# Patient Record
Sex: Female | Born: 1965 | Race: White | Hispanic: No | Marital: Married | Smoking: Never smoker
Health system: Southern US, Community
[De-identification: ages and names within clinical notes are randomized; demographics above are authoritative.]

## PROBLEM LIST (undated history)

## (undated) DIAGNOSIS — E119 Type 2 diabetes mellitus without complications: Principal | ICD-10-CM

## (undated) DIAGNOSIS — Z79890 Hormone replacement therapy (postmenopausal): Secondary | ICD-10-CM

## (undated) DIAGNOSIS — I10 Essential (primary) hypertension: Secondary | ICD-10-CM

## (undated) DIAGNOSIS — M47814 Spondylosis without myelopathy or radiculopathy, thoracic region: Principal | ICD-10-CM

## (undated) DIAGNOSIS — N3281 Overactive bladder: Secondary | ICD-10-CM

## (undated) DIAGNOSIS — E785 Hyperlipidemia, unspecified: Secondary | ICD-10-CM

## (undated) DIAGNOSIS — J45909 Unspecified asthma, uncomplicated: Secondary | ICD-10-CM

## (undated) DIAGNOSIS — T7840XA Allergy, unspecified, initial encounter: Secondary | ICD-10-CM

## (undated) DIAGNOSIS — M797 Fibromyalgia: Secondary | ICD-10-CM

## (undated) DIAGNOSIS — Z1231 Encounter for screening mammogram for malignant neoplasm of breast: Secondary | ICD-10-CM

## (undated) DIAGNOSIS — M546 Pain in thoracic spine: Principal | ICD-10-CM

## (undated) DIAGNOSIS — Z899 Acquired absence of limb, unspecified: Secondary | ICD-10-CM

## (undated) DIAGNOSIS — R32 Unspecified urinary incontinence: Secondary | ICD-10-CM

## (undated) DIAGNOSIS — G709 Myoneural disorder, unspecified: Secondary | ICD-10-CM

## (undated) DIAGNOSIS — R112 Nausea with vomiting, unspecified: Secondary | ICD-10-CM

## (undated) DIAGNOSIS — E7800 Pure hypercholesterolemia, unspecified: Secondary | ICD-10-CM

## (undated) DIAGNOSIS — Z89511 Acquired absence of right leg below knee: Secondary | ICD-10-CM

## (undated) DIAGNOSIS — Z9289 Personal history of other medical treatment: Secondary | ICD-10-CM

## (undated) DIAGNOSIS — Z9889 Other specified postprocedural states: Secondary | ICD-10-CM

## (undated) DIAGNOSIS — F419 Anxiety disorder, unspecified: Secondary | ICD-10-CM

## (undated) DIAGNOSIS — J9819 Other pulmonary collapse: Secondary | ICD-10-CM

## (undated) DIAGNOSIS — T884XXA Failed or difficult intubation, initial encounter: Secondary | ICD-10-CM

## (undated) DIAGNOSIS — M1990 Unspecified osteoarthritis, unspecified site: Secondary | ICD-10-CM

## (undated) DIAGNOSIS — J189 Pneumonia, unspecified organism: Secondary | ICD-10-CM

## (undated) DIAGNOSIS — C801 Malignant (primary) neoplasm, unspecified: Secondary | ICD-10-CM

## (undated) DIAGNOSIS — F32A Depression, unspecified: Secondary | ICD-10-CM

## (undated) DIAGNOSIS — K219 Gastro-esophageal reflux disease without esophagitis: Secondary | ICD-10-CM

## (undated) DIAGNOSIS — K449 Diaphragmatic hernia without obstruction or gangrene: Secondary | ICD-10-CM

## (undated) DIAGNOSIS — Z860100 Personal history of colon polyps, unspecified: Secondary | ICD-10-CM

## (undated) DIAGNOSIS — K760 Fatty (change of) liver, not elsewhere classified: Secondary | ICD-10-CM

## (undated) DIAGNOSIS — D649 Anemia, unspecified: Secondary | ICD-10-CM

## (undated) HISTORY — PX: IVC FILTER INSERTION: CATH118245

## (undated) HISTORY — DX: Personal history of colon polyps, unspecified: Z86.0100

## (undated) HISTORY — DX: Unspecified osteoarthritis, unspecified site: M19.90

## (undated) HISTORY — DX: Essential (primary) hypertension: I10

## (undated) HISTORY — DX: Gastro-esophageal reflux disease without esophagitis: K21.9

## (undated) HISTORY — PX: TOTAL KNEE ARTHROPLASTY: SHX125

## (undated) HISTORY — DX: Type 2 diabetes mellitus without complications: E11.9

## (undated) HISTORY — DX: Unspecified asthma, uncomplicated: J45.909

## (undated) HISTORY — DX: Anemia, unspecified: D64.9

## (undated) HISTORY — DX: Diaphragmatic hernia without obstruction or gangrene: K44.9

## (undated) HISTORY — DX: Pure hypercholesterolemia, unspecified: E78.00

## (undated) HISTORY — DX: Personal history of colonic polyps: Z86.010

## (undated) HISTORY — DX: Depression, unspecified: F32.A

---

## 2001-10-26 DIAGNOSIS — I2699 Other pulmonary embolism without acute cor pulmonale: Secondary | ICD-10-CM

## 2001-10-26 HISTORY — DX: Other pulmonary embolism without acute cor pulmonale: I26.99

## 2002-08-27 HISTORY — PX: LEG AMPUTATION BELOW KNEE: SHX694

## 2002-08-27 HISTORY — PX: ORIF ANKLE FRACTURE: SUR919

## 2010-07-22 NOTE — Telephone Encounter (Signed)
Pt needs all med rfs. [facility] [place] except-Neurontin, K+, and nexium.

## 2010-07-23 MED ORDER — SIMVASTATIN 20 MG PO TABS
20 MG | ORAL_TABLET | Freq: Every evening | ORAL | Status: DC
Start: 2010-07-23 — End: 2010-11-03

## 2010-07-23 MED ORDER — OXYBUTYNIN CHLORIDE 5 MG PO TABS
5 MG | ORAL_TABLET | Freq: Two times a day (BID) | ORAL | Status: DC
Start: 2010-07-23 — End: 2011-01-25

## 2010-07-23 MED ORDER — METFORMIN HCL 1000 MG PO TABS
1000 MG | ORAL_TABLET | Freq: Two times a day (BID) | ORAL | Status: DC
Start: 2010-07-23 — End: 2011-02-04

## 2010-07-23 MED ORDER — GABAPENTIN 600 MG PO TABS
600 MG | ORAL_TABLET | ORAL | Status: DC
Start: 2010-07-23 — End: 2011-06-01

## 2010-07-23 MED ORDER — LISINOPRIL-HYDROCHLOROTHIAZIDE 20-25 MG PO TABS
20-25 MG | ORAL_TABLET | Freq: Every day | ORAL | Status: DC
Start: 2010-07-23 — End: 2010-11-03

## 2010-07-23 MED ORDER — FUROSEMIDE 20 MG PO TABS
20 MG | ORAL_TABLET | Freq: Two times a day (BID) | ORAL | Status: DC
Start: 2010-07-23 — End: 2010-12-29

## 2010-07-23 MED ORDER — POTASSIUM CHLORIDE 10 MEQ PO TBCR
10 MEQ | ORAL_TABLET | Freq: Every day | ORAL | Status: DC
Start: 2010-07-23 — End: 2011-02-09

## 2010-07-23 MED ORDER — MELOXICAM 15 MG PO TABS
15 MG | ORAL_TABLET | Freq: Every day | ORAL | Status: DC
Start: 2010-07-23 — End: 2010-12-29

## 2010-07-23 MED ORDER — ESOMEPRAZOLE MAGNESIUM 40 MG PO CPDR
40 MG | ORAL_CAPSULE | Freq: Every day | ORAL | Status: DC
Start: 2010-07-23 — End: 2011-02-04

## 2010-07-23 MED ORDER — PROGESTERONE MICRONIZED 100 MG PO CAPS
100 MG | ORAL_CAPSULE | ORAL | Status: DC
Start: 2010-07-23 — End: 2010-09-24

## 2010-08-11 ENCOUNTER — Encounter

## 2010-08-11 MED ORDER — LORATADINE 10 MG PO TABS
10 MG | ORAL_TABLET | Freq: Every day | ORAL | Status: DC
Start: 2010-08-11 — End: 2011-02-04

## 2010-09-24 ENCOUNTER — Encounter

## 2010-09-24 NOTE — Telephone Encounter (Signed)
Pt needs trazodone and prom[facility]rium [facility]  The rx for only #12 of prom[facility]rium [facility] [place].

## 2010-09-25 MED ORDER — PROGESTERONE MICRONIZED 100 MG PO CAPS
100 MG | ORAL_CAPSULE | ORAL | Status: DC
Start: 2010-09-25 — End: 2010-12-29

## 2010-09-25 MED ORDER — TRAZODONE HCL 150 MG PO TABS
150 MG | ORAL_TABLET | Freq: Every evening | ORAL | Status: DC
Start: 2010-09-25 — End: 2014-01-04

## 2010-09-25 NOTE — Telephone Encounter (Signed)
Unable to contact pt. Her answering machine is off.  [facility]

## 2010-09-30 ENCOUNTER — Encounter

## 2010-09-30 NOTE — Progress Notes (Signed)
Pt called for mam order to be mailed to her home.  Order mailed and phone # to schedule enclosed.

## 2010-10-26 HISTORY — PX: ROTATOR CUFF REPAIR: SHX139

## 2010-11-03 ENCOUNTER — Encounter

## 2010-11-03 MED ORDER — SIMVASTATIN 20 MG PO TABS
20 MG | ORAL_TABLET | Freq: Every evening | ORAL | Status: DC
Start: 2010-11-03 — End: 2011-02-10

## 2010-11-03 MED ORDER — LISINOPRIL-HYDROCHLOROTHIAZIDE 20-25 MG PO TABS
20-25 MG | ORAL_TABLET | Freq: Every day | ORAL | Status: DC
Start: 2010-11-03 — End: 2011-06-01

## 2010-11-03 NOTE — Telephone Encounter (Signed)
Please fax to [facility],call patient when done

## 2010-11-03 NOTE — Telephone Encounter (Signed)
Marchelle Folks [facility]ed to [facility]

## 2010-11-10 NOTE — Telephone Encounter (Signed)
Tried to call pt and advised that we received a fax from [facility] stating that her Nexium that is currently rx has other alternatives that may be cheaper for her.  Scanned a copy of letter in system.

## 2010-11-26 NOTE — Telephone Encounter (Signed)
Message copied by Ursula AlertGREGORY, MICHELLE on Wed Nov 26, 2010  4:50 PM  ------       Message from: Barrett HenleGOLDSMITH, SHANNON       Created: Tue Nov 25, 2010 11:41 AM         Notify normal.  Follow up in 1 year or sooner if issue

## 2010-11-26 NOTE — Telephone Encounter (Signed)
Pt aware of xray results

## 2010-12-29 ENCOUNTER — Encounter

## 2010-12-29 MED ORDER — FUROSEMIDE 20 MG PO TABS
20 MG | ORAL_TABLET | Freq: Two times a day (BID) | ORAL | Status: DC
Start: 2010-12-29 — End: 2011-02-04

## 2010-12-29 MED ORDER — PROGESTERONE MICRONIZED 100 MG PO CAPS
100 MG | ORAL_CAPSULE | ORAL | Status: DC
Start: 2010-12-29 — End: 2011-02-04

## 2010-12-29 MED ORDER — MELOXICAM 15 MG PO TABS
15 MG | ORAL_TABLET | Freq: Every day | ORAL | Status: DC
Start: 2010-12-29 — End: 2011-02-04

## 2011-01-14 MED ORDER — HYDROCORTISONE 2.5 % RE CREA
2.5 % | RECTAL | Status: DC
Start: 2011-01-14 — End: 2011-02-04

## 2011-01-14 NOTE — Telephone Encounter (Signed)
Pt called and stated that she has a hemorrhoid that is bleeding and she wants to know if there is something that you can call in to help heal it.  ([facility])

## 2011-01-14 NOTE — Telephone Encounter (Signed)
Sent over cream that can be applied to outer rectum.  Patient needs to keep stools lose with extra fiber and water or colace (OTC stool softner) to avoid straining which will increase bleeding.  Needs to go to ER if losing a lot of blood.

## 2011-01-14 NOTE — Telephone Encounter (Signed)
Pt aware of note below, will do all of those things.

## 2011-01-25 ENCOUNTER — Encounter

## 2011-01-26 MED ORDER — OXYBUTYNIN CHLORIDE 5 MG PO TABS
5 MG | ORAL_TABLET | ORAL | Status: DC
Start: 2011-01-26 — End: 2011-02-04

## 2011-02-04 LAB — COMPREHENSIVE METABOLIC PANEL
ALT: 51 U/L (ref 0–63)
AST: 46 U/L — ABNORMAL HIGH (ref 0–35)
Albumin: 4.5 g/dL (ref 3.5–5.2)
Alk Phosphatase: 60 U/L (ref 40–135)
Anion Gap: 8 mEq/L (ref 7–13)
BUN: 17 mg/dL (ref 10–26)
CO2: 33 mEq/L — ABNORMAL HIGH (ref 22–32)
Calcium: 9.7 mg/dL (ref 8.5–10.2)
Chloride: 102 mEq/L (ref 99–111)
Creatinine: 1.1 mg/dL (ref 0.50–1.10)
GFR African American: 64.4
GFR Non-African American: 53.2
Globulin: 2.5 g/dL (ref 2.3–3.5)
Glucose: 123 mg/dL — ABNORMAL HIGH (ref 60–115)
Potassium: 4.4 mEq/L (ref 3.5–5.5)
Sodium: 143 mEq/L (ref 135–146)
Total Bilirubin: 0.3 mg/dL (ref 0.2–1.1)
Total Protein: 7 g/dL (ref 6.4–8.1)

## 2011-02-04 LAB — CBC WITH DIFFERENTIAL
Basophils %: 0.1 % (ref 0.0–2.0)
Basophils Absolute: 0 10*3/uL (ref 0.0–0.2)
Eosinophils %: 2.5 % (ref 0.0–4.0)
Eosinophils Absolute: 0.2 10*3/uL (ref 0.0–0.7)
Granulocyte Absolute Count: 4.3 10*3/uL (ref 1.4–6.5)
Granulocytes, Relative Percent: 60.2 % (ref 42.2–75.2)
HCT: 40.8 % (ref 37.0–47.0)
Hemoglobin: 13.8 g/dL (ref 12.0–16.0)
Lymphocytes %: 30.2 % (ref 20.5–51.1)
Lymphocytes Absolute: 2.1 10*3/uL (ref 1.0–4.8)
MCH: 32.3 pg — ABNORMAL HIGH (ref 27.0–31.0)
MCHC: 33.7 % (ref 33.0–37.0)
MCV: 95.7 fL (ref 82.0–100.0)
MPV: 8.2 fL (ref 7.4–10.4)
Monocytes %: 7 % (ref 2.0–11.0)
Monocytes Absolute: 0.5 10*3/uL (ref 0.2–0.8)
Platelets: 302 10*3/uL (ref 130–400)
RBC: 4.26 10*6/uL (ref 4.20–5.40)
RDW: 12.8 % (ref 11.5–14.5)
WBC: 7.1 10*3/uL (ref 4.8–10.8)

## 2011-02-04 LAB — LIPID PANEL
Chol/HDL Ratio: 4.3
Cholesterol: 166 mg/dL (ref 0–199)
HDL: 39 mg/dL — ABNORMAL LOW (ref 40–59)
LDL Calculated: 95 mg/dL (ref 0–129)
LDL/HDL Ratio: 2.4 (ref 0.0–2.9)
Triglycerides: 202 mg/dL — ABNORMAL HIGH (ref 00–149)

## 2011-02-04 LAB — TSH, HIGH SENSITIVE: TSH: 1.464 u[IU]/mL (ref 0.550–4.780)

## 2011-02-04 MED ORDER — MELOXICAM 15 MG PO TABS
15 MG | ORAL_TABLET | Freq: Every day | ORAL | Status: DC
Start: 2011-02-04 — End: 2011-06-01

## 2011-02-04 MED ORDER — TRAZODONE HCL 150 MG PO TABS
150 MG | ORAL_TABLET | Freq: Every evening | ORAL | Status: DC
Start: 2011-02-04 — End: 2012-02-01

## 2011-02-04 MED ORDER — PROGESTERONE MICRONIZED 100 MG PO CAPS
100 MG | ORAL_CAPSULE | ORAL | Status: DC
Start: 2011-02-04 — End: 2011-08-31

## 2011-02-04 MED ORDER — METFORMIN HCL 1000 MG PO TABS
1000 MG | ORAL_TABLET | Freq: Two times a day (BID) | ORAL | Status: DC
Start: 2011-02-04 — End: 2011-06-01

## 2011-02-04 MED ORDER — ESOMEPRAZOLE MAGNESIUM 40 MG PO CPDR
40 MG | ORAL_CAPSULE | Freq: Every day | ORAL | Status: DC
Start: 2011-02-04 — End: 2011-06-01

## 2011-02-04 MED ORDER — FUROSEMIDE 20 MG PO TABS
20 MG | ORAL_TABLET | Freq: Two times a day (BID) | ORAL | Status: DC
Start: 2011-02-04 — End: 2011-06-01

## 2011-02-04 MED ORDER — LORATADINE 10 MG PO TABS
10 MG | ORAL_TABLET | Freq: Every day | ORAL | Status: DC
Start: 2011-02-04 — End: 2011-09-21

## 2011-02-04 MED ORDER — OXYBUTYNIN CHLORIDE 5 MG PO TABS
5 MG | ORAL_TABLET | Freq: Two times a day (BID) | ORAL | Status: DC
Start: 2011-02-04 — End: 2011-04-26

## 2011-02-04 NOTE — Progress Notes (Signed)
Subjective:      Patient ID: Jenny Stephens is a 45 y.o. female.    Gynecologic Exam  The patient's pertinent negatives include no genital itching, genital lesions, genital odor, genital rash, pelvic pain, vaginal bleeding or vaginal discharge. Primary symptoms comment: here for routine pap She is not pregnant. Pertinent negatives include no chills, diarrhea, fever, rash or vomiting. She is sexually active. No, her partner does not have an STD. She uses vasectomy for contraception. Her menstrual history has been irregular. Her past medical history is significant for ovarian cysts. There is no history of a gynecological surgery, miscarriage or an STD.       Review of Systems   Constitutional: Negative for fever and chills.   Cardiovascular: Negative for chest pain.   Gastrointestinal: Negative for vomiting and diarrhea.   Genitourinary: Positive for enuresis. Negative for vaginal discharge, genital sores, menstrual problem and pelvic pain.   Skin: Negative for rash and wound.       Objective:   Physical Exam   [vitalsreviewed.  Constitutional: She is oriented to person, place, and time. She appears well-developed and well-nourished. No distress.   Pulmonary/Chest: Right breast exhibits no inverted nipple, no mass and no tenderness. Left breast exhibits no inverted nipple, no mass and no tenderness.   Genitourinary: Vagina normal. No breast swelling, tenderness, discharge or bleeding. Pelvic exam was performed with patient supine. There is no rash or lesion on the right labia. There is no rash or lesion on the left labia. Cervix exhibits no motion tenderness and no discharge.        Unable to palpate ovaries d/t body habitus   Neurological: She is alert and oriented to person, place, and time.       Assessment:      1. Pap smear for cervical cancer screening    2. Urinary incontinence    3. Osteoarthritis    4. Postmenopausal HRT (hormone replacement therapy)    5. HTN (hypertension)    6. Environmental allergies     7. DM type 2 (diabetes mellitus, type 2)            Plan:      Pap obtained and sent to lab  Mammogram done within the past year and normal  Patient declines cultures for STI's  Refills on chronic medications given  Routine labs and lab to check glucose done today

## 2011-02-05 LAB — HEMOGLOBIN A1C: Hemoglobin A1C: 6.4 %HbA1c — ABNORMAL HIGH (ref 4.2–5.9)

## 2011-02-09 ENCOUNTER — Encounter

## 2011-02-09 MED ORDER — POTASSIUM CHLORIDE 10 MEQ PO TBCR
10 MEQ | ORAL_TABLET | Freq: Every day | ORAL | Status: DC
Start: 2011-02-09 — End: 2011-08-31

## 2011-02-09 MED ORDER — POTASSIUM CHLORIDE 10 MEQ PO TBCR
10 MEQ | ORAL_TABLET | Freq: Every day | ORAL | Status: DC
Start: 2011-02-09 — End: 2011-02-09

## 2011-02-09 NOTE — Telephone Encounter (Signed)
Pt rx sent electronically and can not for mail order.

## 2011-02-09 NOTE — Telephone Encounter (Signed)
Pt request Klor-Con RX to be called in to [facility]: [phone]

## 2011-02-09 NOTE — Telephone Encounter (Signed)
Addended by: Ursula AlertGREGORY, MICHELLE on: 02/09/2011 11:14 AM     Modules accepted: Orders

## 2011-02-10 ENCOUNTER — Telehealth

## 2011-02-10 MED ORDER — SIMVASTATIN 40 MG PO TABS
40 MG | ORAL_TABLET | Freq: Every evening | ORAL | Status: DC
Start: 2011-02-10 — End: 2011-06-30

## 2011-02-10 NOTE — Telephone Encounter (Signed)
Pt aware of blood work results.  She wants to increase Zocor.  Will need to [facility]

## 2011-02-10 NOTE — Telephone Encounter (Signed)
Pt aware that pap is normal, repeat in 1 yr

## 2011-02-10 NOTE — Telephone Encounter (Signed)
Message copied by Ursula AlertGREGORY, MICHELLE on Tue Feb 10, 2011  1:50 PM  ------       Message from: Barrett HenleGOLDSMITH, SHANNON       Created: Mon Feb 09, 2011  7:59 AM         Notify patient that A1c is stable at 6.4.  Cholesterol is somewhat improved, however triglycerides are elevated.  Needs to improve diet if can otherwise can consider increasing Zocor.  Remaining labs ok

## 2011-02-10 NOTE — Telephone Encounter (Signed)
Message copied by Ursula AlertGREGORY, MICHELLE on Tue Feb 10, 2011  1:55 PM  ------       Message from: Barrett HenleGOLDSMITH, SHANNON       Created: Mon Feb 09, 2011  4:20 PM         Notify patient that pap is ok

## 2011-03-09 ENCOUNTER — Encounter

## 2011-03-09 MED ORDER — ALBUTEROL SULFATE HFA 108 (90 BASE) MCG/ACT IN AERS
108 (90 Base) MCG/ACT | Freq: Four times a day (QID) | RESPIRATORY_TRACT | Status: DC | PRN
Start: 2011-03-09 — End: 2012-02-10

## 2011-03-09 MED ORDER — EPINEPHRINE 0.3 MG/0.3ML IJ DEVI
0.3 MG/ML | Freq: Once | INTRAMUSCULAR | Status: DC | PRN
Start: 2011-03-09 — End: 2012-02-10

## 2011-04-26 ENCOUNTER — Encounter

## 2011-04-30 MED ORDER — OXYBUTYNIN CHLORIDE 5 MG PO TABS
5 MG | ORAL_TABLET | ORAL | Status: DC
Start: 2011-04-30 — End: 2011-08-31

## 2011-06-01 ENCOUNTER — Encounter

## 2011-06-01 MED ORDER — MELOXICAM 15 MG PO TABS
15 MG | ORAL_TABLET | Freq: Every day | ORAL | Status: DC
Start: 2011-06-01 — End: 2011-08-09

## 2011-06-01 MED ORDER — LISINOPRIL-HYDROCHLOROTHIAZIDE 20-25 MG PO TABS
20-25 MG | ORAL_TABLET | Freq: Every day | ORAL | Status: DC
Start: 2011-06-01 — End: 2012-02-10

## 2011-06-01 MED ORDER — GABAPENTIN 600 MG PO TABS
600 MG | ORAL_TABLET | ORAL | Status: DC
Start: 2011-06-01 — End: 2012-01-25

## 2011-06-01 MED ORDER — METFORMIN HCL 1000 MG PO TABS
1000 MG | ORAL_TABLET | Freq: Two times a day (BID) | ORAL | Status: DC
Start: 2011-06-01 — End: 2012-02-10

## 2011-06-01 MED ORDER — ESOMEPRAZOLE MAGNESIUM 40 MG PO CPDR
40 MG | ORAL_CAPSULE | Freq: Every day | ORAL | Status: DC
Start: 2011-06-01 — End: 2012-02-10

## 2011-06-01 MED ORDER — FUROSEMIDE 20 MG PO TABS
20 MG | ORAL_TABLET | Freq: Two times a day (BID) | ORAL | Status: DC
Start: 2011-06-01 — End: 2012-02-10

## 2011-06-01 NOTE — Telephone Encounter (Signed)
Pt aware that [facility]

## 2011-06-30 ENCOUNTER — Encounter

## 2011-06-30 MED ORDER — SIMVASTATIN 40 MG PO TABS
40 MG | ORAL_TABLET | Freq: Every evening | ORAL | Status: DC
Start: 2011-06-30 — End: 2012-02-10

## 2011-08-09 ENCOUNTER — Encounter

## 2011-08-10 MED ORDER — MELOXICAM 15 MG PO TABS
15 MG | ORAL_TABLET | ORAL | Status: DC
Start: 2011-08-10 — End: 2012-01-11

## 2011-08-10 NOTE — Telephone Encounter (Signed)
Printed out

## 2011-08-31 ENCOUNTER — Encounter

## 2011-08-31 MED ORDER — POTASSIUM CHLORIDE 10 MEQ PO TBCR
10 MEQ | ORAL_TABLET | Freq: Every day | ORAL | Status: DC
Start: 2011-08-31 — End: 2012-02-12

## 2011-08-31 MED ORDER — PROGESTERONE MICRONIZED 100 MG PO CAPS
100 MG | ORAL_CAPSULE | ORAL | Status: DC
Start: 2011-08-31 — End: 2012-02-10

## 2011-08-31 MED ORDER — OXYBUTYNIN CHLORIDE 5 MG PO TABS
5 MG | ORAL_TABLET | Freq: Two times a day (BID) | ORAL | Status: DC
Start: 2011-08-31 — End: 2012-02-01

## 2011-08-31 NOTE — Telephone Encounter (Signed)
Needs faxed

## 2011-08-31 NOTE — Telephone Encounter (Signed)
Potassium and Progesterone [facility]  Pt needs Oxybutynin sent to [facility].

## 2011-09-21 ENCOUNTER — Encounter

## 2011-09-21 MED ORDER — LORATADINE 10 MG PO TABS
10 MG | ORAL_TABLET | Freq: Every day | ORAL | Status: DC
Start: 2011-09-21 — End: 2012-04-10

## 2011-10-13 NOTE — Telephone Encounter (Signed)
Patient already has refills on requested Rx.

## 2011-11-17 ENCOUNTER — Encounter

## 2011-11-17 NOTE — Progress Notes (Signed)
Pt requesting an order for mammogram. She received a letter from [facility] that stated she was due.

## 2011-12-24 NOTE — Telephone Encounter (Signed)
Message copied by Hermenia Fiscal on Thu Dec 24, 2011  4:58 PM  ------       Message from: Barrett Henle       Created: Thu Dec 24, 2011  4:41 PM         Notify patient that mammogram is normal.  ------

## 2011-12-24 NOTE — Telephone Encounter (Signed)
Pt aware of results.

## 2012-01-06 MED ORDER — NABUMETONE 500 MG PO TABS
500 MG | ORAL_TABLET | Freq: Two times a day (BID) | ORAL | Status: DC
Start: 2012-01-06 — End: 2012-02-17

## 2012-01-06 NOTE — Telephone Encounter (Signed)
Is she still taking the Mobic?  Is she only looking for something stronger for prn use or is she looking for something different for everyday?

## 2012-01-06 NOTE — Telephone Encounter (Signed)
Stop mobic and new rx sent.

## 2012-01-06 NOTE — Telephone Encounter (Addendum)
Pt says she is looking for something different that she can use every day. She still is taking Mobic. [facility] [place] She would like to have 2 refills.

## 2012-01-06 NOTE — Telephone Encounter (Signed)
Pt aware,

## 2012-01-06 NOTE — Telephone Encounter (Signed)
Patient is having a lot of pain due to her arthritis, believes it may be due to the weather.  She was on Celebrex in the past but that stopped working after a while.

## 2012-01-11 ENCOUNTER — Encounter

## 2012-01-11 MED ORDER — MELOXICAM 15 MG PO TABS
15 MG | ORAL_TABLET | Freq: Every day | ORAL | Status: DC
Start: 2012-01-11 — End: 2012-02-10

## 2012-01-11 NOTE — Telephone Encounter (Signed)
Pt aware.

## 2012-01-11 NOTE — Telephone Encounter (Signed)
Ok stop relafen, new rx sent

## 2012-01-11 NOTE — Telephone Encounter (Signed)
Patient did not have relief with Relafen, so she is back on Cymbalta 30mg  per Dr. Stacie Acres & would like to resume the Mobic 15mg  a day.  She states she is having good pain relief with this combination.

## 2012-01-25 MED ORDER — GABAPENTIN 600 MG PO TABS
600 MG | ORAL_TABLET | ORAL | Status: DC
Start: 2012-01-25 — End: 2012-02-10

## 2012-02-01 ENCOUNTER — Encounter

## 2012-02-01 MED ORDER — OXYBUTYNIN CHLORIDE 5 MG PO TABS
5 MG | ORAL_TABLET | Freq: Two times a day (BID) | ORAL | Status: DC
Start: 2012-02-01 — End: 2012-02-10

## 2012-02-01 MED ORDER — TRAZODONE HCL 150 MG PO TABS
150 MG | ORAL_TABLET | Freq: Every evening | ORAL | Status: DC
Start: 2012-02-01 — End: 2012-09-07

## 2012-02-01 NOTE — Telephone Encounter (Signed)
Pt had to cancel her pap appt.  She states that [facility] will only pay for pap every 3 years.  She was going to get these refills at her pap appt.

## 2012-02-10 MED ORDER — AZITHROMYCIN 500 MG PO TABS
500 MG | ORAL_TABLET | Freq: Every day | ORAL | Status: AC
Start: 2012-02-10 — End: 2012-02-15

## 2012-02-10 NOTE — Telephone Encounter (Signed)
Advised pt of below.

## 2012-02-10 NOTE — Telephone Encounter (Signed)
Antibiotic sent, please apologize.  Her chart got locked by computer problems so wasn't sent earlier

## 2012-02-10 NOTE — Telephone Encounter (Signed)
Jenny Stephens called and wanted to know if you were going to send over any medication for her ears.

## 2012-02-10 NOTE — Progress Notes (Signed)
Subjective:      Patient ID: Jenny Stephens is a 46 y.o. female.    HPI Comments: Jenny Stephens is here today for medication refills; some go to [facility] and others go to [facility]. She'd also like to have her left ear looked at. She states it has felt clogged for the last 3 weeks. She hasn't tried anything other than the antihistamine she takes. She's also going to need blood work done. Her periods have "been all out of whack" and would like to have a pre-menopausal test done.     CC: diabetes, ear pain, period issues    Ear Fullness   There is pain in the left ear. This is a new problem. The current episode started 1 to 4 weeks ago. The problem occurs constantly. The problem has been unchanged. There has been no fever. Associated symptoms include drainage, hearing loss and rhinorrhea. Pertinent negatives include no coughing, ear discharge, headaches, neck pain, rash or vomiting. Treatments tried: antihistamine. The treatment provided no relief. There is no history of a chronic ear infection.     Treatment Adherence:   Medication compliance:  compliant all of the time  Diet compliance:  compliant most of the time  Weight trend: decreasing  Current exercise: limited but improving as prosthesis is getting better  What might prevent you from meeting your goal?: impairment:  physical: amputee, financial, stress and time constraints  Patient plan for overcoming barriers: N/A     Patient Confidence: 9/10      Diabetes Mellitus Type 2: Current symptoms/problems include neuropathy.    Home blood sugar records:  patient does not test  Any episodes of hypoglycemia? no  Eye exam current (within one year): yes  Tobacco history: She  reports that she has never smoked. She has never used smokeless tobacco.   Daily Aspirin? No: not indicated  Known diabetic complications: peripheral neuropathy    Hypertension:  Home blood pressure monitoring: No.  She is adherent to a low sodium diet. Patient denies chest pain, shortness of breath and  headache.  Antihypertensive medication side effects: no medication side effects noted.  Use of agents associated with hypertension: NSAIDS.   Does have peripheral edema.    Hyperlipidemia:  No new myalgias or GI upset on simvastatin (Zocor).       Lab Results   Component Value Date    LABA1C 6.4* 02/10/2012    LABA1C 6.4* 02/04/2011     Lab Results   Component Value Date    LABMICR 1.0 02/10/2012    CREATININE 0.88 02/10/2012     Lab Results   Component Value Date    ALT 41 02/10/2012    AST 40* 02/10/2012     Lab Results   Component Value Date    CHOL 140 02/10/2012    TRIG 126 02/10/2012    HDL 47 02/10/2012    LDLCALC 68 02/10/2012        Feels that hormones are of out order.  Has missed several menses.  Takes progesterone b/c of endometriosis.    Past Medical History   Diagnosis Date   . Diabetes mellitus    . Depression    . Osteoarthritis    . RAD (reactive airway disease)    . Hyperlipidemia    . GERD (gastroesophageal reflux disease)    . Allergic rhinitis    . Hypertension    . Rosacea    . Embolism - blood clot 2004     pe   .  Fibromyalgia    . Muscle pain      axial skeletal   . Overactive bladder    . PCOS (polycystic ovarian syndrome)        Review of Systems   Constitutional: Negative for fever and chills.   HENT: Positive for hearing loss, ear pain, congestion and rhinorrhea. Negative for neck pain and ear discharge.    Respiratory: Negative for cough and shortness of breath.    Cardiovascular: Positive for leg swelling. Negative for chest pain and palpitations.   Gastrointestinal: Negative for nausea and vomiting.   Genitourinary: Positive for menstrual problem. Negative for dysuria.   Musculoskeletal: Positive for myalgias, arthralgias and gait problem.   Skin: Negative for rash and wound.   Neurological: Negative for dizziness and headaches.   Psychiatric/Behavioral: Positive for dysphoric mood. The patient is nervous/anxious.        Objective:   Physical Exam   Nursing note and vitals  reviewed.  Constitutional: She is oriented to person, place, and time. She appears well-developed and well-nourished. No distress.   HENT:   Head: Normocephalic and atraumatic.   Right Ear: Tympanic membrane normal.   Left Ear: Tympanic membrane is erythematous and bulging. A middle ear effusion is present.   Nose: Nose normal.   Mouth/Throat: No oropharyngeal exudate or posterior oropharyngeal edema.   Neck: Normal range of motion. Neck supple.   Cardiovascular: Normal rate, regular rhythm and normal heart sounds.    No murmur heard.  Pulmonary/Chest: Effort normal and breath sounds normal. No respiratory distress. She has no wheezes.   Musculoskeletal: She exhibits edema (left leg).        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   Lymphadenopathy:     She has no cervical adenopathy.   Neurological: She is alert and oriented to person, place, and time.   Skin: Skin is warm and dry. No rash noted.   Psychiatric: She has a normal mood and affect. Her behavior is normal.       Assessment:      1. Diabetes mellitus    2. HTN (hypertension)    3. Otitis media    4. Irregular menses    5. Other and unspecified hyperlipidemia    6. Acid reflux    7. Overactive bladder    8. Fibromyalgia    9. Asthma             Plan:      Will draw extensive labs today  Refills given  Will call when resulted  Rx for zpak for ear  Will check hormone labs   Encouraged continued weight loss  Follow up with prosthesis   Diabetes Counseling   Patient was counseled regarding disease risks and adopting healthy behaviors. Patient was provided education materials to assist with self management. Patient was provided log (or received log during previous visit) to record blood pressure, food intake and/or blood sugar. Patient was instructed to keep log up-to-date and to always bring log to all office visits.

## 2012-02-11 LAB — CBC WITH DIFFERENTIAL
Basophils %: 0.3 %
Basophils Absolute: 0 10*3/uL (ref 0.0–0.2)
Eosinophils %: 1.7 %
Eosinophils Absolute: 0.1 10*3/uL (ref 0.0–0.7)
Hematocrit: 40.3 % (ref 37.0–47.0)
Hemoglobin: 13.3 g/dL (ref 12.0–16.0)
Lymphocytes %: 26.7 %
Lymphocytes Absolute: 2 10*3/uL (ref 1.0–4.8)
MCH: 32.1 pg — ABNORMAL HIGH (ref 27.0–31.3)
MCHC: 32.9 % — ABNORMAL LOW (ref 33.0–37.0)
MCV: 97.6 fL (ref 82.0–100.0)
MPV: 8.2 fL (ref 7.4–10.4)
Monocytes %: 9 %
Monocytes Absolute: 0.7 10*3/uL (ref 0.2–0.8)
Neutrophils %: 62.3 %
Neutrophils Absolute: 4.7 10*3/uL (ref 1.4–6.5)
Platelets: 283 10*3/uL (ref 130–400)
RBC: 4.13 M/uL — ABNORMAL LOW (ref 4.20–5.40)
RDW: 13.4 % (ref 11.5–14.5)
WBC: 7.6 10*3/uL (ref 4.8–10.8)

## 2012-02-11 LAB — COMPREHENSIVE METABOLIC PANEL
ALT: 41 U/L (ref 0–63)
AST: 40 U/L — ABNORMAL HIGH (ref 0–35)
Albumin: 4.3 g/dL (ref 3.5–5.2)
Alkaline Phosphatase: 61 U/L (ref 40–135)
Anion Gap: 12 mEq/L (ref 7–13)
BUN: 15 mg/dL (ref 10–26)
CO2: 32 mEq/L (ref 22–32)
Calcium: 9.4 mg/dL (ref 8.5–10.2)
Chloride: 101 mEq/L (ref 99–111)
Creatinine: 0.88 mg/dL (ref 0.50–1.10)
GFR African American: >60 (ref 60.0–999.0)
GFR Non-African American: >60 (ref >60)
Globulin: 2.3 g/dL (ref 2.3–3.5)
Glucose: 109 mg/dL (ref 60–115)
Potassium: 3.6 mEq/L (ref 3.5–5.5)
Sodium: 145 mEq/L (ref 135–146)
Total Bilirubin: 0.4 mg/dL (ref 0.2–1.1)
Total Protein: 6.6 g/dL (ref 6.4–8.1)

## 2012-02-11 LAB — LIPID PANEL
Cholesterol, Total: 140 mg/dL (ref 0–199)
HDL: 47 mg/dL (ref 40–59)
LDL Calculated: 68 mg/dL
Triglycerides: 126 mg/dL (ref 0–149)

## 2012-02-11 LAB — MICROALBUMIN / CREATININE URINE RATIO
Creatinine, Ur: 272.2 mg/dL
Microalbumin Creatinine Ratio: 3.7 mg/G (ref 0.0–30.0)
Microalbumin, Random Urine: 1 mg/dL

## 2012-02-11 LAB — LUTEINIZING HORMONE: LH: 53 m[IU]/mL

## 2012-02-11 LAB — TSH, HIGH SENSITIVE: TSH: 1.066 u[IU]/mL (ref 0.550–4.780)

## 2012-02-11 LAB — FOLLICLE STIMULATING HORMONE: FSH: 28.6 m[IU]/mL

## 2012-02-11 LAB — HEMOGLOBIN A1C: Hemoglobin A1C: 6.4 % — ABNORMAL HIGH (ref 4.2–5.9)

## 2012-02-11 MED ORDER — GABAPENTIN 600 MG PO TABS
600 MG | ORAL_TABLET | ORAL | Status: DC
Start: 2012-02-11 — End: 2012-12-19

## 2012-02-11 MED ORDER — SIMVASTATIN 40 MG PO TABS
40 MG | ORAL_TABLET | Freq: Every evening | ORAL | Status: DC
Start: 2012-02-11 — End: 2012-10-10

## 2012-02-11 MED ORDER — LISINOPRIL-HYDROCHLOROTHIAZIDE 20-25 MG PO TABS
20-25 MG | ORAL_TABLET | Freq: Every day | ORAL | Status: DC
Start: 2012-02-11 — End: 2012-12-19

## 2012-02-11 MED ORDER — FUROSEMIDE 20 MG PO TABS
20 MG | ORAL_TABLET | Freq: Two times a day (BID) | ORAL | Status: DC
Start: 2012-02-11 — End: 2012-10-10

## 2012-02-11 MED ORDER — ESOMEPRAZOLE MAGNESIUM 40 MG PO CPDR
40 MG | ORAL_CAPSULE | Freq: Every day | ORAL | Status: DC
Start: 2012-02-11 — End: 2012-12-19

## 2012-02-11 MED ORDER — OXYBUTYNIN CHLORIDE 5 MG PO TABS
5 MG | ORAL_TABLET | Freq: Two times a day (BID) | ORAL | Status: DC
Start: 2012-02-11 — End: 2014-03-02

## 2012-02-11 MED ORDER — METFORMIN HCL 1000 MG PO TABS
1000 MG | ORAL_TABLET | Freq: Two times a day (BID) | ORAL | Status: DC
Start: 2012-02-11 — End: 2012-11-14

## 2012-02-11 MED ORDER — ALBUTEROL SULFATE HFA 108 (90 BASE) MCG/ACT IN AERS
108 (90 Base) MCG/ACT | Freq: Four times a day (QID) | RESPIRATORY_TRACT | Status: DC | PRN
Start: 2012-02-11 — End: 2012-12-19

## 2012-02-11 MED ORDER — PROGESTERONE MICRONIZED 100 MG PO CAPS
100 MG | ORAL_CAPSULE | ORAL | Status: DC
Start: 2012-02-11 — End: 2012-12-19

## 2012-02-11 MED ORDER — MELOXICAM 15 MG PO TABS
15 MG | ORAL_TABLET | Freq: Every day | ORAL | Status: DC
Start: 2012-02-11 — End: 2012-07-17

## 2012-02-11 MED ORDER — EPINEPHRINE 0.3 MG/0.3ML IJ DEVI
0.3 MG/ML | Freq: Once | INTRAMUSCULAR | Status: DC | PRN
Start: 2012-02-11 — End: 2012-12-19

## 2012-02-11 NOTE — Telephone Encounter (Signed)
Left message with husband for pt to call office when pt available.

## 2012-02-11 NOTE — Telephone Encounter (Signed)
If no menses for 4-6 months can then try without the progesterone  Potassium is 3.6 which is toward the low end of normal.  Would recommend continue with potassium, does she want to try the liquid or stick with the pills?

## 2012-02-11 NOTE — Telephone Encounter (Signed)
Patient called back & results given.  Patient would like to know how long she needs to monitor menses.  Also would like to know if she needs to continue taking the Potassium.

## 2012-02-11 NOTE — Telephone Encounter (Signed)
Message copied by Alona Bene on Thu Feb 11, 2012  9:01 AM  ------       Message from: Barrett Henle       Created: Thu Feb 11, 2012  7:50 AM         Notify pt that labs look good.  Cholesterol showed good improvement from last year.  A1c is still 6.4 which is good.  Hormone levels do appear to be in the start of the menopause range.  She will need to monitor her menses.  In the next few months may not need the progesterone anymore.  ------

## 2012-02-12 MED ORDER — POTASSIUM CHLORIDE 20 MEQ/15ML (10%) PO LIQD
20 MEQ/15ML (10%) | Freq: Every day | ORAL | Status: DC
Start: 2012-02-12 — End: 2013-04-07

## 2012-02-12 NOTE — Telephone Encounter (Signed)
Pt would like to go with the liquid potassium, send to [facility] [place]

## 2012-02-12 NOTE — Assessment & Plan Note (Signed)
Decrease weight by 5 lbs in next 3 months

## 2012-02-12 NOTE — Telephone Encounter (Signed)
Left VM for patient regarding below information.  Asked patient to call back regarding which Potassium she would like to take.

## 2012-02-12 NOTE — Patient Instructions (Signed)
Diabetes Management    Blood Sugar Log  Day Breakfast Lunch Dinner    Before 2 hrs After Before 2 hrs After Before 2 hrs After    Time Number Time  Number Time Number Time Number Time  Number Time Number   Sun               Mon               Tues               Wed               Thurs               Fri               Sat                 1. Keep your blood sugar level (A1c) below 7.0%    If your blood sugar is ABOVE 7.0%, get an A1c test every 3 months.     If your blood sugar is BELOW 7.0%, get an A1c test every 6 months.    2. Keep your blood pressure below 130/80    Make sure your blood pressure is measured at every office visit    3. Obtain a foot exam once a year during your doctor???s visit; also, do foot exam on yourself every week or have someone do it for you     4. Keep your LDL (bad) cholesterol level below 100    Make sure your lipids are measured once a year     5. Obtain a urine test (microalbumin) once a year    6. Obtain an eye exam once a year    7. Learn to stay healthy living with Diabetes    Attend Diabetes Education course if ordered by your physician    8. Exercise and slowly progress to (brisk walking, bike riding, etc) 30 minutes 3 to 5 days a week.    9. Obtain annual flu shot    10. Obtain pneumonia shot if you have not done so  11. Refer to patient education handouts given to you today    Diabetes Management Community Resources   Organization Address Program Phone / Website   [facility] [address] [place] [address] Diabetes Self-Management  [phone]   [facility] [address] [place], [place] Diabetes Self-Management  [phone]   [facility] [place] [address] [place], [place]  Diabetes Self-Management  [phone]   [facility] [address] [place] [address] Help with Food [phone]   Partnership for Prescription Assistance n/a Medications [phone]

## 2012-02-16 MED ORDER — CLARITHROMYCIN 250 MG PO TABS
250 MG | ORAL_TABLET | Freq: Two times a day (BID) | ORAL | Status: DC
Start: 2012-02-16 — End: 2012-02-17

## 2012-02-16 NOTE — Telephone Encounter (Signed)
Pt called and stated that she is worse, she is having B/L ear pain and sore throat.  She finished all the Azithromycin that she was given.   Can she have another RX for something different?

## 2012-02-16 NOTE — Telephone Encounter (Signed)
clarithyromycin sent to pharmacy

## 2012-02-16 NOTE — Telephone Encounter (Signed)
Pt aware

## 2012-02-17 LAB — POCT RAPID STREP A: Strep A Ag: NOT DETECTED

## 2012-02-17 MED ORDER — NYSTATIN 100000 UNIT/ML MT SUSP
100000 UNIT/ML | Freq: Four times a day (QID) | OROMUCOSAL | Status: AC
Start: 2012-02-17 — End: 2012-02-24

## 2012-02-17 NOTE — Progress Notes (Signed)
Subjective:      Patient ID: Jenny Stephens is a 46 y.o. female.    HPI Comments: Sore throat - pt thinks that it is possible Thrush, symptoms include cough and sinus drainage.  Current issue has been the past 2-3 days.  Pt has not tried anything for her symptoms.     CC: sore throat  Pharyngitis   This is a new problem. The current episode started in the past 7 days. The problem has been gradually worsening. There has been no fever. The pain is moderate. Associated symptoms include congestion, ear pain, swollen glands and trouble swallowing. Pertinent negatives include no coughing or vomiting. Treatments tried: took zpak but hasn't started clindamycin yet. The treatment provided no relief.       Review of Systems   Constitutional: Negative for fever and chills.   HENT: Positive for ear pain, congestion, mouth sores and trouble swallowing.    Respiratory: Negative for cough and wheezing.    Gastrointestinal: Negative for nausea and vomiting.       Objective:   Physical Exam   Nursing note and vitals reviewed.  Constitutional: She appears well-developed and well-nourished. No distress.   HENT:   Head: Normocephalic and atraumatic.   Right Ear: Tympanic membrane normal.   Left Ear: Tympanic membrane normal.   Nose: Nose normal.   Mouth/Throat: Oropharyngeal exudate and posterior oropharyngeal erythema present.   White fissured tongue   Cardiovascular: Normal rate, regular rhythm and normal heart sounds.    Pulmonary/Chest: Effort normal and breath sounds normal. No respiratory distress.       Assessment:      1. Sore throat    2. Thrush             Plan:      Advised to start clindamycin  Rx for nystatin   Rest, increase fluids, OTC symptomatic medications  The patient is to call or return if symptoms worsen, persist, or do not resolve.  If the patient has any worsening of symptoms after hours, they are advised to go to the nearest emergency room.

## 2012-02-18 NOTE — Patient Instructions (Signed)
Candidiasis: After Your Visit  Your Care Instructions  Candidiasis (say "kan-dih-DY-uh-sus") is a yeast infection. Yeast normally lives in your body, but it can cause health problems when your body's defenses fail. You can get a yeast infection when you take antibiotics. Antibiotics kill good bacteria that control yeast. Some medicines such as steroids and cancer drugs can also increase the chance of a yeast infection. Diseases such as AIDS or diabetes, which weaken your immune system, can allow yeast to grow rapidly.  Thrush is a yeast infection in the mouth. These infections usually occur in people with weak immune systems. They cause white patches inside the mouth and throat.  Yeast infections of the skin usually occur in folds where the skin stays moist. Babies can get yeast infections under the diaper. People who often wear gloves can get yeast infections on their hands. Yeast infections cause red, weeping patches on your skin.  Vaginal yeast infections are common in women, especially during antibiotic treatment. They cause itching, white curd-like discharge, and burning around the vagina.  Rarely, yeast infects the blood and causes serious disease. A yeast infection of the blood is treated with medicine given through a needle into a vein.  A yeast infection usually goes away quickly once treatment starts. But if your immune system is weak, the infection may return. Tell your doctor if you get yeast infections often.  Follow-up care is a key part of your treatment and safety. Be sure to make and go to all appointments, and call your doctor if you are having problems. It???s also a good idea to know your test results and keep a list of the medicines you take.  How can you care for yourself at home?  ?? Take your medicines exactly as prescribed. Call your doctor if you think you are having a problem with your medicine.  ?? Use antibiotics only as directed by your doctor.  ?? Eat yogurt with live cultures. The  bacteria (lactobacillus) in the yogurt may help prevent some types of yeast infections.  ?? Keep your skin clean and dry. Sprinkle powder on areas that tend to remain moist.  ?? Use a different method of birth control than a condom or diaphragm while treating a vaginal yeast infection with a cream or suppository. These can weaken the rubber used in diaphragms and condoms.  ?? Eat a healthy diet and get regular exercise to keep your immune system strong.  When should you call for help?  Call your doctor now or seek immediate medical care if:  ?? You have a fever.  ?? You are pregnant and have signs of a vaginal or urinary tract infection such as:  ?? Severe itching in your vagina.  ?? Pain during sex or while urinating.  ?? Unusual discharge from your vagina.  ?? A frequent urge to urinate.  ?? Urine that is cloudy or smells bad.  Watch closely for changes in your health, and be sure to contact your doctor if:  ?? You do not get better as expected.    Where can you learn more?    Go to [email or web address] and sign in to your MyChart account.    Enter N733 in the Search Health Information box to learn more about ???Candidiasis: After Your Visit.???    If you do not have an account, please click on the ???Sign Up Now??? link.    ?? 2006-2012 [employer]. Care instructions adapted under license by [facility]. This care instruction is for   use with your licensed healthcare professional. If you have questions about a medical condition or this instruction, always ask your healthcare professional. [employer] disclaims any warranty or liability for your use of this information.  Content Version: 9.4.94723; Last Revised: August 18, 2010

## 2012-03-01 MED ORDER — CYCLOBENZAPRINE HCL 10 MG PO TABS
10 MG | ORAL_TABLET | Freq: Three times a day (TID) | ORAL | Status: AC | PRN
Start: 2012-03-01 — End: 2012-03-11

## 2012-03-01 MED ORDER — ACETAMINOPHEN-CODEINE 300-30 MG PO TABS
300-30 MG | ORAL_TABLET | ORAL | Status: DC | PRN
Start: 2012-03-01 — End: 2012-09-07

## 2012-03-01 NOTE — Progress Notes (Signed)
Subjective:      Patient ID: Jenny Stephens is a 46 y.o. female.    HPI Comments: Pt is here today for back pain.  States that on May 3rd, 2013 she was getting out of the car and twisted wrong and heard a pop on the right side of her lower back.   States since then she is not able to bend over. Pain starts at the right hip area.  Pt states that the pain does seem to radiate up her right side.  Pt states that she has tried ice, Bengay, and Lidoderm patches with no relief.     Pt would like a note for work on the number of days she needs off.    CC: back pain    Back Pain  This is a new problem. The current episode started in the past 7 days. The problem occurs constantly. The problem is unchanged. The pain is present in the lumbar spine and gluteal. The quality of the pain is described as burning. The pain does not radiate. The pain is worse during the day. The symptoms are aggravated by position, bending, sitting and standing. Stiffness is present in the morning. Associated symptoms include weakness. Pertinent negatives include no bladder incontinence, bowel incontinence, fever, leg pain, numbness or tingling. Risk factors include obesity (h/o leg amputation). Treatments tried: lidoderm, bengay. The treatment provided no relief.       Review of Systems   Constitutional: Negative for fever and chills.   Gastrointestinal: Negative for diarrhea, constipation and bowel incontinence.   Genitourinary: Negative for bladder incontinence.   Musculoskeletal: Positive for myalgias, back pain and gait problem.   Skin: Negative for rash and wound.   Neurological: Positive for weakness. Negative for tingling and numbness.       Objective:   Physical Exam   Nursing note and vitals reviewed.  Constitutional: She is oriented to person, place, and time. She appears well-developed and well-nourished. No distress.   HENT:   Head: Normocephalic and atraumatic.   Musculoskeletal:        Lumbar back: She exhibits decreased range of  motion, tenderness and spasm (on right).   Right below knee amputee   Tenderness right PSIS   Neurological: She is alert and oriented to person, place, and time.   Skin: Skin is warm and dry. No rash noted.       Assessment:      1. Strain of lumbar paraspinal muscle             Plan:      Continue with mobic  Rx for flexeril prn; do not drive while using  Rx for tylenol #3 for prn use; do not drive while using  F/u with chiropractor prn   Note for off work til Saturday  The patient is to call or return if symptoms worsen, persist, or do not resolve.  If the patient has any worsening of symptoms after hours, they are advised to go to the nearest emergency room.

## 2012-03-01 NOTE — Patient Instructions (Signed)
Acute Low Back Pain: Exercises  Your Care Instructions  Here are some examples of typical rehabilitation exercises for your condition. Start each exercise slowly. Ease off the exercise if you start to have pain.  Your doctor or physical therapist will tell you when you can start these exercises and which ones will work best for you.  When you are not being active, find a comfortable position for rest. Some people are comfortable on the floor or a medium-firm bed with a small pillow under their head and another under their knees. Some people prefer to lie on their side with a pillow between their knees. Don't stay in one position for too long.  Take short walks (10 to 20 minutes) every 2 to 3 hours. Avoid slopes, hills, and stairs until you feel better. Walk only distances you can manage without pain, especially leg pain.  How to do the exercises  Back stretches    1. Get down on your hands and knees on the floor.  2. Relax your head and allow it to droop. Round your back up toward the ceiling until you feel a nice stretch in your upper, middle, and lower back. Hold this stretch for as long as it feels comfortable, or about 15 to 30 seconds.  3. Return to the starting position with a flat back while you are on your hands and knees.  4. Let your back sway by pressing your stomach toward the floor. Lift your buttocks toward the ceiling.  5. Hold this position for 15 to 30 seconds.  6. Repeat 2 to 4 times.  Follow-up care is a key part of your treatment and safety. Be sure to make and go to all appointments, and call your doctor if you are having problems. It's also a good idea to know your test results and keep a list of the medicines you take.    Where can you learn more?    Go to [email or web address] and sign in to your MyChart account.    Enter Z071 in the Search Health Information box to learn more about ???Acute Low Back Pain: Exercises.???    If you do not have an account, please click on the  ???Sign Up Now??? link.    ?? 2006-2012 [employer]. Care instructions adapted under license by [facility]. This care instruction is for use with your licensed healthcare professional. If you have questions about a medical condition or this instruction, always ask your healthcare professional. Healthwise, Incorporated disclaims any warranty or liability for your use of this information.  Content Version: 9.4.94723; Last Revised: September 09, 2010

## 2012-04-11 NOTE — Telephone Encounter (Signed)
Medication refill

## 2012-04-12 MED ORDER — LORATADINE 10 MG PO TABS
10 MG | ORAL_TABLET | ORAL | Status: DC
Start: 2012-04-12 — End: 2012-10-10

## 2012-07-18 MED ORDER — MELOXICAM 15 MG PO TABS
15 MG | ORAL_TABLET | ORAL | Status: DC
Start: 2012-07-18 — End: 2012-09-07

## 2012-07-18 NOTE — Telephone Encounter (Signed)
Pt called office requesting a refill for mobic

## 2012-07-20 MED ORDER — NABUMETONE 500 MG PO TABS
500 MG | ORAL_TABLET | Freq: Two times a day (BID) | ORAL | Status: DC
Start: 2012-07-20 — End: 2012-09-07

## 2012-07-20 NOTE — Telephone Encounter (Signed)
Left vm.

## 2012-07-20 NOTE — Telephone Encounter (Signed)
mobic does not come in stronger dose and max dose for a day is 15 mg.  Sent over different rx for relafen which can be taken 2 times a day.

## 2012-07-20 NOTE — Telephone Encounter (Signed)
Left message for pt to call office.

## 2012-07-20 NOTE — Telephone Encounter (Signed)
Pt called to ask if a prescription for mobic could be prescribed in a stronger dose than 15mg .  She was just given an rx for mobic 15mg  on 07/17/12, which she has not filled yet. If its not available in stronger dosage, pt is asking if she can take two of the mobic or can she have a different rx. Please call pt at work at the [employer] before 5 or at home afterward.

## 2012-08-15 MED ORDER — CELECOXIB 100 MG PO CAPS
100 MG | ORAL_CAPSULE | Freq: Two times a day (BID) | ORAL | Status: DC
Start: 2012-08-15 — End: 2012-08-31

## 2012-08-15 NOTE — Telephone Encounter (Signed)
Pt called office and was made aware to stop mobin and ralafin and that celabrex was called into the pharmacy

## 2012-08-15 NOTE — Telephone Encounter (Signed)
rx for celebrex  Stop mobic and stop relafen that was called in last week.

## 2012-08-15 NOTE — Telephone Encounter (Signed)
Pt called office stating the mobic was not helping her anymore and was wondering if she could try the celebrex again?

## 2012-08-31 MED ORDER — CELECOXIB 200 MG PO CAPS
200 MG | ORAL_CAPSULE | Freq: Every day | ORAL | Status: DC
Start: 2012-08-31 — End: 2012-09-07

## 2012-08-31 NOTE — Telephone Encounter (Signed)
Pt called asking if she can increase celebrex to 200mg .  She has been having increased pain and discomfort

## 2012-08-31 NOTE — Telephone Encounter (Signed)
Yes, rx sent

## 2012-08-31 NOTE — Telephone Encounter (Signed)
Pt's husband is aware.

## 2012-09-05 NOTE — Telephone Encounter (Signed)
Can take 200 mg bid if flared but routine daily dose is recommended to be 200 mg only

## 2012-09-05 NOTE — Telephone Encounter (Signed)
My mistake, pt wanted to know if she can take 200 mg twice daily.  She was already taking 100 mg twice daily.

## 2012-09-05 NOTE — Telephone Encounter (Signed)
Pt says that she is taking 200 mg celebrex bid now for 5 days and is feeling much better. She is afraid if she goes back to 200 mg daily it will not help her. Her new rx is 200 mg daily. Before she was taking 100 mg in the morning and 100 in the evening. Pt would like to know if its ok to continue taking 200 mg bid daily. She says that a former physician prescribed it for her at 200 mg bid daily.

## 2012-09-06 NOTE — Telephone Encounter (Signed)
Tried to call pt and advised below and her machine is off.  Tried to call work number and number rings busy.

## 2012-09-06 NOTE — Telephone Encounter (Signed)
200 mg bid is the absolute max she can take.  Must be on look out for signs of bleeding or blood in stool

## 2012-09-07 MED ORDER — CELECOXIB 200 MG PO CAPS
200 MG | ORAL_CAPSULE | Freq: Two times a day (BID) | ORAL | Status: DC
Start: 2012-09-07 — End: 2012-10-12

## 2012-09-07 NOTE — Progress Notes (Signed)
Subjective:      Patient ID: Jenny Stephens is a 46 y.o. female.    HPI Comments: Pt is here today to discuss her medications.  Pt states she would like to talk about the arthritis  medication.  Pt states that she looked up in a prescription pill book and it said she could take 100 to 200 mg once or twice a day.  She is not sure if the guide lines changed or not because the book is 51-68 years old.  (Celebrex)  Pt states that she was taking 200 mg bid and she feels 95 to 100% better taking the Celebrex that way.    Pt has no other questions or concerns. jb    CC: arthritis    Joint/Muscle Pain: Patient complains of arthralgias and myalgias for which has been present for several years but seem to be worse the past few months. Pain is located in multiple joints, is described as aching, throbbing and tingling, and is constant .  Associated symptoms include: decreased range of motion, deformity and tenderness.  The patient has tried celebrex for pain, with moderate relief.  Related to injury:   No.  Wants to know if can increase celebrex.  Nothing else is helping.    Review of Systems   Constitutional: Negative for fever and chills.   Cardiovascular: Negative for chest pain and leg swelling.   Musculoskeletal: Positive for myalgias, arthralgias and gait problem.   Skin: Negative for rash and wound.   Neurological: Positive for numbness. Negative for weakness.       Objective:   Physical Exam   Vitals reviewed.  Constitutional: She appears well-developed and well-nourished. No distress.   Cardiovascular: Normal rate and regular rhythm.    Pulmonary/Chest: Effort normal and breath sounds normal.   Musculoskeletal: She exhibits tenderness. She exhibits no edema.   amputee       BP 98/72   Pulse 88   Temp(Src) 98 ??F (36.7 ??C) (Oral)   Wt 268 lb (121.564 kg)   BMI 45.98 kg/m2   LMP 09/07/2012    Assessment:      1. Osteoarthritis    2. Fibromyalgia             Plan:      Rx for celebrex at max dose  Advised pt of risk of GI  bleeding, upset  Return in 3 months for CBC to monitor  The patient is to call or return if symptoms worsen, persist, or do not resolve.  If the patient has any worsening of symptoms after hours, they are advised to go to the nearest emergency room.

## 2012-09-08 NOTE — Telephone Encounter (Signed)
Pt  Came in on 09/07/2012 to discuss

## 2012-09-10 NOTE — Patient Instructions (Signed)
Arthritis: After Your Visit  Your Care Instructions  Arthritis, also called osteoarthritis, is a breakdown of the cartilage that cushions your joints. When the cartilage wears down, your bones rub against each other. This causes pain and stiffness. Many people have some arthritis as they age. Arthritis most often affects the joints of the spine, hands, hips, knees, or feet.  You can take simple measures to protect your joints, ease your pain, and help you stay active.  Follow-up care is a key part of your treatment and safety. Be sure to make and go to all appointments, and call your doctor if you are having problems. It's also a good idea to know your test results and keep a list of the medicines you take.  How can you care for yourself at home?  ?? Stay at a healthy weight. Being overweight puts extra strain on your joints.  ?? Talk to your doctor or physical therapist about exercises that will help ease joint pain.  ?? Stretch. You may enjoy gentle forms of yoga to help keep your joints and muscles flexible.  ?? Walk instead of jog. Other types of exercise that are less stressful on the joints include riding a bicycle, swimming, or water exercise.  ?? Lift weights. Strong muscles help reduce stress on your joints. Stronger thigh muscles, for example, take some of the stress off of the knees and hips. Learn the right way to lift weights so you do not make joint pain worse.  ?? Take your medicines exactly as prescribed. Call your doctor if you think you are having a problem with your medicine.  ?? Take pain medicines exactly as directed.  ?? If the doctor gave you a prescription medicine for pain, take it as prescribed.  ?? If you are not taking a prescription pain medicine, ask your doctor if you can take an over-the-counter medicine.  ?? Use a cane, crutch, walker, or another device if you need help to get around. These can help rest your joints. You also can use other things to make life easier, such as a higher toilet  seat and padded handles on kitchen utensils.  ?? Do not sit in low chairs, which can make it hard to get up.  ?? Put heat or cold on your sore joints as needed. Use whichever helps you most. You also can take turns with hot and cold packs.  ?? Apply heat 2 or 3 times a day for 20 to 30 minutes--using a heating pad, hot shower, or hot pack--to relieve pain and stiffness.  ?? Put ice or a cold pack on your sore joint for 10 to 20 minutes at a time. Put a thin cloth between the ice and your skin.  ?? Your doctor may suggest you try the supplements glucosamine and chondroitin. They are part of what makes up normal cartilage. Tell your doctor if you have diabetes, allergies to shellfish, or if you take warfarin (Coumadin).  When should you call for help?  Call your doctor now or seek immediate medical care if:  ?? You have sudden swelling, warmth, or pain in any joint.  ?? You have joint pain and a fever or rash.  ?? You have such bad pain that you cannot use a joint.  Watch closely for changes in your health, and be sure to contact your doctor if:  ?? You have mild joint symptoms that continue even with more than 6 weeks of care at home.  ?? You have stomach   pain or other problems with your medicine.    Where can you learn more?    Go to [email or web address] and sign in to your MyChart account. Enter Z807 in the Search Health Information box to learn more about ???Arthritis: After Your Visit.???    If you do not have an account, please click on the ???Sign Up Now??? link.      ?? 2006-2013 [employer], [employer]. Care instructions adapted under license by [facility]. This care instruction is for use with your licensed healthcare professional. If you have questions about a medical condition or this instruction, always ask your healthcare professional. [employer], [employer] disclaims any warranty or liability for your use of this information.  Content Version: 9.7.130178; Last Revised: February 03, 2010

## 2012-10-04 NOTE — Telephone Encounter (Signed)
Received an incoming fax for Dr Goldsmith review, fax attached to this encounter.

## 2012-10-06 ENCOUNTER — Telehealth

## 2012-10-06 NOTE — Telephone Encounter (Signed)
Error

## 2012-10-06 NOTE — Telephone Encounter (Signed)
Patient called and stated that she needs a referral to a new amputee Doctor, she [facility] Ph# is [phone] and the fax # is [phone]. She does not have an appointment scheduled yet.

## 2012-10-07 NOTE — Telephone Encounter (Signed)
Referral faxed.

## 2012-10-10 ENCOUNTER — Encounter

## 2012-10-10 MED ORDER — TRAZODONE HCL 150 MG PO TABS
150 MG | ORAL_TABLET | Freq: Every evening | ORAL | Status: DC
Start: 2012-10-10 — End: 2014-03-02

## 2012-10-10 MED ORDER — LORATADINE 10 MG PO TABS
10 MG | ORAL_TABLET | ORAL | Status: DC
Start: 2012-10-10 — End: 2013-07-04

## 2012-10-10 MED ORDER — SIMVASTATIN 40 MG PO TABS
40 MG | ORAL_TABLET | Freq: Every evening | ORAL | Status: DC
Start: 2012-10-10 — End: 2012-12-19

## 2012-10-10 MED ORDER — FUROSEMIDE 20 MG PO TABS
20 MG | ORAL_TABLET | Freq: Two times a day (BID) | ORAL | Status: DC
Start: 2012-10-10 — End: 2013-06-09

## 2012-10-10 NOTE — Telephone Encounter (Signed)
Patient called for refills on 4 of [facility] [place].

## 2012-10-10 NOTE — Telephone Encounter (Signed)
Rx sent

## 2012-10-12 MED ORDER — CELECOXIB 200 MG PO CAPS
200 MG | ORAL_CAPSULE | Freq: Two times a day (BID) | ORAL | Status: DC
Start: 2012-10-12 — End: 2013-07-04

## 2012-10-12 NOTE — Telephone Encounter (Signed)
Patient called for refill on [facility], [facility]

## 2012-10-12 NOTE — Telephone Encounter (Signed)
Rx sent

## 2012-11-14 ENCOUNTER — Encounter

## 2012-11-14 MED ORDER — METFORMIN HCL 1000 MG PO TABS
1000 MG | ORAL_TABLET | Freq: Two times a day (BID) | ORAL | Status: DC
Start: 2012-11-14 — End: 2012-12-19

## 2012-11-14 NOTE — Telephone Encounter (Signed)
Rx sent

## 2012-11-14 NOTE — Telephone Encounter (Signed)
Pt requesting refill on metformin. Send to [facility]. She made an appt for blood work.

## 2012-12-13 NOTE — Progress Notes (Signed)
Pt here for fasting blood work, she has an appt scheduled to discuss results and get med refills next Monday.

## 2012-12-14 LAB — CBC WITH DIFFERENTIAL
Basophils %: 0.7 %
Basophils Absolute: 0 10*3/uL (ref 0.0–0.2)
Eosinophils %: 2.6 %
Eosinophils Absolute: 0.2 10*3/uL (ref 0.0–0.7)
Hematocrit: 39.3 % (ref 37.0–47.0)
Hemoglobin: 12.9 g/dL (ref 12.0–16.0)
Lymphocytes %: 29.1 %
Lymphocytes Absolute: 2 10*3/uL (ref 1.0–4.8)
MCH: 30.7 pg (ref 27.0–31.3)
MCHC: 32.8 % — ABNORMAL LOW (ref 33.0–37.0)
MCV: 93.5 fL (ref 82.0–100.0)
MPV: 8.6 fL (ref 7.4–10.4)
Monocytes %: 6.7 %
Monocytes Absolute: 0.5 10*3/uL (ref 0.2–0.8)
Neutrophils %: 60.9 %
Neutrophils Absolute: 4.1 10*3/uL (ref 1.4–6.5)
Platelets: 275 10*3/uL (ref 130–400)
RBC: 4.2 M/uL (ref 4.20–5.40)
RDW: 14.1 % (ref 11.5–14.5)
WBC: 6.8 10*3/uL (ref 4.8–10.8)

## 2012-12-14 LAB — HEMOGLOBIN A1C: Hemoglobin A1C: 6.6 % — ABNORMAL HIGH (ref 4.8–5.9)

## 2012-12-14 LAB — COMPREHENSIVE METABOLIC PANEL
ALT: 35 U/L — ABNORMAL HIGH (ref 0–33)
AST: 41 U/L — ABNORMAL HIGH (ref 0–32)
Albumin: 4 g/dL (ref 3.9–4.9)
Alkaline Phosphatase: 60 U/L (ref 35–104)
Anion Gap: 13 mEq/L (ref 7–13)
BUN: 14 mg/dL (ref 6–20)
CO2: 31 mEq/L — ABNORMAL HIGH (ref 22–29)
Calcium: 9.1 mg/dL (ref 8.6–10.2)
Chloride: 95 mEq/L — ABNORMAL LOW (ref 98–107)
Creatinine: 0.74 mg/dL (ref 0.50–0.90)
GFR African American: >60 (ref >60)
GFR Non-African American: >60 (ref >60)
Globulin: 2.2 g/dL — ABNORMAL LOW (ref 2.3–3.5)
Glucose: 90 mg/dL (ref 74–109)
Potassium: 3.5 mEq/L (ref 3.5–5.1)
Sodium: 139 mEq/L (ref 136–145)
Total Bilirubin: 0.4 mg/dL (ref 0.0–1.2)
Total Protein: 6.2 g/dL — ABNORMAL LOW (ref 6.4–8.3)

## 2012-12-14 LAB — LIPID PANEL
Cholesterol, Total: 135 mg/dL (ref 0–199)
HDL: 53 mg/dL
LDL Calculated: 60 mg/dL (ref 0–129)
Triglycerides: 110 mg/dL (ref 0–200)

## 2012-12-14 NOTE — Progress Notes (Signed)
Will discuss at appt next week.

## 2012-12-19 MED ORDER — PROGESTERONE MICRONIZED 100 MG PO CAPS
100 MG | ORAL_CAPSULE | ORAL | Status: DC
Start: 2012-12-19 — End: 2013-01-23

## 2012-12-19 MED ORDER — EPINEPHRINE 0.3 MG/0.3ML IJ DEVI
0.3 MG/ML | Freq: Once | INTRAMUSCULAR | Status: DC | PRN
Start: 2012-12-19 — End: 2013-12-08

## 2012-12-19 MED ORDER — OXYBUTYNIN CHLORIDE 5 MG PO TABS
5 MG | ORAL_TABLET | Freq: Two times a day (BID) | ORAL | Status: DC
Start: 2012-12-19 — End: 2013-06-12

## 2012-12-19 MED ORDER — ESOMEPRAZOLE MAGNESIUM 40 MG PO CPDR
40 MG | ORAL_CAPSULE | Freq: Every day | ORAL | Status: DC
Start: 2012-12-19 — End: 2014-01-04

## 2012-12-19 MED ORDER — ALBUTEROL SULFATE HFA 108 (90 BASE) MCG/ACT IN AERS
108 (90 Base) MCG/ACT | Freq: Four times a day (QID) | RESPIRATORY_TRACT | Status: DC | PRN
Start: 2012-12-19 — End: 2013-12-08

## 2012-12-19 MED ORDER — METFORMIN HCL 1000 MG PO TABS
1000 MG | ORAL_TABLET | Freq: Two times a day (BID) | ORAL | Status: DC
Start: 2012-12-19 — End: 2013-05-04

## 2012-12-19 MED ORDER — GABAPENTIN 600 MG PO TABS
600 MG | ORAL_TABLET | ORAL | Status: DC
Start: 2012-12-19 — End: 2013-11-07

## 2012-12-19 MED ORDER — SIMVASTATIN 40 MG PO TABS
40 MG | ORAL_TABLET | Freq: Every evening | ORAL | Status: DC
Start: 2012-12-19 — End: 2013-06-11

## 2012-12-19 MED ORDER — LISINOPRIL-HYDROCHLOROTHIAZIDE 20-25 MG PO TABS
20-25 MG | ORAL_TABLET | Freq: Every day | ORAL | Status: DC
Start: 2012-12-19 — End: 2013-06-01

## 2012-12-19 NOTE — Progress Notes (Signed)
Subjective:      Patient ID: Jenny Stephens is a 47 y.o. female.    HPI Comments: Treatment Adherence: Diabetes, discuss labs & medication refills.    CC: diabetes, htn  Medication compliance:  compliant all of the time  Diet compliance:  compliant most of the time  Weight trend: decreasing  Current exercise: swimming  What might prevent you from meeting your goal?: none  Patient plan for overcoming barriers: N/A     Patient Confidence: 8/10      Diabetes Mellitus Type 2: Current symptoms/problems include none.    Home blood sugar records:  patient does not test  Any episodes of hypoglycemia? no  Eye exam current (within one year): yes  Tobacco history: She  reports that she has never smoked. She has never used smokeless tobacco.   Daily Aspirin? No: n/a  Known diabetic complications: none    Hypertension:  Home blood pressure monitoring: No.  She is adherent to a low sodium diet. Patient denies chest pain, shortness of breath and headache.  Antihypertensive medication side effects: no medication side effects noted.  Use of agents associated with hypertension: none.  Occasionally has dizziness in am.  Usually takes both lasix and lisinopril-hctz together first thing in the morning.    Hyperlipidemia:  No new myalgias or GI upset on simvastatin (Zocor).       LABA1C      6.6   12/13/2012  LABA1C      6.4   02/10/2012  LABA1C      6.4   02/04/2011  LABMICR         1.0   02/10/2012  CREATININE     0.74   12/13/2012  ALT       35   12/13/2012  AST       41   12/13/2012  CHOL         135   12/13/2012  TRIG         110   12/13/2012  HDL           53   12/13/2012  LDLCALC       60   12/13/2012        Review of Systems   Constitutional: Negative for fever and chills.   Respiratory: Negative for cough and shortness of breath.    Cardiovascular: Negative for chest pain and leg swelling.   Gastrointestinal: Negative for nausea and vomiting.   Musculoskeletal: Positive for arthralgias and gait problem.       Objective:   Physical Exam    Vitals reviewed.  Constitutional: She is oriented to person, place, and time. She appears well-developed and well-nourished. No distress.   Cardiovascular: Normal rate, regular rhythm and normal heart sounds.    No murmur heard.  Pulmonary/Chest: Effort normal and breath sounds normal. No respiratory distress. She has no wheezes.   Musculoskeletal: She exhibits no edema.   amputee   Neurological: She is alert and oriented to person, place, and time.       BP 112/80   Pulse 96   Temp(Src) 98.3 ??F (36.8 ??C) (Oral)   Resp 16   Wt 263 lb (119.296 kg)   BMI 45.12 kg/m2   LMP 12/12/2012    Assessment:      1. Diabetes mellitus    2. HTN (hypertension)    3. Other and unspecified hyperlipidemia    4. Asthma    5. Acid reflux    6. Overactive bladder  7. Other screening mammogram             Plan:      Reviewed abnormal labs 12-13-12  Will monitor closely increasing A1c and liver enzymes with repeat testing in 6 months  Refill current medications  Try taking lisinopril-hctz at night instead  Mammogram ordered  Diabetes Counseling   Patient was again counseled regarding disease risks and need to adopt and maintain healthy behaviors, and was provided education materials to assist with self management. These included logs to record blood pressure, caloric intake and/or blood sugar (some given at previous visits). Patient was instructed to keep logs up-to-date and to always bring logs to all office visits.

## 2012-12-19 NOTE — Patient Instructions (Signed)
Diabetes Management    Blood Sugar Log  Day Breakfast Lunch Dinner    Before 2 hrs After Before 2 hrs After Before 2 hrs After    Time Number Time  Number Time Number Time Number Time  Number Time Number   Sun               Mon               Tues               Wed               Thurs               Fri               Sat                 1. Keep your blood sugar level (A1c) below 7.0%    If your blood sugar is ABOVE 7.0%, get an A1c test every 3 months.     If your blood sugar is BELOW 7.0%, get an A1c test every 6 months.    2. Keep your blood pressure below 130/80    Make sure your blood pressure is measured at every office visit    3. Obtain a foot exam once a year during your doctor???s visit; also, do foot exam on yourself every week or have someone do it for you     4. Keep your LDL (bad) cholesterol level below 100    Make sure your lipids are measured once a year     5. Obtain a urine test (microalbumin) once a year    6. Obtain an eye exam once a year    7. Learn to stay healthy living with Diabetes    Attend Diabetes Education course if ordered by your physician    8. Exercise and slowly progress to (brisk walking, bike riding, etc) 30 minutes 3 to 5 days a week.    9. Obtain annual flu shot    10. Obtain pneumonia shot if you have not done so  11. Refer to patient education handouts given to you today    Diabetes Management Community Resources   Organization Address Program Phone / Website   [facility] [address] [place] [address] Diabetes Self-Management  [phone]   [facility] [address] [place], [place] Diabetes Self-Management  [phone]   [facility] [place] [address] [place], [place]  Diabetes Self-Management  [phone]   [facility] [address] [place] [address] Help with Food [phone]   [facility] for Prescription Assistance n/a Medications [phone]

## 2013-01-23 MED ORDER — PROGESTERONE MICRONIZED 100 MG PO CAPS
100 MG | ORAL_CAPSULE | ORAL | Status: DC
Start: 2013-01-23 — End: 2013-10-03

## 2013-01-23 NOTE — Telephone Encounter (Signed)
Rx sent

## 2013-01-23 NOTE — Telephone Encounter (Signed)
Received an incoming fax for Dr Goldsmith review, fax attached to this encounter.

## 2013-04-07 MED ORDER — LORCASERIN HCL 10 MG PO TABS
10 MG | ORAL_TABLET | ORAL | Status: DC
Start: 2013-04-07 — End: 2013-04-07

## 2013-04-07 MED ORDER — PHENTERMINE HCL 37.5 MG PO TABS
37.5 MG | ORAL_TABLET | Freq: Every day | ORAL | Status: DC
Start: 2013-04-07 — End: 2013-05-04

## 2013-04-07 NOTE — Progress Notes (Signed)
Subjective:      Patient ID: Jenny Stephens is a 47 y.o. female.    HPI Comments: Pt would like to discuss belviq to help with weight loss, lower sugar , bp and cholesterol.     Pt has no other questions or concerns. sy    CC: weight    OBESITY: Patient has concerns re: their weight.  Patient has always had weight issues.  Current diet is Moderately healthy.  Mainly drinks water.  Activity level: recently started swimming again.  Has tried alli in the past to lose weight.  Patient wondering what other options are available for weight loss.  Medical co-morbidities include: Type 2 Diabetes Mellitus, Hypertension and Hyperlipidemia.    Review of Systems   Constitutional: Negative for fever and chills.   Respiratory: Negative for cough and shortness of breath.    Cardiovascular: Negative for chest pain and palpitations.       Objective:   Physical Exam   Vitals reviewed.  Constitutional: She appears well-developed and well-nourished. No distress.   Cardiovascular: Normal rate, regular rhythm and normal heart sounds.    No murmur heard.  Pulmonary/Chest: Effort normal and breath sounds normal. No respiratory distress.       BP 112/72   Pulse 84   Temp(Src) 98.5 ??F (36.9 ??C) (Oral)   Resp 16   Ht 5\' 4"  (1.626 m)   Wt 269 lb (122.018 kg)   BMI 46.15 kg/m2   LMP 03/26/2013    Assessment:      1. Morbid obesity with BMI of 40.0-44.9, adult (HCC)             Plan:      Reviewed options  Rx for adipex  Aware of use  Understands need for diet and exercise changes  Pt to call insurance and ask about belviq  Return in 1 month

## 2013-04-19 NOTE — Telephone Encounter (Signed)
Did she start adipex like given at last appt?  I thought she was going to try adipex and then transition to qsymia?

## 2013-04-19 NOTE — Telephone Encounter (Signed)
Pt would like to get an Rx for another weight loss medication.  Did you want her to start Qysmia or Belviq?  I can get the PA start with a written Rx of whichever one.

## 2013-04-21 NOTE — Telephone Encounter (Signed)
Started Adipex while waiting for Qysmia

## 2013-04-24 MED ORDER — ONDANSETRON HCL 4 MG PO TABS
4 MG | ORAL_TABLET | Freq: Two times a day (BID) | ORAL | Status: DC | PRN
Start: 2013-04-24 — End: 2013-05-10

## 2013-04-24 NOTE — Telephone Encounter (Signed)
When I went to print I realized she has allergy to topamax in the chart.  Is she really allergic?  This is much lower dose, does she want to try anyway?

## 2013-04-24 NOTE — Telephone Encounter (Signed)
Patient is experiencing nausea and vomiting every morning, she called to see if Dr. Titus Dubin could prescribe something for the nausea and vomiting.  She also question if the metformin that she takes is cause her to vomit everyday? She is leaving to go on Vacation on Wednesday and doesn't want to be sick during the vacation.  [facility] of choice is [facility]   Please advise  SR

## 2013-04-24 NOTE — Telephone Encounter (Signed)
When did the vomiting start?  Metformin is not new, so has she been doing this ever since started this months/years ago?  Or is it with the adipex recently?  Just in the am?  Throughout the day?  Rx for zofran for as needed with nausea/vomiting sent to pharmacy

## 2013-04-25 NOTE — Telephone Encounter (Signed)
Left vm with doctors message. Asked pt to call the office.

## 2013-04-25 NOTE — Telephone Encounter (Signed)
No answer for pt

## 2013-04-26 NOTE — Telephone Encounter (Signed)
Pt states that it's not an allergy, just felt like she was "high", "out of thoughts", she has an appt 07/10 for weight check

## 2013-04-26 NOTE — Telephone Encounter (Signed)
Pt states that she has been vomiting on and off for a while, she had an appt with Dr Namon CirriGolsmith approx 3 months ago and discussed vomiting with Dr Titus DubinGoldsmith, Dr suggested maybe it was the Lisinopril so she switched to bedtime.  She hasn't taken her metformin for the last 2-3 days and has not had vomiting.  She will discuss everything with Dr Titus DubinGoldsmith on Thursday.  She will be going out of town today and return on Saturday, Sunday she will try 500 mg twice daily to see if that causes the same effect.

## 2013-05-04 LAB — POCT GLUCOSE: Glucose: 149 mg/dL

## 2013-05-04 MED ORDER — FREESTYLE SYSTEM KIT
PACK | Freq: Every day | Status: AC | PRN
Start: 2013-05-04 — End: ?

## 2013-05-04 MED ORDER — LANCETS MISC
Status: AC
Start: 2013-05-04 — End: ?

## 2013-05-04 MED ORDER — BLOOD GLUCOSE TEST VI STRP
ORAL_STRIP | Status: DC
Start: 2013-05-04 — End: 2013-07-21

## 2013-05-04 MED ORDER — PHENTERMINE HCL 37.5 MG PO TABS
37.5 MG | ORAL_TABLET | Freq: Every day | ORAL | Status: DC
Start: 2013-05-04 — End: 2013-06-01

## 2013-05-04 NOTE — Progress Notes (Signed)
Subjective:      Patient ID: Jenny Stephens is a 47 y.o. female.    HPI Comments: Pt is here today for weight loss.  Pt has lost 17# since her last visit.  Pt states that she has been watching her diet and taking the adipex.   This will be pt's 2nd refill.      Pt did state that she decreased her metformin to 500 mg bid daily.  Pt did start taking the zofran for nausea and it has helped her.      Pt has no other questions or concerns.jb    CC: weight, nausea/sugars    Pt here following 1st month of adipex.  She has lost 17 lbs.  Says quit working.  Really changed diet, eating lots of veggies and fruits.  Drinking lots of water.  Says she has been swimming regularly.  No known side effects except possibly nausea.  She wondered if could be from metformin too.  Has been using zofran with significant relief.  Cut back on metformin to try to help as well.  Has no way to monitor sugar.  No light headed.  Wonders if really needs as much metformin anyway with weight loss and diet changes.  No bowel changes or abdominal pains.    Review of Systems   Constitutional: Negative for fever and chills.   Respiratory: Negative for cough and shortness of breath.    Cardiovascular: Negative for chest pain and palpitations.   Gastrointestinal: Positive for nausea and vomiting. Negative for abdominal pain and constipation.   Neurological: Negative for dizziness and light-headedness.       Objective:   Physical Exam   Vitals reviewed.  Constitutional: She appears well-developed and well-nourished. No distress.   Weight down 17 lbs   Cardiovascular: Normal rate, regular rhythm and normal heart sounds.    No murmur heard.  Pulmonary/Chest: Effort normal and breath sounds normal. No respiratory distress.   Psychiatric: She has a normal mood and affect. Her behavior is normal.       BP 110/72   Pulse 100   Temp(Src) 98.5 ??F (36.9 ??C) (Oral)   Resp 20   Wt 252 lb (114.306 kg)   BMI 43.23 kg/m2   LMP 03/26/2013    Assessment:      1. Morbid  obesity with BMI of 40.0-44.9, adult (HCC)    2. Diabetes mellitus             Plan:      Refill adipex  Continue with healthy diet and exercise changes  Ok to remain on lower dose of metformin for now  Rx for glucometer and test strips  Return to in 1 month for recheck

## 2013-05-05 NOTE — Patient Instructions (Signed)
Body Mass Index: After Your Visit  Your Care Instructions  Body mass index (BMI) can help you see if your weight is raising your risk for health problems. It uses a formula to compare how much you weigh with how tall you are. A BMI between 18.5 and 24.9 is considered healthy. A BMI between 25 and 29.9 is considered overweight. A BMI of 30 or higher is considered obese.  If your BMI is in the normal range, it means that you have a lower risk for weight-related health problems. If your BMI is in the overweight or obese range, you may be at increased risk for weight-related health problems, such as high blood pressure, heart disease, stroke, arthritis or joint pain, and diabetes.  BMI is just one measure of your risk for weight-related health problems. You may be at higher risk for health problems if you are not active, you eat an unhealthy diet, or you drink too much alcohol or use tobacco products.  Follow-up care is a key part of your treatment and safety. Be sure to make and go to all appointments, and call your doctor if you are having problems. It???s also a good idea to know your test results and keep a list of the medicines you take.  How can you care for yourself at home?  ?? Practice healthy eating habits. This includes eating plenty of fruits, vegetables, whole grains, lean protein, and low-fat dairy.  ?? Get at least 30 minutes of exercise 4 to 5 days a week or more. Brisk walking is a good choice. You also may want to do other activities, such as running, swimming, cycling, or playing tennis or team sports.  ?? Do not smoke. Smoking can increase your risk for health problems. If you need help quitting, talk to your doctor about stop-smoking programs and medicines. These can increase your chances of quitting for good.  ?? Limit alcohol to 2 drinks a day for men and 1 drink a day for women. Too much alcohol can cause health problems.  If you have a BMI higher than 25  ?? Your doctor may do other tests to check  your risk for weight-related health problems. This may include measuring the distance around your waist. A waist measurement of more than 40 inches in men or 35 inches in women can increase the risk of weight-related health problems.  ?? Talk with your doctor about steps you can take to stay healthy or improve your health. You may need to make lifestyle changes to lose weight and stay healthy, such as changing your diet and getting regular exercise.   Where can you learn more?   Go to [email or web address] and sign in to your MyChart account. Enter S176 in the Search Health Information box to learn more about ???Body Mass Index: After Your Visit.???    If you do not have an account, please click on the ???Sign Up Now??? link.     ?? 2006-2014 [employer]. Care instructions adapted under license by [facility]. This care instruction is for use with your licensed healthcare professional. If you have questions about a medical condition or this instruction, always ask your healthcare professional. [employer], Inc[employer] disclaims any warranty or liability for your use of this information.  Content Version: 10.0.273164; Last Revised: September 09, 2012

## 2013-05-10 MED ORDER — ONDANSETRON HCL 4 MG PO TABS
4 MG | ORAL_TABLET | Freq: Two times a day (BID) | ORAL | Status: DC | PRN
Start: 2013-05-10 — End: 2013-06-01

## 2013-05-10 NOTE — Telephone Encounter (Signed)
Rx sent

## 2013-05-10 NOTE — Telephone Encounter (Signed)
Pt called and requests a refill of this medication.      JC

## 2013-05-18 MED ORDER — PROCHLORPERAZINE MALEATE 5 MG PO TABS
5 MG | ORAL_TABLET | Freq: Three times a day (TID) | ORAL | Status: DC | PRN
Start: 2013-05-18 — End: 2013-06-12

## 2013-05-18 NOTE — Telephone Encounter (Signed)
I am confused b/c they paid for it previously?  A few things; first printed off rx for compazine to use as needed but more importantly I want her to try going without the medication.  I am still not sure I understand why she is getting so nauseated.  If she is nauseated again after stopping medication we probably need to do some more tests to see why she is feeling like this.

## 2013-05-18 NOTE — Telephone Encounter (Signed)
Pt insurance company will not approve the Zofran Rx, what is another Rx she can take?

## 2013-05-19 NOTE — Telephone Encounter (Signed)
I spoke with pt and she is aware to try going with out the Compazine medication to see if she still becomes nauseated. She has a follow up OV on 06/02/13 with Dr. Titus Dubin. Compazine Rx faxed over to her pharmacy.

## 2013-06-01 MED ORDER — PHENTERMINE HCL 37.5 MG PO TABS
37.5 MG | ORAL_TABLET | Freq: Every day | ORAL | Status: AC
Start: 2013-06-01 — End: 2013-07-01

## 2013-06-01 MED ORDER — LISINOPRIL 10 MG PO TABS
10 MG | ORAL_TABLET | Freq: Every day | ORAL | Status: DC
Start: 2013-06-01 — End: 2013-07-04

## 2013-06-01 NOTE — Progress Notes (Signed)
Subjective:      Patient ID: Jenny Stephens is a 47 y.o. female.    HPI Comments: .Treatment Adherence: Pt is here is today for her weight in.  Pt has lost 13# since her last visit.  Pt needs her A1c checked and her liver enzymes today.     CC: weight, diabetes, vomiting  Medication compliance:  compliant all of the time  Diet compliance:  compliant all of the time  Weight trend: decreasing  Current exercise: swimming   What might prevent you from meeting your goal?: none  Patient plan for overcoming barriers: loosing weight     Patient Confidence: 10/10      Diabetes Mellitus Type 2: Current symptoms/problems include vomiting.    Home blood sugar records:  patient does not test  Any episodes of hypoglycemia? no  Eye exam current (within one year): yes  Tobacco history: She  reports that she has quit smoking. She has never used smokeless tobacco.   Daily Aspirin? No: n/a  Known diabetic complications: none    Hypertension:  Home blood pressure monitoring: No.  She is adherent to a low sodium diet. Patient complains of lightheadedness.  Antihypertensive medication side effects: no medication side effects noted and orthostatic lightheadedness.  Use of agents associated with hypertension: none.     Hyperlipidemia:  No new myalgias or GI upset on simvastatin (Zocor).       LABA1C      6.6   12/13/2012  LABA1C      6.4   02/10/2012  LABA1C      6.4   02/04/2011  LABMICR         1.0   02/10/2012  CREATININE     0.74   12/13/2012  ALT       35   12/13/2012  AST       41   12/13/2012  CHOL         135   12/13/2012  TRIG         110   12/13/2012  HDL           53   12/13/2012  LDLCALC       60   12/13/2012    Says this will be her 3rd month of adipex.  Has done very well.  No side effects.  Exercising.  Watching what she is eating.    Says still vomiting after eating in the morning.  Says will take pills and eat breakfast.  Then <1 hour later throws up.  Doesn't really eat much lunch and then ok by dinner.  Says compazine resolves  symptoms.  Denies change in bowels.  Denies abdominal pains.  Under a lot of stress with her son who is addicted to heroin.    Past Medical History   Diagnosis Date   ??? Diabetes mellitus (HCC)    ??? Depression    ??? Osteoarthritis    ??? RAD (reactive airway disease)    ??? Hyperlipidemia    ??? GERD (gastroesophageal reflux disease)    ??? Allergic rhinitis    ??? Hypertension    ??? Rosacea    ??? Embolism - blood clot 2004     pe   ??? Fibromyalgia    ??? Muscle pain      axial skeletal   ??? Overactive bladder    ??? PCOS (polycystic ovarian syndrome)    ??? Decreased energy        Review of Systems  Constitutional: Negative for fever and chills.   Respiratory: Negative for cough and shortness of breath.    Cardiovascular: Negative for chest pain, palpitations and leg swelling.   Gastrointestinal: Positive for vomiting. Negative for abdominal pain, diarrhea and constipation.   Genitourinary: Negative for dysuria and difficulty urinating.   Musculoskeletal: Positive for arthralgias and gait problem.   Skin: Negative for rash and wound.   Neurological: Positive for dizziness and light-headedness.   Psychiatric/Behavioral: Positive for dysphoric mood. The patient is nervous/anxious.        Objective:   Physical Exam   Constitutional: She is oriented to person, place, and time. She appears well-developed and well-nourished. No distress.   HENT:   Head: Normocephalic and atraumatic.   Nose: Nose normal.   Mouth/Throat: Oropharynx is clear and moist.   Neck: Normal range of motion. Neck supple.   Cardiovascular: Normal rate, regular rhythm and normal heart sounds.    No murmur heard.  Pulmonary/Chest: Effort normal and breath sounds normal. No respiratory distress.   Musculoskeletal: She exhibits no edema.   Stump right lower leg   Lymphadenopathy:     She has no cervical adenopathy.   Neurological: She is alert and oriented to person, place, and time.   Psychiatric: Her speech is normal. She exhibits a depressed mood.   Tearful    Vitals  reviewed.      BP 90/54   Pulse 84   Temp(Src) 98.2 ??F (36.8 ??C) (Oral)   Resp 16   Wt 239 lb (108.41 kg)   BMI 41 kg/m2    Assessment:      1. Obesity    2. Vomiting    3. Type II or unspecified type diabetes mellitus without mention of complication, not stated as uncontrolled    4. Elevated liver enzymes             Plan:      Will refill adipex for last month  Continue with good diet and exercise changes  Return next month if desires to transition to qsymia  Will check GB US with vomiting and weight loss  Continue with compazine prn  A1c today  If even lower can try d/c metformin  bp low   D/c lisinopril-hctz and start just 10 mg of lisinopril  Repeat liver enzymes today  Diabetes Counseling   Patient was again counseled regarding disease risks and need to adopt and maintain healthy behaviors, and was provided education materials to assist with self management. These included logs to record blood pressure, caloric intake and/or blood sugar (some given at previous visits). Patient was instructed to keep logs up-to-date and to always bring logs to all office visits.

## 2013-06-01 NOTE — Patient Instructions (Signed)
Diabetes Management    Blood Sugar Log  Day Breakfast Lunch Dinner    Before 2 hrs After Before 2 hrs After Before 2 hrs After    Time Number Time  Number Time Number Time Number Time  Number Time Number   Sun               Mon               Tues               Wed               Thurs               Fri               Sat                 1. Keep your blood sugar level (A1c) below 7.0%  ??? If your blood sugar is ABOVE 7.0%, get an A1c test every 3 months.   ??? If your blood sugar is BELOW 7.0%, get an A1c test every 6 months.    2. Keep your blood pressure below 130/80  ??? Make sure your blood pressure is measured at every office visit    3. Obtain a foot exam once a year during your doctor???s visit; also, do foot exam on yourself every week or have someone do it for you     4. Keep your LDL (bad) cholesterol level below 100  ??? Make sure your lipids are measured once a year     5. Obtain a urine test (microalbumin) once a year    6. Obtain an eye exam once a year    7. Learn to stay healthy living with Diabetes  ??? Attend Diabetes Education course if ordered by your physician    8. Exercise and slowly progress to (brisk walking, bike riding, etc) 30 minutes 3 to 5 days a week.    9. Obtain annual flu shot    10. Obtain pneumonia shot if you have not done so  11. Refer to patient education handouts given to you today    Diabetes Management Community Resources   Organization Address Program Phone / Website   [facility] [address] [place] [address] Diabetes Self-Management  [phone]   [facility] [address] [place], [place] Diabetes Self-Management  [phone]   [facility] [place] [address] [place], [place]  Diabetes Self-Management  [phone]   [facility] [address] [place] [address] Help with Food [phone]   [facility] for Prescription Assistance n/a Medications [phone]

## 2013-06-02 LAB — COMPREHENSIVE METABOLIC PANEL
ALT: 40 U/L — ABNORMAL HIGH (ref 0–33)
AST: 46 U/L — ABNORMAL HIGH (ref 0–35)
Albumin: 3.9 g/dL (ref 3.9–4.9)
Alkaline Phosphatase: 57 U/L (ref 40–130)
Anion Gap: 13 mEq/L (ref 7–13)
BUN: 13 mg/dL (ref 6–20)
CO2: 31 mEq/L — ABNORMAL HIGH (ref 22–29)
Calcium: 9 mg/dL (ref 8.6–10.2)
Chloride: 92 mEq/L — ABNORMAL LOW (ref 98–107)
Creatinine: 1.12 mg/dL — ABNORMAL HIGH (ref 0.50–0.90)
GFR African American: >60 (ref >60)
GFR Non-African American: 54.9 — ABNORMAL LOW (ref >60)
Globulin: 2.2 g/dL — ABNORMAL LOW (ref 2.3–3.5)
Glucose: 88 mg/dL (ref 74–109)
Potassium: 3.1 mEq/L — ABNORMAL LOW (ref 3.5–5.1)
Sodium: 136 mEq/L (ref 132–144)
Total Bilirubin: 0.3 mg/dL (ref 0.0–1.2)
Total Protein: 6.1 g/dL — ABNORMAL LOW (ref 6.4–8.1)

## 2013-06-02 LAB — HEMOGLOBIN A1C: Hemoglobin A1C: 6.5 % — ABNORMAL HIGH (ref 4.8–5.9)

## 2013-06-06 MED ORDER — POTASSIUM CHLORIDE CRYS ER 20 MEQ PO TBCR
20 MEQ | ORAL_TABLET | Freq: Every day | ORAL | Status: DC
Start: 2013-06-06 — End: 2013-08-03

## 2013-06-06 NOTE — Telephone Encounter (Signed)
Rx for potassium sent to local pharmacy, liver enzymes are essentially unchanged.  Has she ever been checked for hepatitis (although highly doubt) given son's history.       Pt is aware of results. States she has been taking OTC potassium but is finding the pills in her stool, she is open to the idea of Rx potassium. Pt states she will try not taking the metformin and seeing how it goes. Pt also was asking about her liver function. JC

## 2013-06-07 NOTE — Telephone Encounter (Signed)
No answer.

## 2013-06-09 MED ORDER — FUROSEMIDE 40 MG PO TABS
40 MG | ORAL_TABLET | Freq: Every day | ORAL | Status: DC
Start: 2013-06-09 — End: 2013-07-04

## 2013-06-09 NOTE — Telephone Encounter (Signed)
Pt states that previously she was taking lisinopril HCTZ 20/25 and takes 2-20 mg lasix. She would like to know what dr would recommend. Should she go back on lisinopril, or should she take 3-20 mg lasix, or is there a stronger mg lasix that could be prescribed? If lisinopril is recommended, she will need rx.

## 2013-06-09 NOTE — Telephone Encounter (Signed)
Left message for pt with all directions below

## 2013-06-09 NOTE — Telephone Encounter (Signed)
Pt called and stated that since Dr Titus DubinGoldsmith lowered her dose of water pill she is experiencing swelling in her left ankle and calf more so than before the Rx was changed.. Would it be possible to go back to the higher dose?   JC

## 2013-06-09 NOTE — Telephone Encounter (Signed)
Pt aware rx sent to pharmacy, has picked up and is taking. She states she has never been checked for hepatitis and is willing if dr wants her to do so.

## 2013-06-09 NOTE — Telephone Encounter (Signed)
Continue with lisinopril plain  Sent over a higher dose of lasix   Needs to make sure taking potassium  Will recheck potassium when comes next month for appt

## 2013-06-09 NOTE — Telephone Encounter (Signed)
Yes she can go back to how was before but monitor bp  Also there is another open encounter for this patient

## 2013-06-11 ENCOUNTER — Encounter

## 2013-06-12 MED ORDER — SIMVASTATIN 40 MG PO TABS
40 MG | ORAL_TABLET | ORAL | Status: DC
Start: 2013-06-12 — End: 2013-07-04

## 2013-06-12 MED ORDER — OXYBUTYNIN CHLORIDE 5 MG PO TABS
5 MG | ORAL_TABLET | Freq: Two times a day (BID) | ORAL | Status: DC
Start: 2013-06-12 — End: 2013-07-04

## 2013-06-12 MED ORDER — PROCHLORPERAZINE MALEATE 5 MG PO TABS
5 MG | ORAL_TABLET | Freq: Three times a day (TID) | ORAL | Status: DC | PRN
Start: 2013-06-12 — End: 2013-07-04

## 2013-06-12 NOTE — Telephone Encounter (Signed)
Pt was last seen by Dr Titus DubinGoldsmith 05/2013   Pt last blood work was 11/2012

## 2013-06-12 NOTE — Telephone Encounter (Signed)
Rx sent

## 2013-06-12 NOTE — Telephone Encounter (Signed)
Error, Rx refills already taken care of per Henderson CloudMichelle   JC

## 2013-06-22 NOTE — Progress Notes (Signed)
Pt son dx with mono, she wanted labs drawn also to check her.

## 2013-06-24 LAB — EPSTEIN BARR VIRUS (EBV) ANTIBODY PANEL I
EBV Early Ag Ab: 15.5 U/mL — ABNORMAL HIGH (ref 0.0–10.9)
EBV VCA,IgG: 204 U/mL — ABNORMAL HIGH (ref 0.0–21.9)
EBV VCA,IgM: <10 U/mL (ref 0.0–43.9)
Epstein Barr virus nuclear Ab IgG: 95.3 U/mL — ABNORMAL HIGH (ref 0.0–21.9)

## 2013-06-27 ENCOUNTER — Telehealth

## 2013-06-27 NOTE — Telephone Encounter (Signed)
I guess it depends.  How is her swelling with just the lasix.  If swelling is fine but bp is a little higher then double up on lisinopril 10 mg (take 2 to make 20 mg) and monitor bp.  If swelling is not good then yes can restart lisinopril-hctz

## 2013-06-27 NOTE — Telephone Encounter (Signed)
-----   Message from Barrett Henle, DO sent at 06/27/2013  7:32 AM EDT -----  Notify pt that looks like she has had past mono infection but nothing acute b/c IgM is negative.

## 2013-06-27 NOTE — Telephone Encounter (Signed)
130/86, 127/84, 136/86, 130/89, changed lisinopril, pt afraid blood pressure going back up, pt was advised to stop Lisinopril 20/25 and metformin.   Should she restart the 20/25 Lisinopril?  If so she will need a new RX called in.  She is aware of the blood work results

## 2013-06-28 MED ORDER — LISINOPRIL-HYDROCHLOROTHIAZIDE 20-25 MG PO TABS
20-25 MG | ORAL_TABLET | Freq: Every day | ORAL | Status: DC
Start: 2013-06-28 — End: 2013-11-07

## 2013-06-28 NOTE — Telephone Encounter (Signed)
Rx sent to [facility]

## 2013-06-28 NOTE — Telephone Encounter (Signed)
error 

## 2013-06-28 NOTE — Telephone Encounter (Signed)
Pt called in today, She stated that the swelling is ok, but only because she has taken more lasix that she is supposed to. She feels that the lisinopril-hctz will help, because she stated it worked well in the past.

## 2013-06-29 NOTE — Telephone Encounter (Signed)
Left VM advising the pt that an Rx had been sent in for her

## 2013-07-04 MED ORDER — PHENTERMINE-TOPIRAMATE 3.75-23 MG PO CP24
ORAL_CAPSULE | ORAL | Status: DC
Start: 2013-07-04 — End: 2013-08-03

## 2013-07-04 MED ORDER — FUROSEMIDE 40 MG PO TABS
40 MG | ORAL_TABLET | Freq: Every day | ORAL | Status: DC
Start: 2013-07-04 — End: 2013-11-07

## 2013-07-04 MED ORDER — SIMVASTATIN 40 MG PO TABS
40 MG | ORAL_TABLET | Freq: Every evening | ORAL | Status: DC
Start: 2013-07-04 — End: 2014-06-26

## 2013-07-04 MED ORDER — OXYBUTYNIN CHLORIDE 5 MG PO TABS
5 MG | ORAL_TABLET | Freq: Two times a day (BID) | ORAL | Status: DC
Start: 2013-07-04 — End: 2014-03-02

## 2013-07-04 MED ORDER — CELECOXIB 200 MG PO CAPS
200 MG | ORAL_CAPSULE | Freq: Two times a day (BID) | ORAL | Status: DC
Start: 2013-07-04 — End: 2013-10-03

## 2013-07-04 MED ORDER — PROCHLORPERAZINE MALEATE 5 MG PO TABS
5 MG | ORAL_TABLET | Freq: Three times a day (TID) | ORAL | Status: DC | PRN
Start: 2013-07-04 — End: 2013-09-01

## 2013-07-04 MED ORDER — LORATADINE 10 MG PO TABS
10 MG | ORAL_TABLET | ORAL | Status: DC
Start: 2013-07-04 — End: 2014-03-02

## 2013-07-04 MED ORDER — FUROSEMIDE 20 MG PO TABS
20 MG | ORAL_TABLET | Freq: Every day | ORAL | Status: DC
Start: 2013-07-04 — End: 2013-10-03

## 2013-07-04 MED ORDER — PHENTERMINE-TOPIRAMATE 7.5-46 MG PO CP24
ORAL_CAPSULE | ORAL | Status: DC
Start: 2013-07-04 — End: 2013-08-03

## 2013-07-04 NOTE — Progress Notes (Signed)
Subjective:      Patient ID: Jenny Stephens is a 47 y.o. female.    HPI Comments: Pt is here today for a weight in.  Pt has not lost any weight this month.  Pt was not able to swim or workout because she had a sore on her leg and was not able to wear her prosthetic.    Pt would like to discuss her recent US of her gallbladder.     Pt needs refills on her medications.    Pt has no other questions or concerns.jb    CC: nausea/vomiting, weight, swelling  Nausea & Vomiting  This is a new problem. The current episode started more than 1 month ago. The problem occurs daily. The problem has been waxing and waning. Associated symptoms include nausea and vomiting (doesn't as long as has medication). Pertinent negatives include no anorexia, arthralgias, chest pain, chills, coughing, fever or rash. The symptoms are aggravated by eating. Treatments tried: compazine. The treatment provided significant relief.   OBESITY: Patient has concerns re: their weight.  Patient has always had weight issues.  Current diet is Moderately healthy.  Mainly drinks water.  Activity level: was swimming until this past month when was without her leg.  Has tried adipex with good success in the past to lose weight.  Patient wondering what other options are available for weight loss.  Medical co-morbidities include: Hypertension, Hyperlipidemia and loss of leg.  Says didn't do well when we cut back on her lisinopril.  Under a lot of stress.  bp has gone up.  So has swelling.  Had a wound on stump but has healed.  Has been taking a total of 70 mg of diuretic daily.    Past Medical History   Diagnosis Date   ??? Diabetes mellitus (HCC)    ??? Depression    ??? Osteoarthritis    ??? RAD (reactive airway disease)    ??? Hyperlipidemia    ??? GERD (gastroesophageal reflux disease)    ??? Allergic rhinitis    ??? Hypertension    ??? Rosacea    ??? Embolism - blood clot 2004     pe   ??? Fibromyalgia    ??? Muscle pain      axial skeletal   ??? Overactive bladder    ??? PCOS  (polycystic ovarian syndrome)    ??? Decreased energy        Review of Systems   Constitutional: Negative for fever and chills.   Respiratory: Negative for cough and shortness of breath.    Cardiovascular: Positive for leg swelling. Negative for chest pain and palpitations.   Gastrointestinal: Positive for nausea and vomiting (doesn't as long as has medication). Negative for anorexia.   Musculoskeletal: Positive for gait problem. Negative for arthralgias.   Skin: Positive for wound. Negative for rash.   Psychiatric/Behavioral: Positive for dysphoric mood. The patient is nervous/anxious.        Objective:   Physical Exam   Constitutional: She is oriented to person, place, and time. She appears well-developed and well-nourished. No distress.   HENT:   Head: Normocephalic and atraumatic.   Neck: Neck supple.   Cardiovascular: Normal rate and regular rhythm.    Pulmonary/Chest: Effort normal and breath sounds normal. No respiratory distress.   Musculoskeletal: She exhibits no edema.   New leg on right   Lymphadenopathy:     She has no cervical adenopathy.   Neurological: She is alert and oriented to person, place, and  time.   Skin: Skin is warm and dry. No rash noted.   Psychiatric: Her speech is normal. She exhibits a depressed mood.   Vitals reviewed.      BP 108/80   Pulse 96   Temp(Src) 98.1 ??F (36.7 ??C) (Oral)   Resp 16   Wt 239 lb (108.41 kg)   BMI 41 kg/m2    Assessment:      1. Nausea & vomiting    2. Morbid obesity with BMI of 40.0-44.9, adult (HCC)    3. Leg edema             Plan:      Reviewed [place] and normal  However given symptoms began when weight loss started still feel related  Will arrange for HIDA scan   Discussed options for continued weight loss  Pt wants to try Qsymia; rx for first 2 weeks followed by next dose  Continue with diet and exercise improvements now that has leg back  Altered lisinopril-hctz and lasix dosing to control bp and swelling  Return in 4-6 weeks for f/u on weight

## 2013-07-08 NOTE — Patient Instructions (Signed)
Body Mass Index: After Your Visit  Your Care Instructions     Body mass index (BMI) can help you see if your weight is raising your risk for health problems. It uses a formula to compare how much you weigh with how tall you are. A BMI between 18.5 and 24.9 is considered healthy. A BMI between 25 and 29.9 is considered overweight. A BMI of 30 or higher is considered obese.  If your BMI is in the normal range, it means that you have a lower risk for weight-related health problems. If your BMI is in the overweight or obese range, you may be at increased risk for weight-related health problems, such as high blood pressure, heart disease, stroke, arthritis or joint pain, and diabetes.  BMI is just one measure of your risk for weight-related health problems. You may be at higher risk for health problems if you are not active, you eat an unhealthy diet, or you drink too much alcohol or use tobacco products.  Follow-up care is a key part of your treatment and safety. Be sure to make and go to all appointments, and call your doctor if you are having problems. It???s also a good idea to know your test results and keep a list of the medicines you take.  How can you care for yourself at home?  ?? Practice healthy eating habits. This includes eating plenty of fruits, vegetables, whole grains, lean protein, and low-fat dairy.  ?? Get at least 30 minutes of exercise 4 to 5 days a week or more. Brisk walking is a good choice. You also may want to do other activities, such as running, swimming, cycling, or playing tennis or team sports.  ?? Do not smoke. Smoking can increase your risk for health problems. If you need help quitting, talk to your doctor about stop-smoking programs and medicines. These can increase your chances of quitting for good.  ?? Limit alcohol to 2 drinks a day for men and 1 drink a day for women. Too much alcohol can cause health problems.  If you have a BMI higher than 25  ?? Your doctor may do other tests to check  your risk for weight-related health problems. This may include measuring the distance around your waist. A waist measurement of more than 40 inches in men or 35 inches in women can increase the risk of weight-related health problems.  ?? Talk with your doctor about steps you can take to stay healthy or improve your health. You may need to make lifestyle changes to lose weight and stay healthy, such as changing your diet and getting regular exercise.   Where can you learn more?   Go to [email or web address] and sign in to your MyChart account. Enter S176 in the Search Health Information box to learn more about ???Body Mass Index: After Your Visit.???    If you do not have an account, please click on the ???Sign Up Now??? link.     ?? 2006-2014 [employer], Inc[employer]. Care instructions adapted under license by [facility]. This care instruction is for use with your licensed healthcare professional. If you have questions about a medical condition or this instruction, always ask your healthcare professional. Healthwise, Incorporated disclaims any warranty or liability for your use of this information.  Content Version: 10.1.311062; Current as of: January 04, 2013

## 2013-07-12 NOTE — Telephone Encounter (Signed)
If staying high yes restart once a day

## 2013-07-12 NOTE — Telephone Encounter (Signed)
Pt called with problems regarding her blood sugar.  She has noticed that her blood sugar is going up, her fasting glucose this am was 150.  Should she restart metformin again? Once a day? Twice a day?  She leaves in the am for [place].

## 2013-07-13 NOTE — Telephone Encounter (Signed)
Pt aware.

## 2013-07-19 NOTE — Progress Notes (Signed)
Test was complete, result is final and filed in pt's chart. sy

## 2013-07-21 MED ORDER — BLOOD GLUCOSE TEST VI STRP
ORAL_STRIP | Status: DC
Start: 2013-07-21 — End: 2014-11-08

## 2013-07-21 NOTE — Telephone Encounter (Signed)
Rx should go to [facility] in [place].

## 2013-07-21 NOTE — Telephone Encounter (Signed)
Pt aware rx sent

## 2013-07-21 NOTE — Telephone Encounter (Signed)
Pt last seen by Dr Titus Dubin on 07/04/2013.

## 2013-07-21 NOTE — Telephone Encounter (Signed)
Rx printed b/c no pharmacy selected

## 2013-08-03 MED ORDER — PHENTERMINE-TOPIRAMATE 7.5-46 MG PO CP24
ORAL_CAPSULE | ORAL | Status: DC
Start: 2013-08-03 — End: 2013-09-01

## 2013-08-03 MED ORDER — POTASSIUM CHLORIDE CRYS ER 20 MEQ PO TBCR
20 MEQ | ORAL_TABLET | Freq: Every day | ORAL | Status: DC
Start: 2013-08-03 — End: 2013-09-01

## 2013-08-03 NOTE — Patient Instructions (Signed)
Nausea and Vomiting: After Your Visit  Your Care Instructions     When you are nauseated, you may feel weak and sweaty and notice a lot of saliva in your mouth. Nausea often leads to vomiting. Most of the time you do not need to worry about nausea and vomiting, but they can be signs of other illnesses.  Two common causes of nausea and vomiting are stomach flu and food poisoning. Nausea and vomiting from viral stomach flu will usually start to improve within 24 hours. Nausea and vomiting from food poisoning may last from 12 to 48 hours.  The doctor has checked you carefully, but problems can develop later. If you notice any problems or new symptoms, get medical treatment right away.  Follow-up care is a key part of your treatment and safety. Be sure to make and go to all appointments, and call your doctor if you are having problems. It's also a good idea to know your test results and keep a list of the medicines you take.  How can you care for yourself at home?  ?? To prevent dehydration, drink plenty of fluids, enough so that your urine is light yellow or clear like water. Choose water and other caffeine-free clear liquids until you feel better. If you have kidney, heart, or liver disease and have to limit fluids, talk with your doctor before you increase the amount of fluids you drink.  ?? Rest in bed until you feel better.  ?? When you are able to eat, try clear soups, mild foods, and liquids until all symptoms are gone for 12 to 48 hours. Other good choices include dry toast, crackers, cooked cereal, and gelatin dessert, such as Jell-O.  When should you call for help?  Call 911 anytime you think you may need emergency care. For example, call if:  ?? You passed out (lost consciousness).  Call your doctor now or seek immediate medical care if:  ?? You have symptoms of dehydration, such as:  ?? Dry eyes and a dry mouth.  ?? Passing only a little dark urine.  ?? Feeling thirstier than usual.  ?? You have new or worsening  belly pain.  ?? You have a new or higher fever.  ?? You vomit blood or what looks like coffee grounds.  Watch closely for changes in your health, and be sure to contact your doctor if:  ?? You have ongoing nausea and vomiting.  ?? Your vomiting is getting worse.  ?? Your vomiting lasts longer than 2 days.  ?? You are not getting better as expected.   Where can you learn more?   Go to [email or web address] and sign in to your MyChart account. Enter H591 in the Search Health Information box to learn more about ???Nausea and Vomiting: After Your Visit.???    If you do not have an account, please click on the ???Sign Up Now??? link.     ?? 2006-2014 [employer], [employer]. Care instructions adapted under license by [facility]. This care instruction is for use with your licensed healthcare professional. If you have questions about a medical condition or this instruction, always ask your healthcare professional. [employer], [employer] disclaims any warranty or liability for your use of this information.  Content Version: 10.1.311062; Current as of: January 04, 2013

## 2013-08-03 NOTE — Progress Notes (Signed)
Subjective:      Patient ID: Jenny Stephens is a 47 y.o. female.    HPI Comments: PT here for weight check. Pt taking qysmia and has lost 9lbs in the last month. Pt would like to continue with qysmia.     Pt needs refill on klorcon.    Pt wants to go ahead with referral for gastro. She already made the appt with [facility].     Pt also wanted to discuss whether she should take more calcium.     Pt has no other questions or concerns. sy    CC: weight, stomach issues    Pt is here following using qsymia for the first month.  Says has had no side effects with the medication.  Feels it is helping her.  Has been able to go back to swimming as well.  Still watching diet.  Goal is still to be below 200 lbs.  Continues to have nausea and vomiting.  Says without anti-nausea med she is unable to eat without throwing up.  Intermittent abdominal pains.  No cp or sob.    Review of Systems   Constitutional: Negative for fever and chills.   Respiratory: Negative for cough and shortness of breath.    Cardiovascular: Negative for chest pain and palpitations.   Gastrointestinal: Positive for nausea and vomiting. Negative for diarrhea.       Objective:   Physical Exam   Constitutional: She is oriented to person, place, and time. She appears well-developed and well-nourished. No distress.   Weight down 9 lbs   Cardiovascular: Normal rate, regular rhythm and normal heart sounds.    Pulmonary/Chest: Effort normal and breath sounds normal. No respiratory distress.   Neurological: She is alert and oriented to person, place, and time.   Vitals reviewed.      BP 100/64   Pulse 80   Temp(Src) 98.1 ??F (36.7 ??C) (Oral)   Resp 12   Ht 5\' 4"  (1.626 m)   Wt 230 lb 9.6 oz (104.599 kg)   BMI 39.56 kg/m2    Assessment:      1. Obesity (BMI 30-39.9)    2. Nausea & vomiting             Plan:      Good weight loss  Continue with healthy diet and exercise  Refill qsymai  Referral to GI given, already has an appt  Refills given  Return in 1 month

## 2013-08-28 MED ORDER — POTASSIUM CHLORIDE 20 MEQ/15ML (10%) PO LIQD
20 MEQ/15ML (10%) | Freq: Every day | ORAL | Status: DC
Start: 2013-08-28 — End: 2014-07-31

## 2013-08-28 NOTE — Telephone Encounter (Signed)
Rx sent to [facility]

## 2013-08-28 NOTE — Telephone Encounter (Signed)
Advised pt.

## 2013-08-28 NOTE — Telephone Encounter (Signed)
Pt had EGD and several polyps were removed one of them was seeping and somewhat blood drainage.  She was told to try different medications and see if she could find out which medcation was causing the vomiting.  She has figured out that it is the potassium tablet.  She knows that the oral liquid potassium is very hard to swallow but she would rather take that than vomiting.

## 2013-09-01 MED ORDER — GLYBURIDE 2.5 MG PO TABS
2.5 MG | ORAL_TABLET | Freq: Every day | ORAL | Status: DC
Start: 2013-09-01 — End: 2013-10-03

## 2013-09-01 MED ORDER — PHENTERMINE-TOPIRAMATE 7.5-46 MG PO CP24
ORAL_CAPSULE | ORAL | Status: DC
Start: 2013-09-01 — End: 2013-10-03

## 2013-09-01 NOTE — Patient Instructions (Signed)
Body Mass Index: After Your Visit  Your Care Instructions     Body mass index (BMI) can help you see if your weight is raising your risk for health problems. It uses a formula to compare how much you weigh with how tall you are. A BMI between 18.5 and 24.9 is considered healthy. A BMI between 25 and 29.9 is considered overweight. A BMI of 30 or higher is considered obese.  If your BMI is in the normal range, it means that you have a lower risk for weight-related health problems. If your BMI is in the overweight or obese range, you may be at increased risk for weight-related health problems, such as high blood pressure, heart disease, stroke, arthritis or joint pain, and diabetes.  BMI is just one measure of your risk for weight-related health problems. You may be at higher risk for health problems if you are not active, you eat an unhealthy diet, or you drink too much alcohol or use tobacco products.  Follow-up care is a key part of your treatment and safety. Be sure to make and go to all appointments, and call your doctor if you are having problems. It???s also a good idea to know your test results and keep a list of the medicines you take.  How can you care for yourself at home?  ?? Practice healthy eating habits. This includes eating plenty of fruits, vegetables, whole grains, lean protein, and low-fat dairy.  ?? Get at least 30 minutes of exercise 4 to 5 days a week or more. Brisk walking is a good choice. You also may want to do other activities, such as running, swimming, cycling, or playing tennis or team sports.  ?? Do not smoke. Smoking can increase your risk for health problems. If you need help quitting, talk to your doctor about stop-smoking programs and medicines. These can increase your chances of quitting for good.  ?? Limit alcohol to 2 drinks a day for men and 1 drink a day for women. Too much alcohol can cause health problems.  If you have a BMI higher than 25  ?? Your doctor may do other tests to check  your risk for weight-related health problems. This may include measuring the distance around your waist. A waist measurement of more than 40 inches in men or 35 inches in women can increase the risk of weight-related health problems.  ?? Talk with your doctor about steps you can take to stay healthy or improve your health. You may need to make lifestyle changes to lose weight and stay healthy, such as changing your diet and getting regular exercise.   Where can you learn more?   Go to [email or web address] and sign in to your MyChart account. Enter S176 in the Search Health Information box to learn more about ???Body Mass Index: After Your Visit.???    If you do not have an account, please click on the ???Sign Up Now??? link.     ?? 2006-2014 [employer], Inc[employer]. Care instructions adapted under license by [facility]. This care instruction is for use with your licensed healthcare professional. If you have questions about a medical condition or this instruction, always ask your healthcare professional. Healthwise, Incorporated disclaims any warranty or liability for your use of this information.  Content Version: 10.1.311062; Current as of: January 04, 2013

## 2013-09-01 NOTE — Progress Notes (Signed)
Subjective:      Patient ID: Jenny Stephens is a 47 y.o. female.    HPI Comments: Pt here for a weight check. PT has lost 6 lbs    Pt would like to have labs to recheck her liver enzymes (high) and potassium(low) rechecked. Pt has adjusted some of her medications to try to help with that. Pt needs refill on metformin but is now taking 500 mg instead of 1000 mg so needs to discuss.     Pt has no other questions or concerns. sy      CC: weight, sugar    Pt here for f/u on weight.  Has lost another 6 lbs this month.  Still watching diet.  Not able to exercise d/t stump breakdown.  Has to go back to specialist later this month for f/u and concerned he will insist on surgery.  However causing her so much pain she is considering.  Says had EGD and ok.  They felt med related.  She switched potassium to liquid which made a big difference.  Also stopped metformin and when restarted it started nausea/vomiting again.  Wondering if something else can use to keep sugar down.  Tried without and too high.  Is hoping liver has improved with improved diet and less celebrex use.  Is concerned though b/c son has hep c that she could as well.    Review of Systems   Constitutional: Negative for fever and chills.   Respiratory: Negative for shortness of breath.    Cardiovascular: Negative for chest pain and palpitations.   Gastrointestinal: Positive for nausea. Negative for vomiting and diarrhea.   Musculoskeletal: Positive for gait problem.       Objective:   Physical Exam   Constitutional: She is oriented to person, place, and time. She appears well-developed and well-nourished. No distress.   Cardiovascular: Normal rate, regular rhythm and normal heart sounds.    No murmur heard.  Pulmonary/Chest: Effort normal and breath sounds normal. No respiratory distress.   Musculoskeletal: She exhibits no edema.   Neurological: She is alert and oriented to person, place, and time.   Psychiatric:   Tearful when talking about possible surgery    Vitals reviewed.      BP 102/70   Pulse 88   Temp(Src) 98.3 ??F (36.8 ??C) (Oral)   Resp 20   Wt 224 lb (101.606 kg)   BMI 38.43 kg/m2    Assessment:      1. Type II or unspecified type diabetes mellitus without mention of complication, not stated as uncontrolled (HCC)    2. Obesity (BMI 30-39.9)    3. Hypokalemia    4. Elevated liver enzymes    5. Exposure to hepatitis             Plan:      Stop metformin  Rx for glyburide  Refill qsymia  Repeat labs today to f/u on potassium and liver enzymes  Will also check on hep panel given liver enzymes being high and son's diagnosis of hep c  Return in 1 month

## 2013-09-02 LAB — COMPREHENSIVE METABOLIC PANEL
ALT: 28 U/L (ref 0–33)
AST: 31 U/L (ref 0–35)
Albumin: 4.2 g/dL (ref 3.9–4.9)
Alkaline Phosphatase: 85 U/L (ref 40–130)
Anion Gap: 14 mEq/L — ABNORMAL HIGH (ref 7–13)
BUN: 17 mg/dL (ref 6–20)
CO2: 31 mEq/L — ABNORMAL HIGH (ref 22–29)
Calcium: 9.5 mg/dL (ref 8.6–10.2)
Chloride: 97 mEq/L — ABNORMAL LOW (ref 98–107)
Creatinine: 0.92 mg/dL — ABNORMAL HIGH (ref 0.50–0.90)
GFR African American: >60 (ref >60)
GFR Non-African American: >60 (ref >60)
Globulin: 2.1 g/dL — ABNORMAL LOW (ref 2.3–3.5)
Glucose: 103 mg/dL (ref 74–109)
Potassium: 3.4 mEq/L — ABNORMAL LOW (ref 3.5–5.1)
Sodium: 142 mEq/L (ref 132–144)
Total Bilirubin: 0.3 mg/dL (ref 0.0–1.2)
Total Protein: 6.3 g/dL — ABNORMAL LOW (ref 6.4–8.1)

## 2013-09-02 LAB — HEPATITIS ACUTE /W REFLEX CONF
Hep A IgM: NONREACTIVE
Hep B Core Ab, IgM: NONREACTIVE
Hep B S Ag Interp: NONREACTIVE
Hep C Ab Interp: NONREACTIVE

## 2013-09-05 NOTE — Telephone Encounter (Signed)
Pt called asking if there is anything else she can take besides Celebrex?  She has noticed that it is giving her side effects.  She is not asking for any type of narcotic medication just something like Celebrex.

## 2013-09-05 NOTE — Telephone Encounter (Signed)
What kind of side effects?  What other NSAIDs has she used in the past? Naproxen or mobic?

## 2013-09-07 MED ORDER — NAPROXEN 500 MG PO TABS
500 MG | ORAL_TABLET | Freq: Two times a day (BID) | ORAL | Status: DC
Start: 2013-09-07 — End: 2013-11-07

## 2013-09-07 NOTE — Telephone Encounter (Signed)
Rx for naprosyn sent

## 2013-09-07 NOTE — Telephone Encounter (Signed)
Left message for pt to call the office.

## 2013-09-07 NOTE — Telephone Encounter (Signed)
Pt called back and stated that she feels the Celebrex was upsetting her stomach. Pt thinks she has used mobic, but doesn't think it helped which is why she switched. Pt states she does not think she has used Naproxen before. Pt states she has used arthragra, ibuprofen, and Voltaren and none of those worked either.

## 2013-09-11 NOTE — Telephone Encounter (Signed)
Pt called and stated that since she stopped the metformin and started the glyburide she has been having her sugars in the low 90's, she's been getting very dizzy, hands & feet numb, and states that she is breathing heavier.  She is concerned and wants to know if there is something different you want her to take.

## 2013-09-11 NOTE — Telephone Encounter (Signed)
Have her cut the glyburide in 1/2 for a few days and notify office of readings

## 2013-09-11 NOTE — Telephone Encounter (Signed)
Advised pt of below.

## 2013-09-19 NOTE — Telephone Encounter (Signed)
EVD Screening

## 2013-10-03 MED ORDER — PHENTERMINE-TOPIRAMATE 15-92 MG PO CP24
15-92 MG | ORAL_CAPSULE | ORAL | Status: DC
Start: 2013-10-03 — End: 2013-11-07

## 2013-10-03 NOTE — Progress Notes (Signed)
Subjective:      Patient ID: Jenny Stephens is a 47 y.o. female.    HPI Comments: CC: weight    Pt here for weight loss. Would like to continue with with qyamia. Pt has lost 2 lbs.  Unable to exercise d/t stump issues.  Going in tomorrow for stump revision surgery.  Still trying to watch closely what eating.  Drinking lots of water.  Pt would like to discuss going off the progesterone. Pt has not had a period for three months.  Doesn't think needs anymore.  No hot flashes anymore.  Actually usually cold.  Pt needs a handicap placard for each car (2).       Review of Systems   Constitutional: Negative for fever and chills.   Cardiovascular: Negative for chest pain and palpitations.   Gastrointestinal: Negative for nausea and vomiting.   Musculoskeletal: Positive for arthralgias and gait problem.       Objective:   Physical Exam   Constitutional: She appears well-developed and well-nourished. No distress.   Weight down 2 lbs   Cardiovascular: Normal rate, regular rhythm and normal heart sounds.    No murmur heard.  Pulmonary/Chest: Effort normal and breath sounds normal. No respiratory distress.   Musculoskeletal: She exhibits no edema.   Right below knee amputee   Vitals reviewed.      BP 100/70   Pulse 68   Temp(Src) 98 ??F (36.7 ??C) (Oral)   Resp 20   Ht 5\' 4"  (1.626 m)   Wt 222 lb (100.699 kg)   BMI 38.09 kg/m2    Assessment:      1. Obesity (BMI 30-39.9)    2. Osteoarthritis    3. Other amputation stump complication             Plan:      Refill qsymia  Continue with good diet  F/u with surgeon re stump  Handicap placards ordered  Stop progesterone  Return in 1 month

## 2013-10-03 NOTE — Telephone Encounter (Signed)
Pt states that when she was in she forgot to mention that she needed a stronger pain medication other than naproxen. Can something be sent to the pharmacy?

## 2013-10-03 NOTE — Telephone Encounter (Signed)
Pt aware of dr's message.

## 2013-10-03 NOTE — Telephone Encounter (Signed)
With pt having surgery tomorrow I am sure they will give her stronger pain for that.  After she gets thru that if she still needs her medications adjusted we can discuss.

## 2013-11-07 MED ORDER — PHENTERMINE-TOPIRAMATE 15-92 MG PO CP24
15-92 MG | ORAL_CAPSULE | ORAL | Status: DC
Start: 2013-11-07 — End: 2014-01-04

## 2013-11-07 MED ORDER — GABAPENTIN 600 MG PO TABS
600 MG | ORAL_TABLET | ORAL | Status: DC
Start: 2013-11-07 — End: 2014-06-26

## 2013-11-07 MED ORDER — CELECOXIB 200 MG PO CAPS
200 MG | ORAL_CAPSULE | Freq: Two times a day (BID) | ORAL | Status: DC
Start: 2013-11-07 — End: 2013-12-08

## 2013-11-07 MED ORDER — LISINOPRIL-HYDROCHLOROTHIAZIDE 20-25 MG PO TABS
20-25 MG | ORAL_TABLET | Freq: Every day | ORAL | Status: DC
Start: 2013-11-07 — End: 2014-10-29

## 2013-11-07 MED ORDER — FUROSEMIDE 40 MG PO TABS
40 MG | ORAL_TABLET | Freq: Every day | ORAL | Status: DC
Start: 2013-11-07 — End: 2014-01-04

## 2013-11-07 NOTE — Progress Notes (Signed)
Subjective:      Patient ID: Jenny CookeySharon J Flow is a 48 y.o. female.    HPI Comments: Pt is here today for a refill on her qysmia medication.  Pt has lost 11# since her last visit.     Pt would like refills on his medications.    Pt has no other questions or concerns.jb    CC: weight    Pt is here on qsymia.  Went to higher dose last month d/t stump surgery.  She is not able to exercise right now.  Still closely watching diet.  No medication side effects.  Has pain medication from surgery but rarely using.  Would like to restart celebrex which uses with good relief of arthritis pain.    Review of Systems   Constitutional: Negative for fever and chills.   Cardiovascular: Negative for chest pain and palpitations.   Gastrointestinal: Negative for nausea and vomiting.   Musculoskeletal: Positive for myalgias and arthralgias.       Objective:   Physical Exam   Constitutional: She appears well-developed and well-nourished. No distress.   Cardiovascular: Normal rate, regular rhythm and normal heart sounds.    No murmur heard.  Pulmonary/Chest: Effort normal and breath sounds normal. No respiratory distress.   Vitals reviewed.      BP 92/64   Pulse 80   Temp(Src) 98.2 ??F (36.8 ??C) (Oral)   Resp 22   Wt 211 lb (95.709 kg)   LMP 07/20/2013    Assessment:      1. Obesity (BMI 30-39.9)    2. HTN (hypertension)    3. Osteoarthritis             Plan:      Refill qsymia at higher dose for 1 more month  Continue with improved diet and hopefully next month can go back to some exercise  Refills given  Restart celebrex  Return in 1 month

## 2013-12-08 MED ORDER — PHENTERMINE HCL 37.5 MG PO TABS
37.5 MG | ORAL_TABLET | Freq: Every day | ORAL | Status: AC
Start: 2013-12-08 — End: 2014-01-07

## 2013-12-08 MED ORDER — EPINEPHRINE 0.3 MG/0.3ML IJ SOAJ
0.3 MG/ML | INTRAMUSCULAR | Status: AC
Start: 2013-12-08 — End: ?

## 2013-12-08 MED ORDER — ALBUTEROL SULFATE HFA 108 (90 BASE) MCG/ACT IN AERS
108 (90 Base) MCG/ACT | Freq: Four times a day (QID) | RESPIRATORY_TRACT | Status: DC | PRN
Start: 2013-12-08 — End: 2014-08-29

## 2013-12-08 NOTE — Progress Notes (Signed)
Subjective:      Patient ID: Jenny CookeySharon J Stephens is a 48 y.o. female.    HPI Comments: Pt here for weight check.  She has not lost any weight in the past month, is weighing at home.  Pt would like to continue with the medication.     Pt also needs refill on epipen.     Pt has no other questions or concern. sy      CC: weight    Pt is here following another month of qysmia.  Says by her scale didn't lose anything.  Doesn't have new leg yet so cannot stand on our scale.  Says the stump has finally healed over so will now be able to start swimming again for exercise.  Continues to watch portions.  Needs refill on albuterol.  Uses more with cold weather.  Also her epipen has expired.      Review of Systems   Constitutional: Negative for fever and chills.   Respiratory: Positive for shortness of breath. Negative for cough.    Cardiovascular: Negative for chest pain and palpitations.       Objective:   Physical Exam   Constitutional: She appears well-developed and well-nourished. No distress.   Cardiovascular: Normal rate, regular rhythm and normal heart sounds.    No murmur heard.  Pulmonary/Chest: Effort normal and breath sounds normal. No respiratory distress.   Musculoskeletal:   In wheelchair, right BKA   Vitals reviewed.      BP 100/70    Pulse 84    Temp(Src) 97.7 ??F (36.5 ??C) (Oral)    Resp 20    Wt      Assessment:      1. Obesity (BMI 30-39.9)    2. Asthma             Plan:      Will stop qysmia  Restart adipex  Will be able to exercise again this month, get back into the pool  Refill albuterol and epipen for prn use  Return in 1 month

## 2013-12-08 NOTE — Patient Instructions (Signed)
Body Mass Index: After Your Visit  Your Care Instructions     Body mass index (BMI) can help you see if your weight is raising your risk for health problems. It uses a formula to compare how much you weigh with how tall you are. A BMI between 18.5 and 24.9 is considered healthy. A BMI between 25 and 29.9 is considered overweight. A BMI of 30 or higher is considered obese.  If your BMI is in the normal range, it means that you have a lower risk for weight-related health problems. If your BMI is in the overweight or obese range, you may be at increased risk for weight-related health problems, such as high blood pressure, heart disease, stroke, arthritis or joint pain, and diabetes.  BMI is just one measure of your risk for weight-related health problems. You may be at higher risk for health problems if you are not active, you eat an unhealthy diet, or you drink too much alcohol or use tobacco products.  Follow-up care is a key part of your treatment and safety. Be sure to make and go to all appointments, and call your doctor if you are having problems. It???s also a good idea to know your test results and keep a list of the medicines you take.  How can you care for yourself at home?  ?? Practice healthy eating habits. This includes eating plenty of fruits, vegetables, whole grains, lean protein, and low-fat dairy.  ?? Get at least 30 minutes of exercise 4 to 5 days a week or more. Brisk walking is a good choice. You also may want to do other activities, such as running, swimming, cycling, or playing tennis or team sports.  ?? Do not smoke. Smoking can increase your risk for health problems. If you need help quitting, talk to your doctor about stop-smoking programs and medicines. These can increase your chances of quitting for good.  ?? Limit alcohol to 2 drinks a day for men and 1 drink a day for women. Too much alcohol can cause health problems.  If you have a BMI higher than 25  ?? Your doctor may do other tests to check  your risk for weight-related health problems. This may include measuring the distance around your waist. A waist measurement of more than 40 inches in men or 35 inches in women can increase the risk of weight-related health problems.  ?? Talk with your doctor about steps you can take to stay healthy or improve your health. You may need to make lifestyle changes to lose weight and stay healthy, such as changing your diet and getting regular exercise.   Where can you learn more?   Go to [email or web address] and sign in to your MyChart account. Enter S176 in the Search Health Information box to learn more about ???Body Mass Index: After Your Visit.???    If you do not have an account, please click on the ???Sign Up Now??? link.     ?? 2006-2014 Health[employer], Inc[employer]. Care instructions adapted under license by [facility]. This care instruction is for use with your licensed healthcare professional. If you have questions about a medical condition or this instruction, always ask your healthcare professional. [employer], Inc[employer] disclaims any warranty or liability for your use of this information.  Content Version: 10.3.368381; Current as of: January 04, 2013

## 2014-01-04 MED ORDER — TRAZODONE HCL 150 MG PO TABS
150 MG | ORAL_TABLET | Freq: Every evening | ORAL | Status: DC
Start: 2014-01-04 — End: 2014-02-02

## 2014-01-04 MED ORDER — ESOMEPRAZOLE MAGNESIUM 40 MG PO CPDR
40 MG | ORAL_CAPSULE | Freq: Every day | ORAL | Status: DC
Start: 2014-01-04 — End: 2014-03-02

## 2014-01-04 MED ORDER — PHENTERMINE-TOPIRAMATE 15-92 MG PO CP24
15-92 MG | ORAL_CAPSULE | ORAL | Status: DC
Start: 2014-01-04 — End: 2014-02-02

## 2014-01-04 MED ORDER — FUROSEMIDE 40 MG PO TABS
40 MG | ORAL_TABLET | Freq: Every day | ORAL | Status: DC
Start: 2014-01-04 — End: 2014-03-02

## 2014-01-04 NOTE — Progress Notes (Signed)
Subjective:      Patient ID: Jenny CookeySharon J Schomburg is a 48 y.o. female.    HPI  Chief Complaint   Patient presents with   ??? Medication Refill     Adipex     Pt is here following month of adipex use.  She didn't lose any weight this month.  Says she is very upset.  She hasn't had any luck fitting another leg.  They are calling in a new fitter from [place] to try to help her.  She is constantly crying.  Won't have a leg until summer.  No side effects with adipex.  Mother thinks she should change her zoloft.  She isn't sure.      Review of Systems   Gastrointestinal: Negative for nausea and vomiting.   Musculoskeletal: Positive for myalgias and gait problem.   Psychiatric/Behavioral: Positive for dysphoric mood and agitation. Negative for suicidal ideas.       Objective:   Physical Exam   Constitutional: She appears well-developed and well-nourished. No distress.   Musculoskeletal:   In wheelchair   Psychiatric: Her speech is rapid and/or pressured. She exhibits a depressed mood.   Vitals reviewed.      BP 110/66    Pulse 84    Temp(Src) 98 ??F (36.7 ??C) (Oral)    Resp 14    Ht     Wt 217 lb (98.431 kg)    LMP 12/19/2013      Breastfeeding? No       Assessment:      1. Obesity (BMI 30-39.9)    2. Depression             Plan:      Will go back to qsymia  Encouraged pt to stay on track with diet and try to get to gym  F/u with prosthetist tomorrow   If still not looking like a good outcome can increase zoloft to 100 mg   F/u in 1 month

## 2014-02-02 MED ORDER — PHENTERMINE-TOPIRAMATE 15-92 MG PO CP24
15-92 MG | ORAL_CAPSULE | ORAL | Status: DC
Start: 2014-02-02 — End: 2014-03-02

## 2014-02-02 NOTE — Progress Notes (Signed)
Subjective:      Patient ID: Jenny Stephens is a 48 y.o. female.    HPI Comments: Pt here for weight check. Pt says she has lost three pounds, is checking at home. Was not able to get weight today with being in her wheel chair.  She would like to continue with her medication.     Pt has no other questions or concerns. sy      Pt is here for check up with qsymia.  She weights herself at home as still doesn't have her new leg.  She has lost 4 lbs and is hopeful by summer will have a new one.  Still eating healthier.  No medication side effects.  Wants to keep losing weight.  Goes to see her ENDO next month and having blood work done prior to that appt.    Review of Systems   Respiratory: Negative for cough and shortness of breath.    Cardiovascular: Negative for chest pain and palpitations.   Gastrointestinal: Negative for diarrhea and constipation.   Musculoskeletal: Positive for gait problem.       Objective:   Physical Exam   Constitutional: She appears well-developed and well-nourished. No distress.   Cardiovascular: Normal rate, regular rhythm and normal heart sounds.    Pulmonary/Chest: Effort normal and breath sounds normal. No respiratory distress.   Vitals reviewed.      BP 102/68    Pulse 88    Temp(Src) 98.3 ??F (36.8 ??C) (Oral)    Resp 20    Wt 213 lb (96.616 kg)    LMP 12/19/2013       Assessment:      1. Obesity (BMI 30-39.9)    2. Other and unspecified hyperlipidemia    3. Other screening mammogram             Plan:      Refill qsymia  Continue with healthy eating and work to increase exercise  Order for lipid panel to be done with other labs for diabetes at ENDO  Mammogram ordered

## 2014-03-02 MED ORDER — OXYBUTYNIN CHLORIDE 5 MG PO TABS
5 MG | ORAL_TABLET | Freq: Two times a day (BID) | ORAL | Status: DC
Start: 2014-03-02 — End: 2014-10-29

## 2014-03-02 MED ORDER — FUROSEMIDE 40 MG PO TABS
40 MG | ORAL_TABLET | Freq: Every day | ORAL | Status: DC
Start: 2014-03-02 — End: 2014-08-29

## 2014-03-02 MED ORDER — PHENTERMINE-TOPIRAMATE 15-92 MG PO CP24
15-92 MG | ORAL_CAPSULE | ORAL | Status: DC
Start: 2014-03-02 — End: 2014-03-30

## 2014-03-02 MED ORDER — METANX 3-35-2 MG PO TABS
3-35-2 MG | ORAL_TABLET | Freq: Two times a day (BID) | ORAL | Status: DC
Start: 2014-03-02 — End: 2015-02-21

## 2014-03-02 MED ORDER — LORATADINE 10 MG PO TABS
10 MG | ORAL_TABLET | ORAL | Status: DC
Start: 2014-03-02 — End: 2014-10-29

## 2014-03-02 MED ORDER — ESOMEPRAZOLE MAGNESIUM 40 MG PO CPDR
40 MG | ORAL_CAPSULE | Freq: Every day | ORAL | Status: DC
Start: 2014-03-02 — End: 2014-10-29

## 2014-03-02 MED ORDER — SERTRALINE HCL 100 MG PO TABS
100 MG | ORAL_TABLET | Freq: Every day | ORAL | Status: DC
Start: 2014-03-02 — End: 2014-08-29

## 2014-03-02 MED ORDER — TRAZODONE HCL 150 MG PO TABS
150 MG | ORAL_TABLET | Freq: Every evening | ORAL | Status: DC
Start: 2014-03-02 — End: 2015-02-14

## 2014-03-02 NOTE — Progress Notes (Signed)
Subjective:      Patient ID: Jenny CookeySharon J Stephens is a 48 y.o. female.    HPI Comments: Pt is here for her pap. Pt is also here for weight check. Pt would like to know if she can get a higher dose for zoloft.   Pt has no other questions or concerns. SG    CC: pap    Gynecologic Exam  The patient's pertinent negatives include no genital lesions, genital odor or pelvic pain. The patient is experiencing no pain. She is not pregnant. Pertinent negatives include no chills, constipation, diarrhea, dysuria or fever. She is sexually active. No, her partner does not have an STD. She uses nothing for contraception. Her menstrual history has been regular. (Pcos)   Says under a lot of stress with son.  Thinks zoloft needs increased.  Admits she has been taking 2 for the past few days to help.  Wants to continue with qysmia.  Still loosing weight.  Still waiting on new leg to get exercise but still following diet.    Past Medical History   Diagnosis Date   ??? Diabetes mellitus (HCC)    ??? Depression    ??? Osteoarthritis    ??? RAD (reactive airway disease)    ??? Hyperlipidemia    ??? GERD (gastroesophageal reflux disease)    ??? Allergic rhinitis    ??? Hypertension    ??? Rosacea    ??? Embolism - blood clot 2004     pe   ??? Fibromyalgia    ??? Muscle pain      axial skeletal   ??? Overactive bladder    ??? PCOS (polycystic ovarian syndrome)    ??? Decreased energy        Review of Systems   Constitutional: Negative for fever and chills.   Gastrointestinal: Negative for diarrhea and constipation.   Genitourinary: Negative for dysuria, menstrual problem and pelvic pain.   Psychiatric/Behavioral: Positive for dysphoric mood. The patient is nervous/anxious.        Objective:   Physical Exam   Constitutional: She appears well-developed and well-nourished. No distress.   Weight down another 3 lbs   Pulmonary/Chest: Right breast exhibits no inverted nipple, no mass and no tenderness. Left breast exhibits no inverted nipple, no mass and no tenderness.    Genitourinary: Vagina normal. Pelvic exam was performed with patient supine. There is no rash, tenderness or no lesion on the right labia. There is no rash, tenderness or no lesion on the left labia. Cervix exhibits no motion tenderness and no discharge. Right adnexum displays no mass, no tenderness and no fullness. Left adnexum displays no mass, no tenderness and no fullness.   Musculoskeletal:   In wheelchair, stump only right leg   Vitals reviewed.      BP 102/72    Pulse 96    Temp(Src) 98 ??F (36.7 ??C) (Oral)    Resp 17    Wt 210 lb (95.255 kg)     Assessment:      1. Screening for malignant neoplasm of the cervix    2. Depression    3. Obesity (BMI 30-39.9)             Plan:      Pap collected  Cultures declined  Refills given  Will increase zoloft to 100 mg, new rx given  Refill qsymia  Return in 1 month

## 2014-03-02 NOTE — Patient Instructions (Signed)
Pap Test: After Your Visit  Your Care Instructions  The Pap test (also called a Pap smear) is a screening test for cancer of the cervix, which is the lower part of the uterus that opens into the vagina. The test can help your doctor find early changes in the cells that could lead to cancer.  The sample of cells taken during your test has been sent to a lab so that an expert can look at the cells. It usually takes a week or two to get the results back.  Follow-up care is a key part of your treatment and safety. Be sure to make and go to all appointments, and call your doctor if you are having problems. It's also a good idea to know your test results and keep a list of the medicines you take.  What do the results mean?  ?? A normal result means that the test did not find any abnormal cells in the sample.  ?? An abnormal result can mean many things. Most of these are not cancer. The results of your test may be abnormal because:  ?? You have an infection of the vagina or cervix, such as a yeast infection.  ?? You have an IUD (intrauterine device for birth control).  ?? You have low estrogen levels after menopause that are causing the cells to change.  ?? You have cell changes that may be a sign of precancer or cancer. The results are ranked based on how serious the changes might be.  There are many other reasons why you might not get a normal result. If the results were abnormal, you may need to get another test within a few weeks or months. If the results show changes that could be a sign of cancer, you may need a test called a colposcopy, which provides a more complete view of the cervix.  Sometimes the lab cannot use the sample because it does not contain enough cells or was not preserved well. If so, you may need to have the test again. This is not common, but it does happen from time to time.  When should you call for help?  Watch closely for changes in your health, and be sure to contact your doctor if:  ?? You have  vaginal bleeding or pain for more than 2 days after the test. It is normal to have a small amount of bleeding for a day or two after the test.   Where can you learn more?   Go to [email or web address] and sign in to your MyChart account. Enter U972 in the Search Health Information box to learn more about ???Pap Test: After Your Visit.???    If you do not have an account, please click on the ???Sign Up Now??? link.     ?? 2006-2015 [employer]. Care instructions adapted under license by [facility]. This care instruction is for use with your licensed healthcare professional. If you have questions about a medical condition or this instruction, always ask your healthcare professional. Healthwise, Incorporated disclaims any warranty or liability for your use of this information.  Content Version: 10.4.390249; Current as of: January 04, 2013

## 2014-03-30 MED ORDER — PHENTERMINE-TOPIRAMATE 15-92 MG PO CP24
15-92 MG | ORAL_CAPSULE | ORAL | Status: DC
Start: 2014-03-30 — End: 2014-05-04

## 2014-03-30 MED ORDER — FLUOXETINE HCL 40 MG PO CAPS
40 MG | ORAL_CAPSULE | Freq: Every day | ORAL | Status: DC
Start: 2014-03-30 — End: 2014-05-04

## 2014-03-30 NOTE — Progress Notes (Signed)
Subjective:      Patient ID: Jenny Stephens is a 48 y.o. female.    HPI Comments: Pt is here for weight management. Pt states she has not gained her lost anything since she was last seen. Pt would like to discuss depression.  Pt has no other questions or concerns.SG    CC: weight/depression    Pt is here for follow up on qsymia.  Didn't lose any weight this month.  Says it is b/c of all the stress.  Says her depression is out of control.  Her son is back in jail.  This is a huge stress plus causing major stress on marriage.   They are starting with counseling.  She also hasn't gotten her leg which is a huge disappointment.  Not able to exercise at all.  Very sad, tearful.  Feeling hopeless.  Very worried.  Doesn't feel like zoloft is doing anything.      Review of Systems   Constitutional: Negative for fever and chills.   Respiratory: Negative for cough and shortness of breath.    Gastrointestinal: Negative for nausea and vomiting.   Musculoskeletal: Positive for gait problem.   Psychiatric/Behavioral: Positive for sleep disturbance, dysphoric mood and agitation. The patient is nervous/anxious.        Objective:   Physical Exam   Constitutional: She is oriented to person, place, and time. She appears well-developed and well-nourished. No distress.   Cardiovascular: Normal rate, regular rhythm and normal heart sounds.    Pulmonary/Chest: Effort normal and breath sounds normal. No respiratory distress.   Musculoskeletal:   In wheelchair, right stump   Neurological: She is alert and oriented to person, place, and time.   Psychiatric: Her speech is normal. She exhibits a depressed mood.   Tearful, sad   Vitals reviewed.      Assessment:      1. Depression    2. Obesity (BMI 30-39.9)             Plan:      D/c zoloft   Will try prozac, aware of use and can titrate  F/u with counselor  Refill qysmia for at least one more month.  If still no weight loss will have to consider whether to continue or not  F/u in 1 month

## 2014-05-04 MED ORDER — PHENTERMINE-TOPIRAMATE 15-92 MG PO CP24
15-92 MG | ORAL_CAPSULE | ORAL | Status: DC
Start: 2014-05-04 — End: 2014-06-04

## 2014-05-04 MED ORDER — ZOLOFT 50 MG PO TABS
50 MG | ORAL_TABLET | Freq: Every day | ORAL | Status: DC
Start: 2014-05-04 — End: 2014-10-29

## 2014-05-04 NOTE — Progress Notes (Signed)
Subjective:      Patient ID: Jenny Stephens is a 47 y.o. female.    HPI Comments: Pt here to follow up on medication. She stopped taking Prozac because it caused her to "go to a dark place." She started taking again Zoloft. Pt would like to see if zoloft can be increased.     Pt still qysmia. Pt does not think that she has lost any weight this month.     Pt has no other questions or concerns. sy      CC: depression f/u and weight    Pt is here to f/u on mood.  Says paxil made things worse.  Went to a "dark place".  More depressed and didn't like thoughts so stopped it.  Went back to taking zoloft.  Still sad.  Still having issues with fighting with husband and son being in jail.  A lot of stress.  Sleeping ok.  No suicidal thoughts.  Have sped up the leg process and hoping to have her new prosthesis within the next 2-3 weeks.  Weighed herself with her leg and added 8 lbs  Didn't lose any weight this month.  However without her leg and without being able to move very concerned with the medication helping to curb her appetite she would have gained a lot of weight.  Wants to continue for one more month and then feels when she has her leg she will stop.  Unhappy with her ENDO and wants to just follow here from now on for diabetes.     Review of Systems   Constitutional: Negative for fever and chills.   Cardiovascular: Negative for chest pain and palpitations.   Gastrointestinal: Negative for nausea and vomiting.   Musculoskeletal: Positive for gait problem.   Psychiatric/Behavioral: Positive for dysphoric mood and agitation. Negative for suicidal ideas. The patient is not nervous/anxious.        Objective:   Physical Exam   Constitutional: She appears well-developed and well-nourished. No distress.   Cardiovascular: Normal rate, regular rhythm and normal heart sounds.    No murmur heard.  Pulmonary/Chest: Effort normal and breath sounds normal. No respiratory distress.   Musculoskeletal:   In wheelchair, right below  knee amputee   Psychiatric: Her speech is normal and behavior is normal. She exhibits a depressed mood.   Quiet, sad   Vitals reviewed.      BP 90/66    Pulse 84    Temp(Src) 98 ??F (36.7 ??C) (Oral)    Resp 20    Wt 210 lb (95.255 kg)     Assessment:      1. Depression    2. Obesity (BMI 30-39.9)             Plan:      Will increase zoloft to 150 mg   New rx for 50 mg to add to 100 mg tabs  If still not enough can increase to 200 mg daily  Refill qsymia for one final month  If still not losing weight will not refill again and hopefully pt will have leg at next visit and can start being active again  Return in 1 month  Agree to manage diabetes care in the future

## 2014-05-04 NOTE — Patient Instructions (Signed)
Depression Treatment: After Your Visit  Your Care Instructions  Depression is a condition that affects the way you feel, think, and act. It causes symptoms such as low energy, loss of interest in daily activities, and sadness or grouchiness that goes on for a long time. Depression is very common and affects men and women of all ages.  Depression is a medical illness caused by changes in the natural chemicals in your brain. It is not a character flaw, and it does not mean that you are a bad or weak person. It does not mean that you are going crazy.  It is important to know that depression can be treated. Medicines, counseling, and self-care can all help. Many people do not get help because they are embarrassed or think that they will get over the depression on their own. But some people do not get better without treatment.  Follow-up care is a key part of your treatment and safety. Be sure to make and go to all appointments, and call your doctor if you are having problems. It's also a good idea to know your test results and keep a list of the medicines you take.  How can you care for yourself at home?  Learn about antidepressant medicines  Antidepressant medicines can improve or end the symptoms of depression. You may need to take the medicine for at least 6 months, and often longer. Keep taking your medicine even if you feel better. If you stop taking it too soon, your symptoms may come back or get worse.  You may start to feel better within 1 to 3 weeks of taking antidepressant medicine. But it can take as many as 6 to 8 weeks to see more improvement. Talk to your doctor if you have problems with your medicine or if you do not notice any improvement after 3 weeks.  Antidepressants can make you feel tired, dizzy, or nervous. Some people have dry mouth, constipation, headaches, sexual problems, an upset stomach, or diarrhea. Many of these side effects are mild and go away on their own after you take the medicine  for a few weeks. Some may last longer. Talk to your doctor if side effects bother you too much. You might be able to try a different medicine. If you are pregnant or breast-feeding, talk to your doctor about what medicines you can take.  Learn about counseling  In many cases, counseling can work as well as medicines to treat mild to moderate depression. Counseling is done by licensed mental health providers, such as psychologists, social workers, and some types of nurses. It can be done in one-on-one sessions or in a group setting. Many people find group sessions helpful.  Cognitive-behavioral therapy is a type of counseling. In this treatment therapy, you learn how to see and change unhelpful thinking styles that may be adding to your depression. Counseling and medicines often work well when used together.  To manage depression  ?? Be physically active. Getting 30 minutes of exercise each day is good for your body and your mind. Begin slowly if it is hard for you to get started. If you already exercise, keep it up.  ?? Plan something pleasant for yourself every day. Include activities that you have enjoyed in the past.  ?? Get enough sleep. Talk to your doctor if you have problems sleeping.  ?? Eat a balanced diet. If you do not feel hungry, eat small snacks rather than large meals.  ?? Do not drink alcohol, use   illegal drugs, or take medicines that your doctor has not prescribed for you. They may interfere with your treatment.  ?? Spend time with family and friends. It may help to speak openly about your depression with people you trust.  ?? Take your medicines exactly as prescribed. Call your doctor if you think you are having a problem with your medicine.  ?? Do not make major life decisions while you are depressed. Depression may change the way you think. You will be able to make better decisions after you feel better.  ?? Think positively. Challenge negative thoughts with statements such as "I am hopeful"; "Things will  get better"; and "I can ask for the help I need." Write down these statements and read them often, even if you don't believe them yet.  ?? Be patient with yourself. It took time for your depression to develop, and it will take time for your symptoms to improve. Do not take on too much or be too hard on yourself.  ?? Learn all you can about depression from written and online materials.  ?? Check out behavioral health classes to learn more about dealing with depression.  ?? Keep the numbers for these national suicide hotlines: 1-800-273-TALK (1-800-273-8255) and 1-800-SUICIDE (1-800-784-2433). If you or someone you know talks about suicide or feeling hopeless, get help right away.  When should you call for help?  Call 911 anytime you think you may need emergency care. For example, call if:  ?? You feel you cannot stop from hurting yourself or someone else.  Call your doctor now or seek immediate medical care if:  ?? You hear voices.  ?? You feel much more depressed.  Watch closely for changes in your health, and be sure to contact your doctor if:  ?? You are having problems with your depression medicine.  ?? You are not getting better as expected.   Where can you learn more?   Go to [email or web address] and sign in to your MyChart account. Enter G693 in the Search Health Information box to learn more about ???Depression Treatment: After Your Visit.???    If you do not have an account, please click on the ???Sign Up Now??? link.     ?? 2006-2015 Healthwise, [employer]. Care instructions adapted under license by  Health. This care instruction is for use with your licensed healthcare professional. If you have questions about a medical condition or this instruction, always ask your healthcare professional. [employer], [employer] disclaims any warranty or liability for your use of this information.  Content Version: 10.5.422740; Current as of: September 08, 2013

## 2014-06-04 MED ORDER — PHENTERMINE-TOPIRAMATE 15-92 MG PO CP24
15-92 MG | ORAL_CAPSULE | ORAL | Status: DC
Start: 2014-06-04 — End: 2014-06-26

## 2014-06-04 NOTE — Progress Notes (Signed)
Subjective:      Patient ID: Jenny Stephens is a 48 y.o. female.    HPI Comments: Pt here to discuss continuing on the qysmia. She states that she got a treadmill and now is exercising and says she has already lost 3lbs.     Pt has no other questions or concerns. sy      CC: obesity    Pt is here for f/u on qsymia.  By our scales she has gained weight but pt states she weighted herself with her new leg last month after here and was 218 lbs.  So she is down 3 lbs.  Since she has gotten new leg she has started to walk on treadmill.  Still watching diet and portions.  No adverse effects.  Still under a lot of stress at home with husband and son in jail d/t drugs.  Says started going to counselor and going to go to [employer].    Review of Systems   Cardiovascular: Negative for chest pain and palpitations.   Gastrointestinal: Negative for diarrhea and constipation.   Psychiatric/Behavioral: The patient is nervous/anxious.        Objective:   Physical Exam   Constitutional: She appears well-developed and well-nourished. No distress.   Cardiovascular: Normal rate, regular rhythm and normal heart sounds.    No murmur heard.  Pulmonary/Chest: Effort normal and breath sounds normal. No respiratory distress. She has no wheezes.   Vitals reviewed.      BP 110/68 mmHg   Pulse 84   Temp(Src) 98.2 ??F (36.8 ??C) (Oral)   Resp 16   Wt 215 lb (97.523 kg)    Assessment:      1. Obesity (BMI 30-39.9)    2. Stress due to family tension             Plan:      Now that has a leg get back to exercising regularly  Refill qsymia  Keep f/u with counselor by self and with spouse  Return in 1 month

## 2014-06-26 MED ORDER — PHENTERMINE-TOPIRAMATE 15-92 MG PO CP24
15-92 MG | ORAL_CAPSULE | ORAL | Status: DC
Start: 2014-06-26 — End: 2014-07-26

## 2014-06-26 MED ORDER — SIMVASTATIN 40 MG PO TABS
40 MG | ORAL_TABLET | Freq: Every evening | ORAL | Status: DC
Start: 2014-06-26 — End: 2014-08-29

## 2014-06-26 MED ORDER — GABAPENTIN 600 MG PO TABS
600 MG | ORAL_TABLET | ORAL | Status: DC
Start: 2014-06-26 — End: 2015-02-14

## 2014-06-26 MED ORDER — OXYBUTYNIN CHLORIDE 5 MG PO TABS
5 MG | ORAL_TABLET | Freq: Two times a day (BID) | ORAL | Status: DC
Start: 2014-06-26 — End: 2014-07-26

## 2014-06-26 NOTE — Progress Notes (Signed)
Subjective:      Patient ID: Jenny Stephens is a 47 y.o. female.    HPI Comments: Pt here for weight check.  She has lost 5 lbs. Pt would like to continue with the medication.    Pt would like to discuss gabapentin. Pt was short due to her son taking a 90 day supply and [facility] filled early, but now she will be out early.  [facility]     Pt has no other questions or concern. sy    CC: obesity    Pt is here for follow up on qsymia.  Has lost another 5 lbs.  She has increased her walking up to a mile a day.  Still watching portions.  No adverse side effects.  Still under a lot of stress with her son.  He is still in jail but they are trying to make arrangements for when he gets out.  No cp or sob.  Has realized he stole some of her neurontin prior to going to prison.  She believes she will be about 90 days short.  Says cannot go without it.  Her pain and burning becomes too intense.  Cannot take lyrica; had a bad reaction to it.      Review of Systems   Constitutional: Negative for fever and chills.   Cardiovascular: Negative for chest pain and palpitations.   Gastrointestinal: Negative for nausea and vomiting.   Musculoskeletal: Positive for myalgias and gait problem.   Psychiatric/Behavioral: Negative for dysphoric mood. The patient is nervous/anxious.        Objective:   Physical Exam   Constitutional: She is oriented to person, place, and time. She appears well-developed and well-nourished. No distress.   Cardiovascular: Normal rate, regular rhythm and normal heart sounds.    No murmur heard.  Pulmonary/Chest: Effort normal and breath sounds normal. No respiratory distress.   Neurological: She is alert and oriented to person, place, and time.   Psychiatric: Her speech is normal. Her mood appears anxious.   Vitals reviewed.      BP 92/64 mmHg   Pulse 84   Temp(Src) 98.6 ??F (37 ??C) (Oral)   Resp 20   Wt 210 lb (95.255 kg)    Assessment:      1. Obesity (BMI 30-39.9)    2.  Fibromyalgia    3. Overactive bladder             Plan:      Refill qsymia for another month  Continue to work on diet and improving exercise now that has the leg  Will attempt to call mail order and authorize an early fill on neurontin but pt advised it may be her insurance that refuses to pay and may have to pay out of pocket  Refill oxybutynin

## 2014-07-26 ENCOUNTER — Ambulatory Visit: Admit: 2014-07-26 | Discharge: 2014-07-26 | Payer: MEDICARE | Attending: Family Medicine

## 2014-07-26 DIAGNOSIS — R262 Difficulty in walking, not elsewhere classified: Secondary | ICD-10-CM

## 2014-07-26 MED ORDER — PHENTERMINE-TOPIRAMATE 15-92 MG PO CP24
15-92 MG | ORAL_CAPSULE | ORAL | Status: DC
Start: 2014-07-26 — End: 2014-09-28

## 2014-07-26 NOTE — Progress Notes (Signed)
Subjective:      Patient ID: Jenny Stephens is a 48 y.o. female.    HPI Comments: Pt here for weight check.  She has lost 1 lbs. Pt would like to continue with the medication.     Pt would like to get the flu shot and discuss whether she should get the pneumonia shot.     Pt states she needs new wheelchair and has a paper with the recommendations on how the letter should read so that she can qualify.     Pt has no other questions or concern. sy      CC: wheelchair, weight    Pt is here b/c [facility]  Had evaluation b/c needs new chair.  Earlier this year was in the chair full time while waiting for new prosthesis and said now her w/c is worn out.  Uses wheelchair when she is fatigued with leg and has to go long distances.    She is taking her qsymia.  According to her scale she lost 2 lbs.  Still trying to get under 200 lbs.  Has been watching diet and portions.  Walking 1-2 miles on her treadmill.    Review of Systems   Constitutional: Negative for fever and chills.   Respiratory: Negative for cough and shortness of breath.    Cardiovascular: Negative for chest pain and palpitations.   Gastrointestinal: Negative for nausea and vomiting.   Musculoskeletal: Positive for arthralgias and gait problem.       Objective:   Physical Exam   Constitutional: She appears well-developed and well-nourished. No distress.   Cardiovascular: Normal rate, regular rhythm and normal heart sounds.    No murmur heard.  Pulmonary/Chest: Effort normal and breath sounds normal. No respiratory distress. She has no wheezes.   Musculoskeletal:   Right BKA but with prosthesis on today   Vitals reviewed.      BP 100/62 mmHg   Pulse 80   Temp(Src) 97.7 ??F (36.5 ??C) (Oral)   Resp 16   Wt 209 lb 9.6 oz (95.074 kg)    Assessment:      1. Difficulty walking    2. Status post below knee amputation of right lower extremity (HCC)    3. Obesity (BMI 30-39.9)    4. Need for prophylactic vaccination and inoculation against  influenza             Plan:      Pt was seen for face to face visit discussing need for manual w/c and I concur with PT evaluation.  Pt requires manual w/c due to right BKA when unable to wear prosthesis for completion of MRADL's in home   Script signed   Refill qsymia  Flu vaccine given  Return in 1 month

## 2014-07-31 MED ORDER — POTASSIUM CHLORIDE 20 MEQ/15ML (10%) PO LIQD
20 MEQ/15ML (10%) | Freq: Every day | ORAL | Status: DC
Start: 2014-07-31 — End: 2017-03-05

## 2014-07-31 NOTE — Telephone Encounter (Signed)
Pt last seen by Dr Goldsmith on 07/26/2014

## 2014-07-31 NOTE — Telephone Encounter (Signed)
Rx sent

## 2014-08-27 ENCOUNTER — Encounter: Attending: Family Medicine

## 2014-08-29 ENCOUNTER — Ambulatory Visit: Admit: 2014-08-29 | Discharge: 2014-08-29 | Payer: MEDICARE | Attending: Family Medicine

## 2014-08-29 DIAGNOSIS — E669 Obesity, unspecified: Secondary | ICD-10-CM

## 2014-08-29 MED ORDER — PHENTERMINE-TOPIRAMATE 15-92 MG PO CP24
15-92 MG | ORAL_CAPSULE | ORAL | Status: DC
Start: 2014-08-29 — End: 2014-09-28

## 2014-08-29 MED ORDER — FUROSEMIDE 40 MG PO TABS
40 MG | ORAL_TABLET | Freq: Every day | ORAL | Status: DC
Start: 2014-08-29 — End: 2015-02-14

## 2014-08-29 MED ORDER — ALBUTEROL SULFATE HFA 108 (90 BASE) MCG/ACT IN AERS
108 (90 Base) MCG/ACT | Freq: Four times a day (QID) | RESPIRATORY_TRACT | Status: AC | PRN
Start: 2014-08-29 — End: ?

## 2014-08-29 MED ORDER — SIMVASTATIN 40 MG PO TABS
40 MG | ORAL_TABLET | Freq: Every evening | ORAL | Status: DC
Start: 2014-08-29 — End: 2014-08-30

## 2014-08-29 MED ORDER — SITAGLIPTIN PHOSPHATE 100 MG PO TABS
100 MG | ORAL_TABLET | Freq: Every day | ORAL | Status: DC
Start: 2014-08-29 — End: 2014-10-29

## 2014-08-29 NOTE — Progress Notes (Signed)
Subjective:      Patient ID: Jenny CookeySharon J Stephens is a 48 y.o. female.    HPI Comments: Pt here for weight check.  She has lost 2 lbs. Pt would like to continue with the medication.     Pt is also requesting refills today.    Pt has no other questions or concern. AM    CC: obesity, diabetes    Pt is here following another month of qsymia use.  Lost another 2 lbs even though she was on vacation.  Continues to monitor diet closely.  Going to start exercising regularly again.  Wants to keep going.  Still trying to get under 200 lbs  Treatment Adherence:   Medication compliance:  compliant all of the time  Diet compliance:  compliant all of the time  Weight trend: decreasing  Current exercise: exercises on treadmill  What might prevent you from meeting your goal?: impairment:  physical: only one leg  Patient plan for overcoming barriers: losing weight     Patient Confidence: 10/10      Diabetes Mellitus Type 2: Current symptoms/problems include none.    Home blood sugar records:  fasting range: 100 or less  Any episodes of hypoglycemia? no  Eye exam current (within one year): yes  Tobacco history: She  reports that she has never smoked. She has never used smokeless tobacco.   Daily Aspirin? No: n/a  Known diabetic complications: none    Hypertension:  Home blood pressure monitoring: No.  She is adherent to a low sodium diet. Patient denies chest pain and shortness of breath.  Antihypertensive medication side effects: no medication side effects noted.  Use of agents associated with hypertension: none.     Hyperlipidemia:  No new myalgias or GI upset on simvastatin (Zocor).       Lab Results   Component Value Date    LABA1C 5.4 08/29/2014    LABA1C 6.5* 06/01/2013    LABA1C 6.6* 12/13/2012     Lab Results   Component Value Date    LABMICR 1.0 02/10/2012    CREATININE 0.81 08/29/2014     Lab Results   Component Value Date    ALT 19 08/29/2014    AST 30 08/29/2014     Lab Results   Component Value Date    CHOL 198 08/29/2014     TRIG 93 08/29/2014    HDL 66* 08/29/2014    LDLCALC 113 08/29/2014            Review of Systems   Constitutional: Negative for fever and chills.   Respiratory: Negative for cough and shortness of breath.    Cardiovascular: Negative for chest pain and palpitations.   Gastrointestinal: Negative for nausea and vomiting.       Objective:   Physical Exam   Constitutional: She appears well-developed and well-nourished. No distress.   Cardiovascular: Normal rate, regular rhythm and normal heart sounds.    No murmur heard.  Pulmonary/Chest: Effort normal and breath sounds normal. No respiratory distress. She has no wheezes.   Psychiatric: She has a normal mood and affect. Her behavior is normal.   Vitals reviewed.      BP 110/70 mmHg   Pulse 82   Temp(Src) 98.5 ??F (36.9 ??C) (Oral)   Resp 16   Wt 207 lb 6.4 oz (94.076 kg)   LMP 01/27/2014    Assessment:      1. Obesity (BMI 30-39.9)    2. Type 2 diabetes mellitus, controlled (HCC)  3. Dyslipidemia    4. Essential hypertension             Plan:      Refill qsymia for another month  Continue with healthy diet and exercise  Fasting labs today  Refills given  Diabetes Counseling   Patient was again counseled regarding disease risks and need to adopt/ maintain a healthy lifestyle, proceed with self management in the home setting by keeping logs to record blood sugar as frequently as advised, blood pressure, caloric intake, weight and physical activity level. Patient was instructed to keep logs up-to-date and to always bring logs to all office visits.

## 2014-08-30 LAB — LIPID PANEL
Cholesterol, Total: 198 mg/dL (ref 0–199)
HDL: 66 mg/dL — ABNORMAL HIGH (ref 40–59)
LDL Calculated: 113 mg/dL (ref 0–129)
Triglycerides: 93 mg/dL (ref 0–200)

## 2014-08-30 LAB — COMPREHENSIVE METABOLIC PANEL
ALT: 19 U/L (ref 0–33)
AST: 30 U/L (ref 0–35)
Albumin: 4.2 g/dL (ref 3.9–4.9)
Alkaline Phosphatase: 81 U/L (ref 40–130)
Anion Gap: 12 mEq/L (ref 7–13)
BUN: 16 mg/dL (ref 6–20)
CO2: 28 mEq/L (ref 22–29)
Calcium: 9.5 mg/dL (ref 8.6–10.2)
Chloride: 98 mEq/L (ref 98–107)
Creatinine: 0.81 mg/dL (ref 0.50–0.90)
GFR African American: >60 (ref >60)
GFR Non-African American: >60 (ref >60)
Globulin: 2.2 g/dL — ABNORMAL LOW (ref 2.3–3.5)
Glucose: 105 mg/dL (ref 74–109)
Potassium: 3.5 mEq/L (ref 3.5–5.1)
Sodium: 138 mEq/L (ref 132–144)
Total Bilirubin: 0.3 mg/dL (ref 0.0–1.2)
Total Protein: 6.4 g/dL (ref 6.4–8.1)

## 2014-08-30 LAB — CBC WITH DIFFERENTIAL
Basophils %: 0.7 %
Basophils Absolute: 0 10*3/uL (ref 0.0–0.2)
Eosinophils %: 3.5 %
Eosinophils Absolute: 0.2 10*3/uL (ref 0.0–0.7)
Hematocrit: 41.6 % (ref 37.0–47.0)
Hemoglobin: 14 g/dL (ref 12.0–16.0)
Lymphocytes %: 28 %
Lymphocytes Absolute: 1.7 10*3/uL (ref 1.0–4.8)
MCH: 32 pg — ABNORMAL HIGH (ref 27.0–31.3)
MCHC: 33.6 % (ref 33.0–37.0)
MCV: 95.1 fL (ref 82.0–100.0)
Monocytes %: 8.2 %
Monocytes Absolute: 0.5 10*3/uL (ref 0.2–0.8)
Neutrophils %: 59.6 %
Neutrophils Absolute: 3.7 10*3/uL (ref 1.4–6.5)
Platelets: 300 10*3/uL (ref 130–400)
RBC: 4.37 M/uL (ref 4.20–5.40)
RDW: 13.8 % (ref 11.5–14.5)
WBC: 6.2 10*3/uL (ref 4.8–10.8)

## 2014-08-30 LAB — HEMOGLOBIN A1C: Hemoglobin A1C: 5.4 % (ref 4.8–5.9)

## 2014-08-30 MED ORDER — SIMVASTATIN 80 MG PO TABS
80 MG | ORAL_TABLET | Freq: Every evening | ORAL | Status: DC
Start: 2014-08-30 — End: 2015-02-14

## 2014-08-30 NOTE — Telephone Encounter (Signed)
Aware of results. She said the Zocor should probably be raised.

## 2014-08-30 NOTE — Telephone Encounter (Signed)
New rx sent to [facility]

## 2014-08-30 NOTE — Telephone Encounter (Signed)
-----   Message from Barrett HenleShannon Goldsmith, DO sent at 08/30/2014  6:52 AM EST -----  Notify pt that her sugar is amazing! A1c is down to 5.4.  Likely due in part to all of her weight loss.  Potassium is normal but at the very low end so unfortunately really should stay on it.  Cholesterol numbers went up a little bit though.  Does she think she can improve it or do I need to raise her zocor?

## 2014-09-28 ENCOUNTER — Ambulatory Visit: Admit: 2014-09-28 | Discharge: 2014-09-28 | Payer: MEDICARE | Attending: Family Medicine

## 2014-09-28 DIAGNOSIS — E669 Obesity, unspecified: Secondary | ICD-10-CM

## 2014-09-28 MED ORDER — PHENTERMINE-TOPIRAMATE 15-92 MG PO CP24
15-92 MG | ORAL_CAPSULE | ORAL | Status: DC
Start: 2014-09-28 — End: 2015-02-14

## 2014-09-28 NOTE — Progress Notes (Signed)
Subjective:      Patient ID: Jenny CookeySharon J Stephens is a 48 y.o. female.    HPI Comments: Pt here for weight check.  She has lost 1 lbs. Pt would like to continue with the medication.     Pt has no other questions or concern. AM        CC: obesity    Pt is here for f/u on qsymia.  She lost another 1-2 lbs this month.  Continues to walk 2.5 miles on treadmill several times a week.  Diet remains good.  Portions good.  No medication side effects.  Still wants to get to 200 lbs before stopping the medication.    Review of Systems   Constitutional: Negative for fever and chills.   Cardiovascular: Negative for chest pain and palpitations.   Gastrointestinal: Negative for diarrhea and constipation.       Objective:   Physical Exam   Constitutional: She appears well-developed and well-nourished. No distress.   Cardiovascular: Normal rate, regular rhythm and normal heart sounds.    No murmur heard.  Pulmonary/Chest: Effort normal and breath sounds normal. No respiratory distress.   Vitals reviewed.      BP 114/70 mmHg   Pulse 84   Temp(Src) 98.5 ??F (36.9 ??C) (Oral)   Resp 20   Wt 206 lb 3.2 oz (93.532 kg)   LMP 01/27/2014    Assessment:      1. Obesity (BMI 30-39.9)             Plan:      Refill qsymia since still losing  Continue to increase exercise and diet  F/u in 1 month

## 2014-09-28 NOTE — Patient Instructions (Signed)
Body Mass Index: Care Instructions  Your Care Instructions     Body mass index (BMI) can help you see if your weight is raising your risk for health problems. It uses a formula to compare how much you weigh with how tall you are. A BMI between 18.5 and 24.9 is considered healthy. A BMI between 25 and 29.9 is considered overweight. A BMI of 30 or higher is considered obese.  If your BMI is in the normal range, it means that you have a lower risk for weight-related health problems. If your BMI is in the overweight or obese range, you may be at increased risk for weight-related health problems, such as high blood pressure, heart disease, stroke, arthritis or joint pain, and diabetes.  BMI is just one measure of your risk for weight-related health problems. You may be at higher risk for health problems if you are not active, you eat an unhealthy diet, or you drink too much alcohol or use tobacco products.  Follow-up care is a key part of your treatment and safety. Be sure to make and go to all appointments, and call your doctor if you are having problems. It???s also a good idea to know your test results and keep a list of the medicines you take.  How can you care for yourself at home?  ?? Practice healthy eating habits. This includes eating plenty of fruits, vegetables, whole grains, lean protein, and low-fat dairy.  ?? Get at least 30 minutes of exercise 4 to 5 days a week or more. Brisk walking is a good choice. You also may want to do other activities, such as running, swimming, cycling, or playing tennis or team sports.  ?? Do not smoke. Smoking can increase your risk for health problems. If you need help quitting, talk to your doctor about stop-smoking programs and medicines. These can increase your chances of quitting for good.  ?? Limit alcohol to 2 drinks a day for men and 1 drink a day for women. Too much alcohol can cause health problems.  If you have a BMI higher than 25  ?? Your doctor may do other tests to  check your risk for weight-related health problems. This may include measuring the distance around your waist. A waist measurement of more than 40 inches in men or 35 inches in women can increase the risk of weight-related health problems.  ?? Talk with your doctor about steps you can take to stay healthy or improve your health. You may need to make lifestyle changes to lose weight and stay healthy, such as changing your diet and getting regular exercise.   Where can you learn more?   Go to [email or web address] and sign in to your MyChart account. Enter S176 in the Search Health Information box to learn more about ???Body Mass Index: Care Instructions.???    If you do not have an account, please click on the ???Sign Up Now??? link.     ?? 2006-2015 [employer]. Care instructions adapted under license by [facility]. This care instruction is for use with your licensed healthcare professional. If you have questions about a medical condition or this instruction, always ask your healthcare professional. Healthwise, Incorporated disclaims any warranty or liability for your use of this information.  Content Version: 10.6.465758; Current as of: December 15, 2013

## 2014-10-26 HISTORY — PX: COLONOSCOPY: SHX174

## 2014-10-29 ENCOUNTER — Ambulatory Visit: Admit: 2014-10-29 | Discharge: 2014-10-29 | Payer: MEDICARE | Attending: Family Medicine

## 2014-10-29 DIAGNOSIS — E669 Obesity, unspecified: Secondary | ICD-10-CM

## 2014-10-29 MED ORDER — ESOMEPRAZOLE MAGNESIUM 40 MG PO CPDR
40 MG | ORAL_CAPSULE | Freq: Every day | ORAL | Status: DC
Start: 2014-10-29 — End: 2015-02-14

## 2014-10-29 MED ORDER — LORATADINE 10 MG PO TABS
10 MG | ORAL_TABLET | ORAL | Status: DC
Start: 2014-10-29 — End: 2015-02-14

## 2014-10-29 MED ORDER — SITAGLIPTIN PHOSPHATE 100 MG PO TABS
100 MG | ORAL_TABLET | Freq: Every day | ORAL | Status: DC
Start: 2014-10-29 — End: 2015-02-14

## 2014-10-29 MED ORDER — CELECOXIB 200 MG PO CAPS
200 MG | ORAL_CAPSULE | Freq: Two times a day (BID) | ORAL | Status: DC
Start: 2014-10-29 — End: 2014-11-08

## 2014-10-29 MED ORDER — LISINOPRIL-HYDROCHLOROTHIAZIDE 20-25 MG PO TABS
20-25 MG | ORAL_TABLET | Freq: Every day | ORAL | Status: DC
Start: 2014-10-29 — End: 2015-02-14

## 2014-10-29 MED ORDER — OXYBUTYNIN CHLORIDE 5 MG PO TABS
5 MG | ORAL_TABLET | Freq: Two times a day (BID) | ORAL | Status: DC
Start: 2014-10-29 — End: 2015-02-14

## 2014-10-29 MED ORDER — SERTRALINE HCL 50 MG PO TABS
50 MG | ORAL_TABLET | Freq: Every day | ORAL | Status: DC
Start: 2014-10-29 — End: 2015-02-14
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
## 2014-10-30 MED ORDER — DUODERM CGF DRESSING EX MISC
CUTANEOUS | Status: DC
Start: 2014-10-30 — End: 2015-02-14

## 2014-10-30 NOTE — Telephone Encounter (Signed)
I sent an rx for a dressing.  Not sure if insurance will cover.  It gets changed every 3 days.  If doesn't cover and too expensive then just wait and discuss with her prosthetist tomorrow

## 2014-10-30 NOTE — Telephone Encounter (Signed)
Jasmine DecemberSharon called this morning wondering if you had a chance to call the pharmacy to talk about the bandage for her leg?

## 2014-10-30 NOTE — Telephone Encounter (Signed)
Patient contacted and made aware.

## 2014-11-08 MED ORDER — CELECOXIB 200 MG PO CAPS
200 MG | ORAL_CAPSULE | Freq: Two times a day (BID) | ORAL | Status: DC
Start: 2014-11-08 — End: 2015-02-14

## 2014-11-08 MED ORDER — GLUCOSE BLOOD VI STRP
Status: AC
Start: 2014-11-08 — End: ?

## 2014-11-08 NOTE — Telephone Encounter (Signed)
Rx sent

## 2014-11-08 NOTE — Telephone Encounter (Signed)
Last seen 08/2014

## 2014-11-08 NOTE — Telephone Encounter (Signed)
Pt last seen by Dr Titus DubinGoldsmith on 10/29/2014, Pt states this medication was sent in for a 30 day supply to her mail order pharmacy and it is very expensive to get 30 days at a time. Pt would like to know if a 90 day supply would be sent to her mail order pharmacy.

## 2015-02-14 ENCOUNTER — Ambulatory Visit: Admit: 2015-02-14 | Discharge: 2015-02-14 | Payer: MEDICARE | Attending: Family Medicine

## 2015-02-14 DIAGNOSIS — E119 Type 2 diabetes mellitus without complications: Secondary | ICD-10-CM

## 2015-02-14 LAB — POCT GLYCOSYLATED HEMOGLOBIN (HGB A1C): Hemoglobin A1C: 5.7 %

## 2015-02-14 MED ORDER — ESOMEPRAZOLE MAGNESIUM 40 MG PO CPDR
40 MG | ORAL_CAPSULE | Freq: Every day | ORAL | Status: DC
Start: 2015-02-14 — End: 2017-03-05

## 2015-02-14 MED ORDER — SERTRALINE HCL 100 MG PO TABS
100 MG | ORAL_TABLET | Freq: Every day | ORAL | Status: AC
Start: 2015-02-14 — End: ?

## 2015-02-14 MED ORDER — LORATADINE 10 MG PO TABS
10 MG | ORAL_TABLET | ORAL | Status: DC
Start: 2015-02-14 — End: 2017-03-05

## 2015-02-14 MED ORDER — OXYBUTYNIN CHLORIDE 5 MG PO TABS
5 MG | ORAL_TABLET | Freq: Two times a day (BID) | ORAL | Status: AC
Start: 2015-02-14 — End: ?

## 2015-02-14 MED ORDER — HYDROCODONE-ACETAMINOPHEN 5-325 MG PO TABS
5-325 MG | ORAL_TABLET | ORAL | Status: DC
Start: 2015-02-14 — End: 2017-03-05

## 2015-02-14 MED ORDER — CELEBREX 200 MG PO CAPS
200 MG | ORAL_CAPSULE | Freq: Two times a day (BID) | ORAL | Status: AC
Start: 2015-02-14 — End: ?

## 2015-02-14 MED ORDER — SIMVASTATIN 80 MG PO TABS
80 MG | ORAL_TABLET | Freq: Every evening | ORAL | Status: AC
Start: 2015-02-14 — End: ?

## 2015-02-14 MED ORDER — LISINOPRIL-HYDROCHLOROTHIAZIDE 20-25 MG PO TABS
20-25 MG | ORAL_TABLET | Freq: Every day | ORAL | Status: DC
Start: 2015-02-14 — End: 2017-03-05

## 2015-02-14 MED ORDER — SITAGLIPTIN PHOSPHATE 100 MG PO TABS
100 MG | ORAL_TABLET | Freq: Every day | ORAL | Status: DC
Start: 2015-02-14 — End: 2017-03-05

## 2015-02-14 MED ORDER — GABAPENTIN 600 MG PO TABS
600 | ORAL_TABLET | ORAL | 1.00 refills | 30.00000 days | Status: DC
Start: 2015-02-14 — End: 2024-05-23

## 2015-02-14 MED ORDER — TRAZODONE HCL 150 MG PO TABS
150 MG | ORAL_TABLET | Freq: Every evening | ORAL | Status: AC
Start: 2015-02-14 — End: ?

## 2015-02-14 MED ORDER — FUROSEMIDE 40 MG PO TABS
40 | ORAL_TABLET | Freq: Every day | ORAL | 1.00 refills | 30.00000 days | Status: AC
Start: 2015-02-14 — End: 2024-11-23

## 2015-02-14 NOTE — Progress Notes (Signed)
Subjective:      Patient ID: Jenny CookeySharon J Stephens is a 49 y.o. female.    HPI Comments: CC: spot on back, refills    Jenny Stephens is here today because she has a spot on her back that she'd like looked at. She doesn't know if this is a bruise or a rash but she states nothing has changed for it to be an allergy and she hasn't fallen. This is on the left side by her bra line. She states it doesn't hurt or itch but it looks welty. She'd also like to discuss a pain medication. She states she isn't sure if its because she was switched to generic Celebrex but her entire body is sore.   She would like refills today but she'd like all of them printed except for Gabapentin, Nexium and Celebrex. She'd like Nexium and Celebrex to be the name brand b/c works better.  Her and her husband are moving next week to [place].  She is upset about her weight gain.  Asking to restart weight loss medication.  Says she still is exercising.        Review of Systems   Constitutional: Positive for unexpected weight change. Negative for fever and chills.   Respiratory: Negative for cough and shortness of breath.    Cardiovascular: Negative for chest pain and palpitations.   Gastrointestinal: Negative for nausea and vomiting.   Musculoskeletal: Positive for back pain and arthralgias.   Skin: Positive for color change. Negative for rash and wound.       Objective:   Physical Exam   Constitutional: She is oriented to person, place, and time. She appears well-developed and well-nourished. No distress.   Cardiovascular: Normal rate, regular rhythm and normal heart sounds.    No murmur heard.  Pulmonary/Chest: Effort normal and breath sounds normal. No respiratory distress. She has no wheezes.   Neurological: She is alert and oriented to person, place, and time.   Skin: Skin is warm and dry. Bruising (left flank) noted.   Vitals reviewed.      BP 122/84 mmHg   Pulse 64   Temp(Src) 98.2 ??F (36.8 ??C) (Oral)   Resp 15   Wt 232 lb (105.235 kg)   LMP  04/15/2014    Assessment:      1. Type 2 diabetes mellitus, controlled (HCC)    2. Essential hypertension    3. Fibromyalgia    4. Bruise             Plan:      A1c is good  All medications refilled   Declined to start any more weight loss medications with pt and advised [facility] [place] [facility] [place] [facility] [place]  Monitor bruise, nothing needs done at this time

## 2015-02-21 MED ORDER — METANX 3-35-2 MG PO TABS
3-35-2 MG | ORAL_TABLET | Freq: Two times a day (BID) | ORAL | Status: AC
Start: 2015-02-21 — End: ?

## 2016-02-19 ENCOUNTER — Ambulatory Visit: Payer: Medicare HMO | Attending: Orthopedic Surgery | Admitting: Physical Therapy

## 2016-02-19 ENCOUNTER — Encounter: Payer: Self-pay | Admitting: Physical Therapy

## 2016-02-19 DIAGNOSIS — M25612 Stiffness of left shoulder, not elsewhere classified: Secondary | ICD-10-CM | POA: Diagnosis present

## 2016-02-19 DIAGNOSIS — M25512 Pain in left shoulder: Secondary | ICD-10-CM
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
## 2016-02-26 ENCOUNTER — Ambulatory Visit: Payer: Medicare HMO | Attending: Orthopedic Surgery

## 2016-02-26 DIAGNOSIS — M25512 Pain in left shoulder: Secondary | ICD-10-CM | POA: Diagnosis not present

## 2016-02-26 DIAGNOSIS — M25612 Stiffness of left shoulder, not elsewhere classified: Secondary | ICD-10-CM | POA: Diagnosis present
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
## 2016-03-04 ENCOUNTER — Ambulatory Visit: Payer: Medicare HMO

## 2016-03-04 DIAGNOSIS — M25612 Stiffness of left shoulder, not elsewhere classified: Secondary | ICD-10-CM

## 2016-03-04 DIAGNOSIS — M25512 Pain in left shoulder: Secondary | ICD-10-CM
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
## 2016-03-11 ENCOUNTER — Ambulatory Visit: Payer: Medicare HMO | Admitting: Physical Therapy

## 2016-03-11 DIAGNOSIS — M25512 Pain in left shoulder: Secondary | ICD-10-CM

## 2016-03-11 DIAGNOSIS — M25612 Stiffness of left shoulder, not elsewhere classified: Secondary | ICD-10-CM
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
## 2016-03-18 ENCOUNTER — Ambulatory Visit: Payer: Medicare HMO | Admitting: Physical Therapy

## 2016-03-18 DIAGNOSIS — M25512 Pain in left shoulder: Secondary | ICD-10-CM | POA: Diagnosis not present

## 2016-03-18 DIAGNOSIS — M25612 Stiffness of left shoulder, not elsewhere classified: Secondary | ICD-10-CM
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
## 2016-03-25 ENCOUNTER — Ambulatory Visit: Payer: Medicare HMO

## 2016-03-25 DIAGNOSIS — M25612 Stiffness of left shoulder, not elsewhere classified: Secondary | ICD-10-CM

## 2016-03-25 DIAGNOSIS — M25512 Pain in left shoulder: Secondary | ICD-10-CM
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
## 2016-04-01 ENCOUNTER — Ambulatory Visit: Payer: Medicare HMO | Attending: Orthopedic Surgery

## 2016-04-01 DIAGNOSIS — M25612 Stiffness of left shoulder, not elsewhere classified: Secondary | ICD-10-CM

## 2016-04-01 DIAGNOSIS — M25512 Pain in left shoulder: Secondary | ICD-10-CM
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
## 2016-04-08 ENCOUNTER — Ambulatory Visit: Payer: Medicare HMO | Admitting: Physical Therapy

## 2016-04-08 DIAGNOSIS — M25612 Stiffness of left shoulder, not elsewhere classified: Secondary | ICD-10-CM

## 2016-04-08 DIAGNOSIS — M25512 Pain in left shoulder: Secondary | ICD-10-CM | POA: Diagnosis not present
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
## 2016-04-15 ENCOUNTER — Ambulatory Visit: Payer: Medicare HMO

## 2016-04-22 ENCOUNTER — Ambulatory Visit: Payer: Medicare HMO | Admitting: Physical Therapy

## 2016-04-22 DIAGNOSIS — M25512 Pain in left shoulder: Secondary | ICD-10-CM | POA: Diagnosis not present

## 2016-04-22 DIAGNOSIS — M25612 Stiffness of left shoulder, not elsewhere classified: Secondary | ICD-10-CM
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
## 2016-05-12 ENCOUNTER — Encounter: Payer: Self-pay | Admitting: Pediatrics

## 2016-05-12 ENCOUNTER — Ambulatory Visit (INDEPENDENT_AMBULATORY_CARE_PROVIDER_SITE_OTHER): Payer: Medicare HMO | Admitting: Pediatrics

## 2016-05-12 VITALS — BP 118/80 | HR 80 | Temp 98.2°F | Resp 16 | Ht 64.0 in | Wt 269.6 lb

## 2016-05-12 DIAGNOSIS — K219 Gastro-esophageal reflux disease without esophagitis: Secondary | ICD-10-CM | POA: Diagnosis not present

## 2016-05-12 DIAGNOSIS — I10 Essential (primary) hypertension: Secondary | ICD-10-CM | POA: Diagnosis not present

## 2016-05-12 DIAGNOSIS — T7800XA Anaphylactic reaction due to unspecified food, initial encounter: Secondary | ICD-10-CM | POA: Insufficient documentation

## 2016-05-12 DIAGNOSIS — Z89511 Acquired absence of right leg below knee: Secondary | ICD-10-CM

## 2016-05-12 DIAGNOSIS — J4550 Severe persistent asthma, uncomplicated: Secondary | ICD-10-CM | POA: Diagnosis not present

## 2016-05-12 NOTE — Patient Instructions (Addendum)
Continue on your current medications Stop loratadine and come back to the office in 3 days to do allergy skin testing
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
## 2016-05-15 ENCOUNTER — Ambulatory Visit (INDEPENDENT_AMBULATORY_CARE_PROVIDER_SITE_OTHER): Payer: Medicare HMO | Admitting: Pediatrics

## 2016-05-15 ENCOUNTER — Encounter: Payer: Self-pay | Admitting: Pediatrics

## 2016-05-15 VITALS — BP 132/76 | HR 88 | Temp 97.8°F | Resp 16

## 2016-05-15 DIAGNOSIS — J4550 Severe persistent asthma, uncomplicated: Secondary | ICD-10-CM

## 2016-05-15 DIAGNOSIS — D849 Immunodeficiency, unspecified: Secondary | ICD-10-CM | POA: Insufficient documentation

## 2016-05-15 DIAGNOSIS — T63481A Toxic effect of venom of other arthropod, accidental (unintentional), initial encounter: Secondary | ICD-10-CM | POA: Insufficient documentation

## 2016-05-15 DIAGNOSIS — I10 Essential (primary) hypertension: Secondary | ICD-10-CM

## 2016-05-15 DIAGNOSIS — D8489 Other immunodeficiencies: Secondary | ICD-10-CM

## 2016-05-15 DIAGNOSIS — J301 Allergic rhinitis due to pollen: Secondary | ICD-10-CM | POA: Insufficient documentation

## 2016-05-15 DIAGNOSIS — K219 Gastro-esophageal reflux disease without esophagitis: Secondary | ICD-10-CM

## 2016-05-15 DIAGNOSIS — Z89511 Acquired absence of right leg below knee: Secondary | ICD-10-CM

## 2016-05-15 DIAGNOSIS — T7800XA Anaphylactic reaction due to unspecified food, initial encounter: Secondary | ICD-10-CM | POA: Insufficient documentation

## 2016-05-15 DIAGNOSIS — T63481D Toxic effect of venom of other arthropod, accidental (unintentional), subsequent encounter: Secondary | ICD-10-CM

## 2016-05-15 MED ORDER — MOMETASONE FURO-FORMOTEROL FUM 200-5 MCG/ACT IN AERO: 2.0000 | INHALATION_SPRAY | Freq: Two times a day (BID) | RESPIRATORY_TRACT | Status: DC

## 2016-05-15 MED ORDER — ALBUTEROL SULFATE (2.5 MG/3ML) 0.083% IN NEBU: 2.5000 mg | INHALATION_SOLUTION | RESPIRATORY_TRACT | Status: DC | PRN

## 2016-05-15 NOTE — Patient Instructions (Signed)
Environmental control of dust and mold Dulera 200- 2 puffs every 12 hours instead of Symbicort 160 Montelukast  10 mg once a day for coughing or wheezing Claritin 10 mg once a day for runny nose or itching Fluticasone 2 sprays per nostril once a day for stuffy nose Pro-air 2 puffs every 4 hours if needed for wheezing or coughing spells or instead albuterol 0.083% one unit dose every 4 hours if needed Continue on your other medications  Until you  get the results of your lab work, avoid fish, tree nuts including pecans and walnuts, shellfish  including  lobster. If you have an allergic reaction take Benadryl 50 mg every 4 hours and if you have life-threatening symptoms inject with EpiPen 0.3 mg. Follow similar instructions for an insect sting

## 2016-05-15 NOTE — Progress Notes (Signed)
[place] [id number] Dept: [phone]  FOLLOW UP NOTE  Patient ID: Joanna [name], female    DOB: [birth year]  Age: 50 y.o. MRN: [id number] Date of Office Visit: 05/15/2016  Assessment Chief Complaint: Allergy Testing  HPI Jahniya Redstone presents for allergy testing to evaluate her asthma and allergic rhinitis and food allergies.. She reports that 7 years ago following an insect sting, she developed hives, swelling of her throat and shortness of breath.   Drug Allergies:  Allergies  Allergen Reactions  . Bee Venom   . Penicillins Swelling  . Sulfa Antibiotics Swelling    Physical Exam: BP 132/76 mmHg  Pulse 88  Temp(Src) 97.8 F (36.6 C) (Oral)  Resp 16  SpO2 94%   Physical Exam  Constitutional: She is oriented to person, place, and time. She appears well-developed and well-nourished.  HENT:  Eyes normal. Ears normal. Nose normal. Pharynx normal.  Neck: Neck supple.  Cardiovascular:  S1 and S2 normal no murmurs  Pulmonary/Chest:  Clear to percussion auscultation  Lymphadenopathy:    She has no cervical adenopathy.  Neurological: She is alert and oriented to person, place, and time.  Psychiatric: She has a normal mood and affect. Her behavior is normal. Judgment and thought content normal.  Vitals reviewed.   Diagnostics:  Allergy skin tests were extremely positive to grass pollens, weeds, tree pollens, molds, cat, dog, dust mites and pecan . Skin testing to Aspergillus was negative  Assessment and Plan: 1. Severe persistent asthma, uncomplicated   2. Anaphylactic shock due to food, initial encounter   3. Primary immune deficiency disorder ([place])   4. Immunodeficiency ([place])   5. Allergic rhinitis due to pollen   6. Essential hypertension   7. Gastroesophageal reflux disease without esophagitis   8. Status post below knee amputation of right lower extremity ([place])   9. Insect sting allergy, current reaction, accidental or unintentional,  subsequent encounter   9.      Diabetes well controlled  Meds ordered this encounter  Medications  . albuterol (PROVENTIL) (2.5 MG/3ML) 0.083% nebulizer solution    Sig: Take 3 mLs (2.5 mg total) by nebulization every 4 (four) hours as needed for wheezing or shortness of breath.    Dispense:  75 mL    Refill:  2  . mometasone-formoterol (DULERA) 200-5 MCG/ACT AERO    Sig: Inhale 2 puffs into the lungs 2 (two) times daily.    Dispense:  1 Inhaler    Refill:  5    ON HOLD, PATIENT WILL CALL.    Patient Instructions  Environmental control of dust and mold Dulera 200- 2 puffs every 12 hours instead of Symbicort 160 Montelukast  10 mg once a day for coughing or wheezing Claritin 10 mg once a day for runny nose or itching Fluticasone 2 sprays per nostril once a day for stuffy nose Pro-air 2 puffs every 4 hours if needed for wheezing or coughing spells or instead albuterol 0.083% one unit dose every 4 hours if needed Continue on your other medications  Until you  get the results of your lab work, avoid fish, tree nuts including pecans and walnuts, shellfish  including  lobster. If you have an allergic reaction take Benadryl 50 mg every 4 hours and if you have life-threatening symptoms inject with EpiPen 0.3 mg. Follow similar instructions for an insect sting    Return in about 4 weeks (around 06/12/2016).    Thank you for the opportunity to care for this patient.  Please do not hesitate to contact me with questions.  Penne Lash, M.D.  [facility] [address] [place], [place] [address] [phone]

## 2016-05-19 ENCOUNTER — Telehealth: Payer: Self-pay | Admitting: Allergy

## 2016-05-19 NOTE — Telephone Encounter (Signed)
PATIENT WAS SEEN IN THE OFFICE ON Friday AND PUT ON DULERA. PATIENT SAYS SHE HAS THRUSH. SHE IS RINSING MOUTH OUT. PLEASE ADVISE. PHONE # IS [phone]. [facility] ON [address] [place].

## 2016-05-19 NOTE — Telephone Encounter (Deleted)
PATIENT WAS SEEN IN OFFICE ON Friday. WAS PUNT ON DULERA HAS THRUSH IN MOUTH.

## 2016-05-19 NOTE — Telephone Encounter (Signed)
Call in nystatin oral suspension to use 4 mL 4 times a day, gargle and swallow. Give 60 ML with 2 refills

## 2016-05-20 ENCOUNTER — Other Ambulatory Visit: Payer: Self-pay | Admitting: Allergy

## 2016-05-20 ENCOUNTER — Ambulatory Visit: Payer: Self-pay | Admitting: Allergy and Immunology

## 2016-05-20 MED ORDER — NYSTATIN 100000 UNIT/ML MT SUSP: OROMUCOSAL | 2 refills | Status: DC

## 2016-05-20 NOTE — Telephone Encounter (Signed)
Called in rx and informed patient.

## 2016-05-21 ENCOUNTER — Telehealth: Payer: Self-pay | Admitting: *Deleted

## 2016-05-21 ENCOUNTER — Ambulatory Visit: Payer: Medicare HMO | Admitting: Physical Therapy

## 2016-05-21 LAB — CBC WITH DIFFERENTIAL/PLATELET
Basophils Absolute: 0 10*3/uL (ref 0.0–0.2)
Basos: 0 %
EOS (ABSOLUTE): 0.1 10*3/uL (ref 0.0–0.4)
Eos: 2 %
Hematocrit: 40.3 % (ref 34.0–46.6)
Hemoglobin: 13.5 g/dL (ref 11.1–15.9)
Immature Grans (Abs): 0 10*3/uL (ref 0.0–0.1)
Immature Granulocytes: 0 %
Lymphocytes Absolute: 2.2 10*3/uL (ref 0.7–3.1)
Lymphs: 25 %
MCH: 31 pg (ref 26.6–33.0)
MCHC: 33.5 g/dL (ref 31.5–35.7)
MCV: 93 fL (ref 79–97)
Monocytes Absolute: 0.7 10*3/uL (ref 0.1–0.9)
Monocytes: 8 %
Neutrophils Absolute: 5.7 10*3/uL (ref 1.4–7.0)
Neutrophils: 65 %
Platelets: 308 10*3/uL (ref 150–379)
RBC: 4.35 x10E6/uL (ref 3.77–5.28)
RDW: 14.4 % (ref 12.3–15.4)
WBC: 8.7 10*3/uL (ref 3.4–10.8)

## 2016-05-21 LAB — ALLERGEN STINGING INSECT PANEL
Honeybee IgE: <0.1 kU/L
Hornet, White Face, IgE: <0.1 kU/L
Hornet, Yellow, IgE: <0.1 kU/L
Paper Wasp IgE: <0.1 kU/L
Yellow Jacket, IgE: <0.1 kU/L

## 2016-05-21 LAB — ALLERGEN PROFILE, SHELLFISH
Clam IgE: <0.1 kU/L
F023-IgE Crab: <0.1 kU/L
F080-IgE Lobster: <0.1 kU/L
F290-IgE Oyster: <0.1 kU/L
Scallop IgE: <0.1 kU/L
Shrimp IgE: 0.25 kU/L — AB

## 2016-05-21 LAB — ALLERGENS(7)
Brazil Nut IgE: <0.1 kU/L
F020-IgE Almond: <0.1 kU/L
F202-IgE Cashew Nut: <0.1 kU/L
Hazelnut (Filbert) IgE: <0.1 kU/L
Peanut IgE: <0.1 kU/L
Pecan Nut IgE: <0.1 kU/L
Walnut IgE: <0.1 kU/L

## 2016-05-21 LAB — ASPERGILLUS ANTIBODY BY IMMUNODIFF
Aspergillus flavus: NEGATIVE
Aspergillus fumigatus, IgG: NEGATIVE
Aspergillus niger: NEGATIVE

## 2016-05-21 LAB — PAN-ANCA
ANCA Proteinase 3: <3.5 U/mL (ref 0.0–3.5)
Atypical pANCA: <1:20 {titer}
C-ANCA: <1:20 {titer}
Myeloperoxidase Ab: <9 U/mL (ref 0.0–9.0)
P-ANCA: <1:20 {titer}

## 2016-05-21 LAB — ALLERGEN PROFILE, FOOD-FISH
Allergen Mackerel IgE: <0.1 kU/L
Allergen Salmon IgE: <0.1 kU/L
Allergen Trout IgE: <0.1 kU/L
Allergen Walley Pike IgE: <0.1 kU/L
Codfish IgE: <0.1 kU/L
Halibut IgE: <0.1 kU/L
Tuna: <0.1 kU/L

## 2016-05-21 LAB — IMMUNOGLOBULINS A/E/G/M, SERUM
IgA/Immunoglobulin A, Serum: 130 mg/dL (ref 87–352)
IgE (Immunoglobulin E), Serum: 28 IU/mL (ref 0–100)
IgG (Immunoglobin G), Serum: 645 mg/dL — ABNORMAL LOW (ref 700–1600)
IgM (Immunoglobulin M), Srm: 78 mg/dL (ref 26–217)

## 2016-05-21 LAB — ALPHA-1 ANTITRYPSIN PHENOTYPE: A-1 Antitrypsin: 157 mg/dL (ref 90–200)

## 2016-05-21 LAB — ALLERGEN A FUMIGATUS IGG: Aspergillus fumigatus IgG: 22.6 ug/mL — ABNORMAL HIGH (ref 0.0–1.9)

## 2016-05-21 NOTE — Telephone Encounter (Signed)
Patient calling for results on lab work that was done 05/15/16.

## 2016-05-22 ENCOUNTER — Telehealth: Payer: Self-pay | Admitting: Pediatrics

## 2016-05-22 MED ORDER — PREDNISONE 10 MG PO TABS: ORAL_TABLET | ORAL | 0 refills | Status: DC

## 2016-05-22 NOTE — Telephone Encounter (Signed)
Prednisone sent to pharmacy

## 2016-05-22 NOTE — Addendum Note (Signed)
Addended byOralia Rud M on: 05/22/2016 12:33 PM   Modules accepted: Orders

## 2016-05-22 NOTE — Addendum Note (Signed)
Addended by: Katherina Right D on: 05/22/2016 09:12 AM   Modules accepted: Orders

## 2016-05-23 LAB — SPECIMEN STATUS REPORT

## 2016-05-23 LAB — M003-IGE ASPERGILLUS FUMIGATUS: Aspergillus Fumigatus IgE: <0.1 kU/L

## 2016-05-25 NOTE — Telephone Encounter (Signed)
CALLED IN 120 ML NYSTATIN TO [facility] [address] [place], [place].  PT NOTIFIED.

## 2016-05-25 NOTE — Telephone Encounter (Signed)
Please advise 

## 2016-06-02 ENCOUNTER — Encounter: Payer: Self-pay | Admitting: Pediatrics

## 2016-06-02 ENCOUNTER — Ambulatory Visit (INDEPENDENT_AMBULATORY_CARE_PROVIDER_SITE_OTHER): Payer: Medicare HMO | Admitting: Pediatrics

## 2016-06-02 VITALS — BP 124/72 | HR 106 | Temp 99.2°F | Resp 20

## 2016-06-02 DIAGNOSIS — J301 Allergic rhinitis due to pollen: Secondary | ICD-10-CM

## 2016-06-02 DIAGNOSIS — J4550 Severe persistent asthma, uncomplicated: Secondary | ICD-10-CM

## 2016-06-02 DIAGNOSIS — Z91018 Allergy to other foods: Secondary | ICD-10-CM | POA: Diagnosis not present

## 2016-06-02 DIAGNOSIS — Z91038 Other insect allergy status: Secondary | ICD-10-CM

## 2016-06-02 DIAGNOSIS — B370 Candidal stomatitis: Secondary | ICD-10-CM | POA: Diagnosis not present

## 2016-06-02 MED ORDER — FLUCONAZOLE 150 MG PO TABS: ORAL_TABLET | ORAL | 1 refills | Status: DC

## 2016-06-02 NOTE — Patient Instructions (Addendum)
Continue on your current medications Call me if you are not doing well on this treatment plan Make sure your rinse gargle and spit out after use of Dulera Diflucan 150 mg tablets-take 1 tablet today and repeat once 3 days later for thrush Continue avoiding shellfish. We could arrange for a gradual oral challenge to fish, peanut and tree nuts in the future, if you  would like to see if you can eat these foods Continue having Benadryl and EpiPen available in case of an allergic reaction

## 2016-06-02 NOTE — Progress Notes (Addendum)
[place] [id number] Dept: [phone]  FOLLOW UP NOTE  Patient ID: Joanna [name], female    DOB: [birth year]  Age: 50 y.o. MRN: [id number] Date of Office Visit: 06/02/2016  Assessment  Chief Complaint: Allergies (not coughing as much, doing better)  HPI Joanna [name] presents for follow-up of asthma, allergic rhinitis and food allergies. Her lab work did return and was reviewed and inserted into the results note She definitely does not have allergic bronchopulmonary aspergillosis since she has a negative skin test to aspergillus  and a negative serum IgE to Aspergillus.. She did have a serum IgG to aspergillus which indicates history of exposure. Her asthma is much improved. She has a slight cough. Her voice is coming back. Her thrush is much improved.  Current medications Dulera 200-2 puffs every 12 hours, montelukast 10 mg once a day, Claritin 10 mg once a day, fluticasone 2 sprays per nostril once a day, Pro-air 2 puffs every 4 hours if needed or instead albuterol 0.083% one unit dose every 4 hours if needed, Benadryl and EpiPen 0.3 mg if needed. Her other medications are outlined in the chart.   Drug Allergies:  Allergies  Allergen Reactions  . Bee Venom   . Penicillins Swelling  . Sulfa Antibiotics Swelling    Physical Exam: BP 124/72   Pulse (!) 106   Temp 99.2 F (37.3 C) (Oral)   Resp 20   SpO2 93%    Physical Exam  Constitutional: She is oriented to person, place, and time. She appears well-developed and well-nourished.  HENT:  Eyes normal. Ears normal. Nose mild swelling of nasal turbinates. Pharynx normal except for a few erythematous areas in the soft palate  Neck: Neck supple.  Cardiovascular:  S1 and S2 normal no murmurs  Pulmonary/Chest:  Clear to percussion and auscultation  Lymphadenopathy:    She has no cervical adenopathy.  Neurological: She is alert and oriented to person, place, and time.  Psychiatric: She has a normal mood and  affect. Her behavior is normal. Judgment and thought content normal.  Vitals reviewed.   Diagnostics:  FVC 2.49 L FEV1 2.09 L. Predicted FVC 3.28 L predicted FEV1 2.47 L-this shows a minimal reduction in the forced vital capacity  Assessment and Plan: 1. Severe persistent asthma, uncomplicated   2. Allergic rhinitis due to pollen   3. History of food allergy   4. History of insect sting allergy   5. Thrush   6.     Diabetes well controlled  Meds ordered this encounter  Medications  . fluconazole (DIFLUCAN) 150 MG tablet    Sig: Take 1 tablet today and repeat once 3 days later for thrush    Dispense:  2 tablet    Refill:  1    Patient Instructions  Continue on your current medications Call me if you are not doing well on this treatment plan Make sure your rinse gargle and spit out after use of Dulera Diflucan 150 mg tablets-take 1 tablet today and repeat once 3 days later for thrush Continue avoiding shellfish. We could arrange for a gradual oral challenge to fish, peanut and tree nuts in the future, if you  would like to see if you can eat these foods Continue having Benadryl and EpiPen available in case of an allergic reaction   Return in about 3 months (around 09/02/2016).    Thank you for the opportunity to care for this patient.  Please do not hesitate to contact me with  questions.  Penne Lash, M.D.  [facility] [address] [place], [place] [address] [phone]

## 2016-06-03 DIAGNOSIS — B370 Candidal stomatitis: Secondary | ICD-10-CM | POA: Insufficient documentation

## 2016-06-03 DIAGNOSIS — Z91038 Other insect allergy status: Secondary | ICD-10-CM | POA: Insufficient documentation

## 2016-06-22 ENCOUNTER — Telehealth: Payer: Self-pay | Admitting: *Deleted

## 2016-06-22 MED ORDER — MONTELUKAST SODIUM 10 MG PO TABS: 10.0000 mg | ORAL_TABLET | Freq: Every day | ORAL | 5 refills | Status: DC

## 2016-06-22 NOTE — Telephone Encounter (Signed)
Montelukast sent

## 2016-06-22 NOTE — Telephone Encounter (Signed)
Pt needs refill for montelukast 10 mg sent to [facility] on [address] in [place].

## 2016-06-24 ENCOUNTER — Telehealth: Payer: Self-pay | Admitting: Allergy

## 2016-06-24 ENCOUNTER — Other Ambulatory Visit: Payer: Self-pay | Admitting: Allergy

## 2016-06-24 MED ORDER — CLOTRIMAZOLE 10 MG MT TROC: OROMUCOSAL | 0 refills | Status: DC

## 2016-06-24 NOTE — Telephone Encounter (Signed)
Please call in spacer. In addition, please call in prescription for clotrimazole 10 mg troches: Slowly dissolve in the mouth 5 times per day for 10 days, NOT to be chewed or swallowed whole. Thanks.

## 2016-06-24 NOTE — Telephone Encounter (Signed)
PATIENT CALLED SAID SHE HAD THRUSH FROM USING DULERA. IS RINSING MOUTH OUT. CAN WE CALL HER IN SOMETHING? IS IT OK FOR [place] TO GIVE A SPACER TO USE? PLEASE ADVISE.

## 2016-06-24 NOTE — Telephone Encounter (Signed)
CALLED PATIENT AND INFORMED HER WE WERE CALLING HER IN CLOTRIMAZOLE 10MG . AND ALSO SHE IS COMING BY FOR A SPACER AND SHOWED HOW TO USE.

## 2016-07-20 ENCOUNTER — Telehealth: Payer: Self-pay | Admitting: Allergy

## 2016-07-20 NOTE — Telephone Encounter (Signed)
Patient called and said she is still having thrush in mouth. Had been on diflucan, mouth wash , clotrimazole 10mg .Every time she comes off the meds the thrush comes back. Is using spacer and rinsing mouth after use.Patient  said blood sugar is under control. Patient thinks it is coming from the inhalers. White all over tongue and hoarse. Patient is also use spacer.Patient wondering if any thing else to do.

## 2016-07-21 ENCOUNTER — Other Ambulatory Visit: Payer: Self-pay | Admitting: Allergy

## 2016-07-21 MED ORDER — FLUCONAZOLE 150 MG PO TABS: ORAL_TABLET | ORAL | 1 refills | Status: DC

## 2016-07-21 NOTE — Telephone Encounter (Signed)
If her asthma is well controlled, we can decrease Dulera 100 to one puff every 12 hours. None of her other medicines would give her thrush. We can refill Diflucan 150 mg tablet to take 1 tablet today. She may repeat this dose in 3 days See me in 4-6 weeks to make sure that her asthma and lung function have not deteriorated

## 2016-07-21 NOTE — Telephone Encounter (Signed)
INFORMED PATIENT AND CALLED IN DIFLUCAN 150MG .

## 2016-07-28 ENCOUNTER — Ambulatory Visit: Payer: Medicare HMO | Admitting: Allergy and Immunology

## 2016-08-05 ENCOUNTER — Ambulatory Visit (INDEPENDENT_AMBULATORY_CARE_PROVIDER_SITE_OTHER): Payer: Medicare HMO | Admitting: Allergy & Immunology

## 2016-08-05 ENCOUNTER — Encounter: Payer: Self-pay | Admitting: Allergy & Immunology

## 2016-08-05 VITALS — BP 126/82 | HR 80 | Temp 98.7°F | Resp 16 | Ht 64.0 in

## 2016-08-05 DIAGNOSIS — J301 Allergic rhinitis due to pollen: Secondary | ICD-10-CM

## 2016-08-05 DIAGNOSIS — J4550 Severe persistent asthma, uncomplicated: Secondary | ICD-10-CM

## 2016-08-05 MED ORDER — TIOTROPIUM BROMIDE MONOHYDRATE 1.25 MCG/ACT IN AERS: 1.0000 | INHALATION_SPRAY | Freq: Every day | RESPIRATORY_TRACT | 5 refills | Status: DC

## 2016-08-05 MED ORDER — CLOTRIMAZOLE 10 MG MT TROC: 10.0000 mg | Freq: Every day | OROMUCOSAL | 1 refills | Status: AC
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
## 2016-08-05 NOTE — Patient Instructions (Addendum)
1. Severe persistent asthma, uncomplicated - Start Spriva one puff once daily. - Labs today: CBC with differential, IgE level - Steroids provided today in case her symptoms worsen.   2. Allergic rhinitis due to pollen - Continue with Flonase and Claritin. - We will start allergy shots.   3. Return in about 4 weeks (around 09/02/2016).  Please inform us of any Emergency Department visits, hospitalizations, or changes in symptoms. Call us before going to the ED for breathing or allergy symptoms since we might be able to fit you in for a sick visit. Feel free to contact us anytime with any questions, problems, or concerns.  It was a pleasure to meet you today!   Websites that have reliable patient information: 1. American Academy of Asthma, Allergy, and Immunology: www.aaaai.org 2. Food Allergy Research and Education (FARE): foodallergy.org 3. Mothers of Asthmatics: http://www.asthmacommunitynetwork.org 4. American College of Allergy, Asthma, and Immunology: www.acaai.org

## 2016-08-06 ENCOUNTER — Ambulatory Visit (INDEPENDENT_AMBULATORY_CARE_PROVIDER_SITE_OTHER): Payer: Medicare HMO | Admitting: Orthopedic Surgery

## 2016-08-06 DIAGNOSIS — M19012 Primary osteoarthritis, left shoulder: Secondary | ICD-10-CM

## 2016-08-06 DIAGNOSIS — M1712 Unilateral primary osteoarthritis, left knee: Secondary | ICD-10-CM

## 2016-08-06 LAB — CBC WITH DIFFERENTIAL
Basophils Absolute: 0 10*3/uL (ref 0.0–0.2)
Basos: 0 %
EOS (ABSOLUTE): 0.2 10*3/uL (ref 0.0–0.4)
Eos: 3 %
Hematocrit: 38.9 % (ref 34.0–46.6)
Hemoglobin: 12.9 g/dL (ref 11.1–15.9)
Immature Grans (Abs): 0 10*3/uL (ref 0.0–0.1)
Immature Granulocytes: 0 %
Lymphocytes Absolute: 1.7 10*3/uL (ref 0.7–3.1)
Lymphs: 27 %
MCH: 30.5 pg (ref 26.6–33.0)
MCHC: 33.2 g/dL (ref 31.5–35.7)
MCV: 92 fL (ref 79–97)
Monocytes Absolute: 0.4 10*3/uL (ref 0.1–0.9)
Monocytes: 6 %
Neutrophils Absolute: 4.2 10*3/uL (ref 1.4–7.0)
Neutrophils: 64 %
RBC: 4.23 x10E6/uL (ref 3.77–5.28)
RDW: 14.8 % (ref 12.3–15.4)
WBC: 6.4 10*3/uL (ref 3.4–10.8)

## 2016-08-06 LAB — IGE: IgE (Immunoglobulin E), Serum: 21 IU/mL (ref 0–100)

## 2016-08-10 ENCOUNTER — Ambulatory Visit: Payer: Medicare HMO | Admitting: Pediatrics

## 2016-08-10 NOTE — Progress Notes (Unsigned)
Vials to be made 08-10-16.  Jm

## 2016-08-11 DIAGNOSIS — J3089 Other allergic rhinitis: Secondary | ICD-10-CM | POA: Diagnosis not present

## 2016-08-12 DIAGNOSIS — J3089 Other allergic rhinitis: Secondary | ICD-10-CM | POA: Diagnosis not present

## 2016-08-19 ENCOUNTER — Other Ambulatory Visit: Payer: Self-pay

## 2016-08-19 ENCOUNTER — Ambulatory Visit (INDEPENDENT_AMBULATORY_CARE_PROVIDER_SITE_OTHER): Payer: Medicare HMO

## 2016-08-19 DIAGNOSIS — J309 Allergic rhinitis, unspecified: Secondary | ICD-10-CM | POA: Diagnosis not present

## 2016-08-19 MED ORDER — EPINEPHRINE 0.3 MG/0.3ML IJ SOAJ: 0.3000 mg | Freq: Once | INTRAMUSCULAR | 1 refills | Status: AC

## 2016-08-19 NOTE — Progress Notes (Signed)
Immunotherapy   Patient Details  Name: Joanna [name] MRN: [id number] Date of Birth: Aug 23, 1966  08/19/2016  Thersa Salt  Pt started injections Following schedule: B  Frequency:1-2 X PER WEEK Epi-Pen:YES Consent signed and patient instructions given.   [name] [name] 08/19/2016, 11:11 AM

## 2016-08-20 ENCOUNTER — Ambulatory Visit: Payer: Medicare HMO | Attending: Orthopedic Surgery | Admitting: Physical Therapy

## 2016-08-20 DIAGNOSIS — R262 Difficulty in walking, not elsewhere classified: Secondary | ICD-10-CM | POA: Diagnosis present

## 2016-08-20 DIAGNOSIS — R2689 Other abnormalities of gait and mobility: Secondary | ICD-10-CM | POA: Diagnosis present

## 2016-08-20 DIAGNOSIS — M6281 Muscle weakness (generalized): Secondary | ICD-10-CM | POA: Diagnosis present

## 2016-08-20 DIAGNOSIS — M79605 Pain in left leg: Secondary | ICD-10-CM | POA: Insufficient documentation
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
## 2016-08-24 ENCOUNTER — Telehealth (INDEPENDENT_AMBULATORY_CARE_PROVIDER_SITE_OTHER): Payer: Self-pay | Admitting: Radiology

## 2016-08-24 ENCOUNTER — Ambulatory Visit (INDEPENDENT_AMBULATORY_CARE_PROVIDER_SITE_OTHER): Payer: Medicare HMO

## 2016-08-24 DIAGNOSIS — M25512 Pain in left shoulder: Secondary | ICD-10-CM

## 2016-08-24 DIAGNOSIS — M7542 Impingement syndrome of left shoulder: Secondary | ICD-10-CM

## 2016-08-24 DIAGNOSIS — J309 Allergic rhinitis, unspecified: Secondary | ICD-10-CM

## 2016-08-24 DIAGNOSIS — G8929 Other chronic pain: Secondary | ICD-10-CM

## 2016-08-24 NOTE — Telephone Encounter (Signed)
Patient called today leaving voicemail wanting to check the status of her pain management referral to [facility]. She has not heard anything back yet. I will call their office tomorrow morning to give patient an update, unfortunately these appointments are one typical three month time frame, including time for chart review.

## 2016-08-25 NOTE — Telephone Encounter (Signed)
I called to check on the status of referral today to [facility]. And patient was declined care there. After reviewing patients medical chart they told me this was simple medical management and they would not be able to offer her anything at their clinic. Patient is very frustrated because she has been waiting in August 22nd. He said he faxed [place] the denial letter but we never received. What would you like [place] to do?

## 2016-08-25 NOTE — Telephone Encounter (Signed)
Can we find another pain clinic that would accept her.

## 2016-08-26 ENCOUNTER — Telehealth (INDEPENDENT_AMBULATORY_CARE_PROVIDER_SITE_OTHER): Payer: Self-pay

## 2016-08-26 ENCOUNTER — Ambulatory Visit (INDEPENDENT_AMBULATORY_CARE_PROVIDER_SITE_OTHER): Payer: Medicare HMO | Admitting: *Deleted

## 2016-08-26 DIAGNOSIS — J4550 Severe persistent asthma, uncomplicated: Secondary | ICD-10-CM

## 2016-08-26 NOTE — Progress Notes (Signed)
Immunotherapy   Patient Details  Name: Derna Dittmer MRN: [id number] Date of Birth: 02-20-1966  08/26/2016  Thersa Salt started injections for  Nucala 100 mg  Frequency: Every 4 weeks  Epi-Pen:Epi-Pen Available  Consent signed and patient instructions given.  Nucala 100mg  given today in right arm.    Robie Ridge 08/26/2016, 11:12 AM

## 2016-08-26 NOTE — Telephone Encounter (Signed)
I made referral to [facility]. All information faxed to their office yesterday, patient has been made aware by [place].

## 2016-08-26 NOTE — Telephone Encounter (Signed)
Pt called and she states that she was given a number to pain management from our office and she called and they have advised that they did not have any paperwork on her. I verified the number with the patient  And it was for the [facility]. We just referred that pt to them yesterday as the [facility] clinic we referred the pt to declined to see the pt.  We sent that referral the end of August and we have sent and called several times and we were just informed yesterday that they were not going to see her. So we sent a referral to another pain management. I explained to the pt that these referrals take time and that most offices want to review the chart before making an appt. And we are trying to get her an appt but it does take some time to establish care. Voiced understanding and we will notify once this referral has been accepted.

## 2016-08-27 ENCOUNTER — Ambulatory Visit (INDEPENDENT_AMBULATORY_CARE_PROVIDER_SITE_OTHER): Payer: Medicare HMO

## 2016-08-27 DIAGNOSIS — J309 Allergic rhinitis, unspecified: Secondary | ICD-10-CM | POA: Diagnosis not present

## 2016-08-28 ENCOUNTER — Telehealth: Payer: Self-pay | Admitting: *Deleted

## 2016-08-28 MED ORDER — PREDNISONE 10 MG (21) PO TBPK: 60.0000 mg | ORAL_TABLET | Freq: Every day | ORAL | 0 refills | Status: AC

## 2016-08-28 NOTE — Telephone Encounter (Signed)
Reviewed note - I sent in prednisone 60mg  daily for five days. Has she started the Nucala? It has been approved by her insurance.  Thanks, Salvatore Marvel, MD [facility] [place]

## 2016-08-28 NOTE — Telephone Encounter (Signed)
Pt is wanting some prednisone called into [facility] on [address] for coughing. Would like a return call to discuss allergy injections

## 2016-08-28 NOTE — Telephone Encounter (Signed)
Spoke with patient she has been coughing x 4 days and would like to a have prednisone sent to pharmacy. If so, she wanted to make sure it was okay to take the prednisone with her allergy and xolair injections? Please advise.

## 2016-08-31 ENCOUNTER — Ambulatory Visit (INDEPENDENT_AMBULATORY_CARE_PROVIDER_SITE_OTHER): Payer: Medicare HMO

## 2016-08-31 ENCOUNTER — Ambulatory Visit: Payer: Medicare HMO | Attending: Orthopedic Surgery

## 2016-08-31 DIAGNOSIS — R262 Difficulty in walking, not elsewhere classified: Secondary | ICD-10-CM | POA: Diagnosis present

## 2016-08-31 DIAGNOSIS — J309 Allergic rhinitis, unspecified: Secondary | ICD-10-CM | POA: Diagnosis not present

## 2016-08-31 DIAGNOSIS — M6281 Muscle weakness (generalized): Secondary | ICD-10-CM | POA: Insufficient documentation

## 2016-08-31 DIAGNOSIS — M79605 Pain in left leg: Secondary | ICD-10-CM | POA: Insufficient documentation

## 2016-08-31 DIAGNOSIS — R2689 Other abnormalities of gait and mobility: Secondary | ICD-10-CM | POA: Diagnosis present
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
## 2016-08-31 NOTE — Telephone Encounter (Signed)
No need to delay allergy injections or Nucala. Thanks, Logan!   Salvatore Marvel, MD [facility] [place]

## 2016-08-31 NOTE — Telephone Encounter (Signed)
Yes she started nucala on 11/1 with no problems

## 2016-08-31 NOTE — Telephone Encounter (Signed)
Should she wait until she is finished with her prednisone to get either allergy injections or nucala?

## 2016-09-02 NOTE — Telephone Encounter (Signed)
PT. Called stating she wanted hydrocolloid dressings for her amputee 4x4. Pt. Requesting call back [phone]

## 2016-09-03 ENCOUNTER — Ambulatory Visit (INDEPENDENT_AMBULATORY_CARE_PROVIDER_SITE_OTHER): Payer: Medicare HMO | Admitting: *Deleted

## 2016-09-03 ENCOUNTER — Ambulatory Visit: Payer: Medicare HMO | Admitting: Physical Therapy

## 2016-09-03 DIAGNOSIS — R2689 Other abnormalities of gait and mobility: Secondary | ICD-10-CM

## 2016-09-03 DIAGNOSIS — R262 Difficulty in walking, not elsewhere classified: Secondary | ICD-10-CM

## 2016-09-03 DIAGNOSIS — J309 Allergic rhinitis, unspecified: Secondary | ICD-10-CM

## 2016-09-03 DIAGNOSIS — M79605 Pain in left leg: Secondary | ICD-10-CM | POA: Diagnosis not present

## 2016-09-03 DIAGNOSIS — M6281 Muscle weakness (generalized): Secondary | ICD-10-CM
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
## 2016-09-03 NOTE — Patient Instructions (Signed)

## 2016-09-04 NOTE — Telephone Encounter (Signed)
Pt Called back about message below  

## 2016-09-07 ENCOUNTER — Ambulatory Visit (INDEPENDENT_AMBULATORY_CARE_PROVIDER_SITE_OTHER): Payer: Medicare HMO

## 2016-09-07 ENCOUNTER — Ambulatory Visit: Payer: Medicare HMO | Admitting: Physical Therapy

## 2016-09-07 DIAGNOSIS — M79605 Pain in left leg: Secondary | ICD-10-CM | POA: Diagnosis not present

## 2016-09-07 DIAGNOSIS — R2689 Other abnormalities of gait and mobility: Secondary | ICD-10-CM

## 2016-09-07 DIAGNOSIS — R262 Difficulty in walking, not elsewhere classified: Secondary | ICD-10-CM

## 2016-09-07 DIAGNOSIS — M6281 Muscle weakness (generalized): Secondary | ICD-10-CM

## 2016-09-07 DIAGNOSIS — J309 Allergic rhinitis, unspecified: Secondary | ICD-10-CM | POA: Diagnosis not present
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
## 2016-09-08 ENCOUNTER — Ambulatory Visit: Payer: Medicare HMO | Admitting: Pediatrics

## 2016-09-09 ENCOUNTER — Ambulatory Visit: Payer: Medicare HMO | Admitting: Physical Therapy

## 2016-09-09 DIAGNOSIS — M79605 Pain in left leg: Secondary | ICD-10-CM

## 2016-09-09 DIAGNOSIS — R2689 Other abnormalities of gait and mobility: Secondary | ICD-10-CM

## 2016-09-09 DIAGNOSIS — M6281 Muscle weakness (generalized): Secondary | ICD-10-CM

## 2016-09-09 DIAGNOSIS — R262 Difficulty in walking, not elsewhere classified: Secondary | ICD-10-CM

## 2016-09-09 NOTE — Telephone Encounter (Signed)
I called patient to advise that I apologized after speaking with her yesterday I did not return her call. I was scheduled to work in another department and was unable to return her call. Yesterday she asked that I call her pharmacy and call in prescription for hydrocolloid dressing. I spoke with the pharmacist yesterday and she states they don't accept wound care dressing orders this would need to go to a home health agency. I called multiple home health agencies and they will not cover for wound care supplies unless they are providing patient with wound care. I was transferred to [facility] store office. There was no answer I had to leave a voicemail and have heard nothing back if they supply this. I called left patient a detailed voicemail explaining all this. Advised that I would leave a prescription for dressing at the front desk for her whenever she is ready she can pick this up. She also has been scheduled for pain management appointment at 09/16/16 for [facility]. She is aware of appointment date and time.
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
## 2016-09-10 ENCOUNTER — Ambulatory Visit: Payer: Medicare HMO | Admitting: Physical Therapy

## 2016-09-10 ENCOUNTER — Ambulatory Visit (INDEPENDENT_AMBULATORY_CARE_PROVIDER_SITE_OTHER): Payer: Medicare HMO

## 2016-09-10 DIAGNOSIS — J309 Allergic rhinitis, unspecified: Secondary | ICD-10-CM

## 2016-09-11 ENCOUNTER — Encounter: Payer: Self-pay | Admitting: Allergy & Immunology

## 2016-09-11 ENCOUNTER — Ambulatory Visit (INDEPENDENT_AMBULATORY_CARE_PROVIDER_SITE_OTHER): Payer: Medicare HMO | Admitting: Allergy & Immunology

## 2016-09-11 VITALS — BP 100/80 | HR 88 | Temp 99.3°F | Resp 16

## 2016-09-11 DIAGNOSIS — J4550 Severe persistent asthma, uncomplicated: Secondary | ICD-10-CM | POA: Diagnosis not present

## 2016-09-11 DIAGNOSIS — K219 Gastro-esophageal reflux disease without esophagitis: Secondary | ICD-10-CM

## 2016-09-11 DIAGNOSIS — J3089 Other allergic rhinitis: Secondary | ICD-10-CM

## 2016-09-11 LAB — PULMONARY FUNCTION TEST

## 2016-09-11 MED ORDER — ALBUTEROL SULFATE 108 (90 BASE) MCG/ACT IN AEPB: 2.0000 | INHALATION_SPRAY | RESPIRATORY_TRACT | 1 refills | Status: DC

## 2016-09-11 MED ORDER — MOMETASONE FUROATE 110 MCG/INH IN AEPB: INHALATION_SPRAY | RESPIRATORY_TRACT | 5 refills | Status: DC

## 2016-09-11 MED ORDER — DEXLANSOPRAZOLE 30 MG PO CPDR: DELAYED_RELEASE_CAPSULE | ORAL | 0 refills | Status: DC
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
## 2016-09-11 NOTE — Patient Instructions (Addendum)
1. Severe persistent asthma, uncomplicated - Continue with Spriva but increase to one puff twice daily. - Continue with Nucala.  - Change to ProAir RespiClick 4 puffs every 4-6 hours as needed for coughing/wheezing/shortness of breath. - Add Asthmanex 152mcg two inhalations twice daily for two weeks when you have increased respiratory symptoms. - Inhaled steroids should have fewer side effects than systemic steroids such as prednisone.   2. Allergic rhinitis due to pollen - Continue with Flonase and Claritin. - We will start allergy shots.    3. GERD  - Stop your other other reflux medications and start Dexilant 30mg  twice daily. - Hopefully this will work better. - Call us if you think it works better and we can send to [facility].  4. Return in about 3 months (around 12/12/2016).  Please inform us of any Emergency Department visits, hospitalizations, or changes in symptoms. Call us before going to the ED for breathing or allergy symptoms since we might be able to fit you in for a sick visit. Feel free to contact us anytime with any questions, problems, or concerns.  It was a pleasure to see you again today! Happy holidays!   Websites that have reliable patient information: 1. American Academy of Asthma, Allergy, and Immunology: www.aaaai.org 2. Food Allergy Research and Education (FARE): foodallergy.org 3. Mothers of Asthmatics: http://www.asthmacommunitynetwork.org 4. American College of Allergy, Asthma, and Immunology: www.acaai.org

## 2016-09-14 ENCOUNTER — Ambulatory Visit: Payer: Medicare HMO

## 2016-09-14 ENCOUNTER — Ambulatory Visit (INDEPENDENT_AMBULATORY_CARE_PROVIDER_SITE_OTHER): Payer: Medicare HMO

## 2016-09-14 DIAGNOSIS — J309 Allergic rhinitis, unspecified: Secondary | ICD-10-CM

## 2016-09-14 DIAGNOSIS — M79605 Pain in left leg: Secondary | ICD-10-CM

## 2016-09-14 DIAGNOSIS — M6281 Muscle weakness (generalized): Secondary | ICD-10-CM

## 2016-09-14 DIAGNOSIS — R2689 Other abnormalities of gait and mobility: Secondary | ICD-10-CM

## 2016-09-14 DIAGNOSIS — R262 Difficulty in walking, not elsewhere classified: Secondary | ICD-10-CM
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
## 2016-09-16 ENCOUNTER — Ambulatory Visit: Payer: Medicare HMO

## 2016-09-21 ENCOUNTER — Telehealth: Payer: Self-pay | Admitting: Allergy

## 2016-09-21 ENCOUNTER — Ambulatory Visit (INDEPENDENT_AMBULATORY_CARE_PROVIDER_SITE_OTHER): Payer: Medicare HMO

## 2016-09-21 ENCOUNTER — Ambulatory Visit: Payer: Medicare HMO

## 2016-09-21 DIAGNOSIS — J309 Allergic rhinitis, unspecified: Secondary | ICD-10-CM

## 2016-09-21 NOTE — Telephone Encounter (Signed)
Informed patient Dexilant 30mg  has been approved. Case id [id number]

## 2016-09-23 ENCOUNTER — Ambulatory Visit (INDEPENDENT_AMBULATORY_CARE_PROVIDER_SITE_OTHER): Payer: Medicare HMO | Admitting: *Deleted

## 2016-09-23 DIAGNOSIS — J4550 Severe persistent asthma, uncomplicated: Secondary | ICD-10-CM | POA: Diagnosis not present

## 2016-09-24 ENCOUNTER — Ambulatory Visit: Payer: Medicare HMO | Admitting: Rehabilitative and Restorative Service Providers"

## 2016-09-24 ENCOUNTER — Ambulatory Visit (INDEPENDENT_AMBULATORY_CARE_PROVIDER_SITE_OTHER): Payer: Medicare HMO

## 2016-09-24 DIAGNOSIS — J309 Allergic rhinitis, unspecified: Secondary | ICD-10-CM

## 2016-09-28 ENCOUNTER — Ambulatory Visit (INDEPENDENT_AMBULATORY_CARE_PROVIDER_SITE_OTHER): Payer: Medicare HMO | Admitting: *Deleted

## 2016-09-28 DIAGNOSIS — J309 Allergic rhinitis, unspecified: Secondary | ICD-10-CM | POA: Diagnosis not present

## 2016-10-05 ENCOUNTER — Ambulatory Visit (INDEPENDENT_AMBULATORY_CARE_PROVIDER_SITE_OTHER): Payer: Medicare HMO

## 2016-10-05 ENCOUNTER — Other Ambulatory Visit: Payer: Self-pay | Admitting: Allergy

## 2016-10-05 DIAGNOSIS — J309 Allergic rhinitis, unspecified: Secondary | ICD-10-CM

## 2016-10-05 MED ORDER — DEXLANSOPRAZOLE 30 MG PO CPDR: DELAYED_RELEASE_CAPSULE | ORAL | 1 refills | Status: DC

## 2016-10-08 ENCOUNTER — Ambulatory Visit (INDEPENDENT_AMBULATORY_CARE_PROVIDER_SITE_OTHER): Payer: Medicare HMO

## 2016-10-08 DIAGNOSIS — J309 Allergic rhinitis, unspecified: Secondary | ICD-10-CM

## 2016-10-12 ENCOUNTER — Ambulatory Visit (INDEPENDENT_AMBULATORY_CARE_PROVIDER_SITE_OTHER): Payer: Medicare HMO

## 2016-10-12 DIAGNOSIS — J309 Allergic rhinitis, unspecified: Secondary | ICD-10-CM | POA: Diagnosis not present

## 2016-10-15 ENCOUNTER — Ambulatory Visit (INDEPENDENT_AMBULATORY_CARE_PROVIDER_SITE_OTHER): Payer: Medicare HMO

## 2016-10-15 DIAGNOSIS — J309 Allergic rhinitis, unspecified: Secondary | ICD-10-CM | POA: Diagnosis not present

## 2016-10-21 ENCOUNTER — Ambulatory Visit (INDEPENDENT_AMBULATORY_CARE_PROVIDER_SITE_OTHER): Payer: Medicare HMO | Admitting: *Deleted

## 2016-10-21 ENCOUNTER — Ambulatory Visit: Payer: Medicare HMO

## 2016-10-21 DIAGNOSIS — J309 Allergic rhinitis, unspecified: Secondary | ICD-10-CM

## 2016-10-21 DIAGNOSIS — J4550 Severe persistent asthma, uncomplicated: Secondary | ICD-10-CM | POA: Diagnosis not present

## 2016-11-02 ENCOUNTER — Ambulatory Visit (INDEPENDENT_AMBULATORY_CARE_PROVIDER_SITE_OTHER): Payer: Medicare HMO

## 2016-11-02 DIAGNOSIS — J309 Allergic rhinitis, unspecified: Secondary | ICD-10-CM

## 2016-11-05 ENCOUNTER — Ambulatory Visit (INDEPENDENT_AMBULATORY_CARE_PROVIDER_SITE_OTHER): Payer: Medicare HMO

## 2016-11-05 DIAGNOSIS — J309 Allergic rhinitis, unspecified: Secondary | ICD-10-CM | POA: Diagnosis not present

## 2016-11-06 ENCOUNTER — Ambulatory Visit (INDEPENDENT_AMBULATORY_CARE_PROVIDER_SITE_OTHER): Payer: Medicare HMO | Admitting: Orthopedic Surgery

## 2016-11-09 ENCOUNTER — Ambulatory Visit (INDEPENDENT_AMBULATORY_CARE_PROVIDER_SITE_OTHER): Payer: Medicare HMO | Admitting: Orthopedic Surgery

## 2016-11-09 ENCOUNTER — Ambulatory Visit (INDEPENDENT_AMBULATORY_CARE_PROVIDER_SITE_OTHER): Payer: Medicare HMO

## 2016-11-09 DIAGNOSIS — J309 Allergic rhinitis, unspecified: Secondary | ICD-10-CM | POA: Diagnosis not present

## 2016-11-10 ENCOUNTER — Other Ambulatory Visit: Payer: Self-pay | Admitting: *Deleted

## 2016-11-10 DIAGNOSIS — J4550 Severe persistent asthma, uncomplicated: Secondary | ICD-10-CM

## 2016-11-10 MED ORDER — TIOTROPIUM BROMIDE MONOHYDRATE 1.25 MCG/ACT IN AERS: 1.0000 | INHALATION_SPRAY | Freq: Every day | RESPIRATORY_TRACT | 0 refills | Status: DC

## 2016-11-16 ENCOUNTER — Ambulatory Visit (INDEPENDENT_AMBULATORY_CARE_PROVIDER_SITE_OTHER): Payer: Medicare HMO

## 2016-11-16 ENCOUNTER — Telehealth: Payer: Self-pay | Admitting: Allergy

## 2016-11-16 ENCOUNTER — Telehealth: Payer: Self-pay | Admitting: *Deleted

## 2016-11-16 DIAGNOSIS — J309 Allergic rhinitis, unspecified: Secondary | ICD-10-CM

## 2016-11-16 NOTE — Telephone Encounter (Signed)
Pt would like a return call

## 2016-11-16 NOTE — Telephone Encounter (Signed)
Insurance company called and said Sprivia  1.25 should be taken 2 puffs in morning. Dr. Shaune Leeks ok orders for 2 puffis in morning. Insurance would not pay for one puff twice a day. [place]. For insurance. Informed patient.

## 2016-11-16 NOTE — Telephone Encounter (Signed)
Will give to nurse with phone # & Invoice # for Rx question to [facility]

## 2016-11-16 NOTE — Telephone Encounter (Signed)
Gave info to linda C.

## 2016-11-17 ENCOUNTER — Telehealth: Payer: Self-pay | Admitting: Allergy

## 2016-11-17 ENCOUNTER — Encounter (INDEPENDENT_AMBULATORY_CARE_PROVIDER_SITE_OTHER): Payer: Self-pay | Admitting: Orthopedic Surgery

## 2016-11-17 ENCOUNTER — Other Ambulatory Visit: Payer: Self-pay | Admitting: Allergy

## 2016-11-17 ENCOUNTER — Other Ambulatory Visit: Payer: Self-pay | Admitting: Allergy & Immunology

## 2016-11-17 ENCOUNTER — Ambulatory Visit (INDEPENDENT_AMBULATORY_CARE_PROVIDER_SITE_OTHER): Payer: Medicare HMO | Admitting: Orthopedic Surgery

## 2016-11-17 VITALS — Ht 64.0 in | Wt 269.0 lb

## 2016-11-17 DIAGNOSIS — M7542 Impingement syndrome of left shoulder: Secondary | ICD-10-CM

## 2016-11-17 DIAGNOSIS — M7541 Impingement syndrome of right shoulder: Secondary | ICD-10-CM | POA: Diagnosis not present

## 2016-11-17 MED ORDER — METHYLPREDNISOLONE ACETATE 40 MG/ML IJ SUSP
40.0000 mg | INTRAMUSCULAR | Status: AC | PRN
  Administered 2016-11-17: 40 mg via INTRA_ARTICULAR

## 2016-11-17 MED ORDER — LIDOCAINE HCL 1 % IJ SOLN
5.0000 mL | INTRAMUSCULAR | Status: AC | PRN
  Administered 2016-11-17: 5 mL

## 2016-11-17 MED ORDER — MONTELUKAST SODIUM 10 MG PO TABS: 10.0000 mg | ORAL_TABLET | Freq: Every day | ORAL | 0 refills | Status: DC
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
## 2016-11-17 NOTE — Telephone Encounter (Signed)
NA

## 2016-11-17 NOTE — Telephone Encounter (Signed)
Pt. Called said she needed her singulair refilled. Refilled for 90 days and her  [facility]

## 2016-11-18 ENCOUNTER — Ambulatory Visit: Payer: Medicare HMO

## 2016-11-18 ENCOUNTER — Other Ambulatory Visit: Payer: Self-pay

## 2016-11-18 MED ORDER — ALBUTEROL SULFATE 108 (90 BASE) MCG/ACT IN AEPB: 2.0000 | INHALATION_SPRAY | RESPIRATORY_TRACT | 0 refills | Status: DC

## 2016-11-18 NOTE — Addendum Note (Signed)
Addended by: Katherina Right D on: 11/18/2016 11:29 AM   Modules accepted: Orders

## 2016-11-19 ENCOUNTER — Ambulatory Visit (INDEPENDENT_AMBULATORY_CARE_PROVIDER_SITE_OTHER): Payer: Medicare HMO

## 2016-11-19 DIAGNOSIS — J4550 Severe persistent asthma, uncomplicated: Secondary | ICD-10-CM

## 2016-11-19 MED ORDER — MEPOLIZUMAB 100 MG ~~LOC~~ SOLR
100.0000 mg | SUBCUTANEOUS | Status: DC
Start: 2016-11-19 — End: 2019-05-11
  Administered 2016-11-19 – 2017-05-13 (×2): 100 mg via SUBCUTANEOUS

## 2016-11-23 ENCOUNTER — Telehealth: Payer: Self-pay | Admitting: Allergy

## 2016-11-23 ENCOUNTER — Other Ambulatory Visit: Payer: Self-pay | Admitting: Allergy

## 2016-11-23 MED ORDER — CLOTRIMAZOLE 10 MG MT TROC: OROMUCOSAL | 0 refills | Status: DC

## 2016-11-23 NOTE — Telephone Encounter (Signed)
   A prescription has been provided for clotrimazole 10 mg troches: Slowly dissolve in the mouth 5 times per day for 10 days, not to be chewed or swallowed whole.  Please make sure that the patient is rinsing her mouth out carefully after using inhaled corticosteroids.

## 2016-11-23 NOTE — Telephone Encounter (Signed)
Called in prescription and informed patient.

## 2016-11-23 NOTE — Telephone Encounter (Signed)
na

## 2016-11-23 NOTE — Telephone Encounter (Signed)
Patient called said she had thrush in mouth again. Wanted to know if you could call in something in for her.

## 2016-11-25 ENCOUNTER — Ambulatory Visit (INDEPENDENT_AMBULATORY_CARE_PROVIDER_SITE_OTHER): Payer: Medicare HMO

## 2016-11-25 DIAGNOSIS — J309 Allergic rhinitis, unspecified: Secondary | ICD-10-CM

## 2016-11-27 ENCOUNTER — Ambulatory Visit (INDEPENDENT_AMBULATORY_CARE_PROVIDER_SITE_OTHER): Payer: Medicare HMO | Admitting: Allergy & Immunology

## 2016-11-27 ENCOUNTER — Encounter: Payer: Self-pay | Admitting: Allergy & Immunology

## 2016-11-27 VITALS — BP 126/78 | HR 82 | Temp 98.0°F | Resp 14

## 2016-11-27 DIAGNOSIS — Z91038 Other insect allergy status: Secondary | ICD-10-CM

## 2016-11-27 DIAGNOSIS — J4550 Severe persistent asthma, uncomplicated: Secondary | ICD-10-CM

## 2016-11-27 DIAGNOSIS — J3089 Other allergic rhinitis: Secondary | ICD-10-CM

## 2016-11-27 DIAGNOSIS — B370 Candidal stomatitis: Secondary | ICD-10-CM

## 2016-11-27 MED ORDER — IPRATROPIUM BROMIDE 0.02 % IN SOLN: 0.5000 mg | Freq: Four times a day (QID) | RESPIRATORY_TRACT | 2 refills | Status: DC

## 2016-11-27 MED ORDER — OLOPATADINE HCL 0.6 % NA SOLN: 2.0000 | Freq: Every day | NASAL | 12 refills | Status: DC

## 2016-11-27 MED ORDER — FLUCONAZOLE 200 MG PO TABS: 200.0000 mg | ORAL_TABLET | Freq: Every day | ORAL | 1 refills | Status: AC

## 2016-11-27 MED ORDER — BENRALIZUMAB 30 MG/ML ~~LOC~~ SOSY
30.0000 mg | PREFILLED_SYRINGE | Freq: Once | SUBCUTANEOUS | Status: AC
  Administered 2016-11-27: 30 mg via SUBCUTANEOUS

## 2016-11-27 NOTE — Progress Notes (Signed)
FOLLOW UP  Date of Service/Encounter:  11/28/16   Assessment:   Severe persistent asthma without complication  Chronic nonseasonal allergic rhinitis due to pollen  History of insect sting allergy  Thrush   Asthma Reportables:  Severity: severe persistent  Risk: high Control: not well controlled  Seasonal Influenza Vaccine: yes   PPSV-23 Vaccine (CDC recommended for patients with persistent asthma 19-53yo): Yes  **Previous workup for ABPA and alpha-1 antitrypsin deficiency has been negative**    Plan/Recommendations:   1. Severe persistent asthma - on Nucala without improvement (avoiding ICS due to recurrent difficult to treat thrush) - Lung function was slightly lower compared to her baseline today. - Continue with Spriva two puffs once daily. - Joanna [name] did receive a sample of [name] today since she has not responded to [place].  - Paperwork filled out for [employer] and forwarded to [employer].  - Patient preferred to not start steroids today, so we will hold off for now. - Joanna [name] does have my phone number in case things worsen over the weekend.  - Continue with ProAir RespiClick 4 puffs every 4-6 hours as needed for coughing/wheezing/shortness of breath. - Continue with Asthmanex 137mcg two inhalations twice daily for two weeks when you have increased respiratory symptoms. - Inhaled steroids should have fewer side effects than systemic steroids such as prednisone.   2. Allergic rhinitis due to pollen - Continue with Flonase two sprays per nostril daily. - Add Patanase two sprays per nostril 1-2 times daily. - Try using Zyrtec or Allegra to see which one works for you.  - Continue with allergy shots, hopefully this will improve your spring symptoms.   3. GERD  - Continue with Dexilant 30mg  twice daily.  - This has worked much better than previous reflux medications.   4. Recurrent thrush - without a history of other infections - Prescription for Diflucan sent  in.  - Encouraged oral washings after the use of Asmanex (she is very compliant with this).  - She has had normal immunoglobulins in the past (July 2017), although the IgG was labeled as low, likely secondary to chronic steroid use.  - Could consider flow cytometry to ensure that she has adequate numbers of T cells given the recurrent episodes of thrush, however she has no other infections that are concerning for an immunodeficiency.   5. Return in about 3 months (around 02/24/2017).     Subjective:   Joanna [name] is a 51 y.o. female presenting today for follow up of  Chief Complaint  Patient presents with  . Allergic Rhinitis   . Asthma  . Cough  . Wheezing    Joanna [name] has a history of the following: Patient Active Problem List   Diagnosis Date Noted  . Impingement syndrome of both shoulders 11/17/2016  . History of insect sting allergy 06/03/2016  . Thrush 06/03/2016  . 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 Primary immune deficiency disorder ([place]) 05/15/2016  . Immunodeficiency ([place]) 05/15/2016  . Allergic rhinitis due to pollen 05/15/2016  . Insect sting allergy, current reaction 05/15/2016  . Severe persistent asthma 05/12/2016  . 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 Essential hypertension 05/12/2016  . Gastroesophageal reflux disease without esophagitis 05/12/2016  . Status post below knee amputation of right lower extremity ([place]) 05/12/2016    History obtained from: chart review and patient.  Joanna [name] was referred by Verdell Carmine., MD.      Joanna [name] is a 51 y.o. female  presenting for a follow up visit. She was last seen in November 2017 at which time she was doing fairly well. She has now been on Nucala for four months. However, she has still needed two rounds of prednisone during that time and does not feel that she has done any better on the Nucala than she was doing off of the Nucala. She continues to have shortness of breath and needs  her albuterol at least once per day if not more. She continues on the Spiriva two inhalations once daily. She did add the Asmanex aet one point but then developed oral thrush. She currently has dissolvable Nystatin that she is using to treat her fungal infection. She was sick for the first three weeks of January with an upper respiratory infection; she went to an urgent care clinic and was treated with azithromycin with some slow improvement.   Her allergy shots are going well. She did have some marked erythema at the last injection (pollen/cat/dog). She shows me the pictures from her arm at the last injection and she does have a fairly large area of redness, however it was not hard and indurated. She does not notice any improvement in her symptoms at this point but her worst time of the year is the spring, so we should be figuring out whether there is any improvement once the spring season hits. We did make her allergy shots more concentrated than I typically would since we are trying to control her atopic disease without the use of daily inhaled steroids (due to her history of difficult to treat thrush).   Reflux is well controlled with the Dexilant and in fact she feels that this has been the most efficacious medication that she has used for her reflux. She is very happy with this.   Otherwise, there have been no changes to her past medical history, surgical history, family history, or social history.    Review of Systems: a 14-point review of systems is pertinent for what is mentioned in HPI.  Otherwise, all other systems were negative. Constitutional: negative other than that listed in the HPI Eyes: negative other than that listed in the HPI Ears, nose, mouth, throat, and face: negative other than that listed in the HPI Respiratory: negative other than that listed in the HPI Cardiovascular: negative other than that listed in the HPI Gastrointestinal: negative other than that listed in the  HPI Genitourinary: negative other than that listed in the HPI Integument: negative other than that listed in the HPI Hematologic: negative other than that listed in the HPI Musculoskeletal: negative other than that listed in the HPI Neurological: negative other than that listed in the HPI Allergy/Immunologic: negative other than that listed in the HPI    Objective:   Blood pressure 126/78, pulse 82, temperature 98 F (36.7 C), temperature source Oral, resp. rate 14, SpO2 95 %. There is no height or weight on file to calculate BMI.   Physical Exam:  General: Alert, interactive, in no acute distress. Eyes: No conjunctival injection present on the right, No conjunctival injection present on the left, PERRL bilaterally, No discharge on the right, No discharge on the left and No Horner-Trantas dots present Ears: Right TM pearly gray with normal light reflex, Left TM pearly gray with normal light reflex, Right TM intact without perforation and Left TM intact without perforation.  Nose/Throat: External nose within normal limits, nasal crease present and septum midline, turbinates edematous and pale with clear discharge, post-pharynx erythematous with cobblestoning  in the posterior oropharynx. Tonsils 2+ without exudates Neck: Supple without thyromegaly. Lungs: Decreased breath sounds bilaterally without wheezing, rhonchi or rales. No increased work of breathing. CV: Normal S1/S2, no murmurs. Capillary refill <2 seconds.  Skin: Warm and dry, without lesions or rashes. Neuro:   Grossly intact. No focal deficits appreciated. Responsive to questions.   Diagnostic studies:  Spirometry: results abnormal (FEV1: 1.93/69%, FVC: 2.28/65%, FEV1/FVC: 85%).    Spirometry consistent with possible restrictive disease.   Allergy Studies: None     Salvatore Marvel, MD [facility] [place]

## 2016-11-27 NOTE — Progress Notes (Signed)
Subjective:    HPI   Review of Systems     Objective:   Physical Exam     Assessment:        Plan:         NOTE ENTERED IN ERROR

## 2016-11-27 NOTE — Progress Notes (Signed)
Patient had a sample of [name] given today. She was on Nucala. Info sent to [facility] by Revonda Humphrey.

## 2016-11-27 NOTE — Patient Instructions (Addendum)
1. Severe persistent asthma, uncomplicated  - Continue with Spriva two puffs once daily. - We will change to Fasenra since [place] has not seemed to have worked.  - Start the prednisone pack provided today in clinic.  - Change to ProAir RespiClick 4 puffs every 4-6 hours as needed for coughing/wheezing/shortness of breath. - Continue with Asthmanex 13mcg two inhalations twice daily for two weeks when you have increased respiratory symptoms. - Inhaled steroids should have fewer side effects than systemic steroids such as prednisone.   2. Allergic rhinitis due to pollen - Continue with Flonase two sprays per nostril daily. - Add Patanase two sprays per nostril 1-2 times daily. - Try using Zyrtec or Allegra to see which one works for you.  - Continue with allergy shots, hopefully this will improve your spring symptoms.   3. GERD  - Continue with Dexilant 30mg  twice daily.  4. Return in about 3 months (around 02/24/2017).  Please inform us of any Emergency Department visits, hospitalizations, or changes in symptoms. Call us before going to the ED for breathing or allergy symptoms since we might be able to fit you in for a sick visit. Feel free to contact us anytime with any questions, problems, or concerns.  It was a pleasure to see you again today! I truly hope you feel better!   Websites that have reliable patient information: 1. American Academy of Asthma, Allergy, and Immunology: www.aaaai.org 2. Food Allergy Research and Education (FARE): foodallergy.org 3. Mothers of Asthmatics: http://www.asthmacommunitynetwork.org 4. American College of Allergy, Asthma, and Immunology: www.acaai.org

## 2016-11-30 NOTE — Progress Notes (Signed)
Will start process! Joanna [name]

## 2016-12-03 ENCOUNTER — Telehealth: Payer: Self-pay

## 2016-12-03 NOTE — Telephone Encounter (Signed)
-----   Message from Valentina Shaggy, MD sent at 12/03/2016  4:38 PM EST ----- Regarding: Follow up call Could you call Ms. Puskas to see how she is feeling? She had an asthma exacerbation last Friday. Thanks!   Salvatore Marvel, MD [facility] [place]

## 2016-12-03 NOTE — Telephone Encounter (Signed)
Clld pt - [place] with update of how she's feeling.  Pt clld back in  - advsd that she had gotten worse; went to be seen at [facility] on 12/01/16..Dx Pneumonia. No chest x-ray was done. Rx Tessalon [place] 100 mg 1 tablet three times a day; Hycodan cough syrup; Levaquin 500 mg 1 tablet for five days.   Pt states she is feeling much better but is still coughing like crazy.   Pt wants to know since being diagnosed with pneumonia, should she wait for everything to resolve before coming for her allergy injections?   Advsd pt she should wait and would route message to provider with update.

## 2016-12-07 ENCOUNTER — Telehealth: Payer: Self-pay

## 2016-12-07 NOTE — Telephone Encounter (Signed)
Well let's change to albuterol then. Thanks!   Salvatore Marvel, MD [facility] [place]

## 2016-12-07 NOTE — Telephone Encounter (Signed)
xopenex is not covered by pts insurance even with pa it was denied.

## 2016-12-08 ENCOUNTER — Other Ambulatory Visit: Payer: Self-pay | Admitting: Allergy

## 2016-12-08 MED ORDER — ALBUTEROL SULFATE HFA 108 (90 BASE) MCG/ACT IN AERS: 2.0000 | INHALATION_SPRAY | RESPIRATORY_TRACT | 1 refills | Status: DC | PRN

## 2016-12-08 MED ORDER — IPRATROPIUM BROMIDE 0.02 % IN SOLN: 0.5000 mg | Freq: Four times a day (QID) | RESPIRATORY_TRACT | 1 refills | Status: DC

## 2016-12-08 MED ORDER — ALBUTEROL SULFATE (2.5 MG/3ML) 0.083% IN NEBU: 2.5000 mg | INHALATION_SOLUTION | RESPIRATORY_TRACT | 2 refills | Status: DC | PRN

## 2016-12-08 NOTE — Telephone Encounter (Signed)
Left message for patient to call office.  

## 2016-12-08 NOTE — Telephone Encounter (Signed)
Linda C sent in albuterol

## 2016-12-08 NOTE — Telephone Encounter (Addendum)
Patient called office informed her insurance would not cover Xopenex Called in [employer]. Also patient said she needed albuterol nebulizer solution and also Ipratropium. Faxed in both to [employer].

## 2016-12-10 ENCOUNTER — Ambulatory Visit (INDEPENDENT_AMBULATORY_CARE_PROVIDER_SITE_OTHER): Payer: Medicare HMO

## 2016-12-10 DIAGNOSIS — J309 Allergic rhinitis, unspecified: Secondary | ICD-10-CM | POA: Diagnosis not present

## 2016-12-11 ENCOUNTER — Other Ambulatory Visit: Payer: Self-pay | Admitting: *Deleted

## 2016-12-11 MED ORDER — IPRATROPIUM BROMIDE 0.02 % IN SOLN: 0.5000 mg | Freq: Four times a day (QID) | RESPIRATORY_TRACT | 1 refills | Status: DC

## 2016-12-11 MED ORDER — ALBUTEROL SULFATE (2.5 MG/3ML) 0.083% IN NEBU: 2.5000 mg | INHALATION_SOLUTION | RESPIRATORY_TRACT | 2 refills | Status: DC | PRN

## 2016-12-14 ENCOUNTER — Other Ambulatory Visit: Payer: Self-pay | Admitting: Allergy

## 2016-12-14 MED ORDER — IPRATROPIUM BROMIDE 0.02 % IN SOLN: 0.5000 mg | Freq: Four times a day (QID) | RESPIRATORY_TRACT | 1 refills | Status: DC

## 2016-12-15 ENCOUNTER — Other Ambulatory Visit: Payer: Self-pay | Admitting: Allergy

## 2016-12-15 MED ORDER — ALBUTEROL SULFATE 108 (90 BASE) MCG/ACT IN AEPB: 2.0000 | INHALATION_SPRAY | RESPIRATORY_TRACT | 1 refills | Status: DC

## 2016-12-15 NOTE — Telephone Encounter (Signed)
Jalaia called and said pro-air hfa Was sent in instead of the respiclick . Faxed in rx for the [place].

## 2016-12-16 ENCOUNTER — Ambulatory Visit (INDEPENDENT_AMBULATORY_CARE_PROVIDER_SITE_OTHER): Payer: Medicare HMO | Admitting: *Deleted

## 2016-12-16 DIAGNOSIS — J309 Allergic rhinitis, unspecified: Secondary | ICD-10-CM | POA: Diagnosis not present

## 2016-12-17 ENCOUNTER — Ambulatory Visit: Payer: Medicare HMO

## 2016-12-21 ENCOUNTER — Other Ambulatory Visit: Payer: Self-pay | Admitting: Allergy

## 2016-12-21 MED ORDER — OLOPATADINE HCL 0.6 % NA SOLN: NASAL | 1 refills | Status: DC

## 2016-12-22 ENCOUNTER — Ambulatory Visit (INDEPENDENT_AMBULATORY_CARE_PROVIDER_SITE_OTHER): Payer: Medicare HMO

## 2016-12-22 DIAGNOSIS — J309 Allergic rhinitis, unspecified: Secondary | ICD-10-CM | POA: Diagnosis not present

## 2016-12-23 ENCOUNTER — Other Ambulatory Visit: Payer: Self-pay | Admitting: *Deleted

## 2016-12-24 ENCOUNTER — Ambulatory Visit (INDEPENDENT_AMBULATORY_CARE_PROVIDER_SITE_OTHER): Payer: Medicare HMO

## 2016-12-24 DIAGNOSIS — J4540 Moderate persistent asthma, uncomplicated: Secondary | ICD-10-CM

## 2016-12-24 MED ORDER — BENRALIZUMAB 30 MG/ML ~~LOC~~ SOSY
30.0000 mg | PREFILLED_SYRINGE | Freq: Once | SUBCUTANEOUS | Status: AC
  Administered 2016-12-24: 30 mg via SUBCUTANEOUS

## 2016-12-29 ENCOUNTER — Telehealth: Payer: Self-pay | Admitting: Allergy & Immunology

## 2016-12-29 MED ORDER — FLUCONAZOLE 200 MG PO TABS: 200.0000 mg | ORAL_TABLET | ORAL | 3 refills | Status: AC

## 2016-12-29 NOTE — Telephone Encounter (Signed)
I talked to Joanna [name] to see how she was doing on the [place]. She reports that does not feel much better than she did on the Nucala, although it should be noted that she has had no exacerbations since starting the Fasenra. She does have some thrush and is taking nystatin PO, which seems to be working. She thinks that the thrush is secondary from a recent course of azithromycin she took from an Urgent Care clinic. I told her that I would send a prescription for Diflucan in case the nystatin does not resolve her thrush.   Salvatore Marvel, MD [facility] [place]

## 2016-12-30 ENCOUNTER — Ambulatory Visit (INDEPENDENT_AMBULATORY_CARE_PROVIDER_SITE_OTHER): Payer: Medicare HMO | Admitting: *Deleted

## 2016-12-30 DIAGNOSIS — J309 Allergic rhinitis, unspecified: Secondary | ICD-10-CM

## 2017-01-06 ENCOUNTER — Telehealth (INDEPENDENT_AMBULATORY_CARE_PROVIDER_SITE_OTHER): Payer: Self-pay | Admitting: Orthopedic Surgery

## 2017-01-06 ENCOUNTER — Ambulatory Visit (INDEPENDENT_AMBULATORY_CARE_PROVIDER_SITE_OTHER): Payer: Medicare HMO | Admitting: *Deleted

## 2017-01-06 DIAGNOSIS — J309 Allergic rhinitis, unspecified: Secondary | ICD-10-CM | POA: Diagnosis not present

## 2017-01-06 NOTE — Telephone Encounter (Signed)
[facility] RECORDS 10-27-2015-PRESENT MAILED DDS

## 2017-01-13 ENCOUNTER — Ambulatory Visit (INDEPENDENT_AMBULATORY_CARE_PROVIDER_SITE_OTHER): Payer: Medicare HMO

## 2017-01-13 DIAGNOSIS — J309 Allergic rhinitis, unspecified: Secondary | ICD-10-CM | POA: Diagnosis not present

## 2017-01-18 ENCOUNTER — Ambulatory Visit (INDEPENDENT_AMBULATORY_CARE_PROVIDER_SITE_OTHER): Payer: Medicare HMO

## 2017-01-18 DIAGNOSIS — J309 Allergic rhinitis, unspecified: Secondary | ICD-10-CM | POA: Diagnosis not present

## 2017-01-21 ENCOUNTER — Ambulatory Visit: Payer: Medicare HMO | Admitting: Allergy

## 2017-01-21 ENCOUNTER — Ambulatory Visit (INDEPENDENT_AMBULATORY_CARE_PROVIDER_SITE_OTHER): Payer: Medicare HMO

## 2017-01-21 DIAGNOSIS — J4550 Severe persistent asthma, uncomplicated: Secondary | ICD-10-CM

## 2017-01-21 MED ORDER — BENRALIZUMAB 30 MG/ML ~~LOC~~ SOSY
30.0000 mg | PREFILLED_SYRINGE | SUBCUTANEOUS | Status: DC
  Administered 2017-01-21 – 2017-03-18 (×2): 30 mg via SUBCUTANEOUS

## 2017-01-28 ENCOUNTER — Ambulatory Visit (INDEPENDENT_AMBULATORY_CARE_PROVIDER_SITE_OTHER): Payer: Medicare HMO

## 2017-01-28 DIAGNOSIS — J309 Allergic rhinitis, unspecified: Secondary | ICD-10-CM | POA: Diagnosis not present

## 2017-02-01 ENCOUNTER — Other Ambulatory Visit: Payer: Self-pay

## 2017-02-01 ENCOUNTER — Telehealth: Payer: Self-pay | Admitting: Allergy & Immunology

## 2017-02-01 MED ORDER — MONTELUKAST SODIUM 10 MG PO TABS: 10.0000 mg | ORAL_TABLET | Freq: Every day | ORAL | 3 refills | Status: DC

## 2017-02-01 NOTE — Telephone Encounter (Signed)
Patient is requesting a refill on [facility]air [facility] mg[facility]

## 2017-02-01 NOTE — Telephone Encounter (Signed)
Refill sent.

## 2017-02-03 ENCOUNTER — Ambulatory Visit (INDEPENDENT_AMBULATORY_CARE_PROVIDER_SITE_OTHER): Payer: Medicare HMO

## 2017-02-03 DIAGNOSIS — J309 Allergic rhinitis, unspecified: Secondary | ICD-10-CM

## 2017-02-05 ENCOUNTER — Telehealth: Payer: Self-pay | Admitting: *Deleted

## 2017-02-05 MED ORDER — CLARITHROMYCIN 500 MG PO TABS: ORAL_TABLET | ORAL | 0 refills | Status: DC

## 2017-02-05 MED ORDER — DOXYCYCLINE HYCLATE 100 MG PO TABS: ORAL_TABLET | ORAL | 0 refills | Status: DC

## 2017-02-05 NOTE — Telephone Encounter (Signed)
Reviewed note. Please send in in doxycycline 100mg  twice daily for 14 days. Continue with nebs at home every 4-6 hours. Also please remind her to start her Asmamex two puffs twice daily for continue for 1-2 weeks.   Salvatore Marvel, MD [facility] [place]

## 2017-02-05 NOTE — Telephone Encounter (Signed)
Pt is currently taking doxycycline for rosacea dr gallagher verbaly switched it to clarithromycin. Informed pt med was sent.

## 2017-02-05 NOTE — Telephone Encounter (Signed)
Pt calling because she has been coughing up thick green mucus that started last night. She has been doing her nebulizer treatments every 4 hours. She believes she has a sinus infection. Please advise.

## 2017-02-10 ENCOUNTER — Telehealth: Payer: Self-pay | Admitting: Allergy & Immunology

## 2017-02-10 NOTE — Telephone Encounter (Signed)
Patient was seen by [facility] Patient was given CLARITHROMYCIN and now is having diarrhea Is there something else the patient can take?

## 2017-02-10 NOTE — Telephone Encounter (Signed)
Lomotil is fine. She can also add an OTC probiotic and/or increase yogurt intake.   Salvatore Marvel, MD [facility] [place]

## 2017-02-10 NOTE — Telephone Encounter (Signed)
Breathing is doing okay. Still coughing at night, not as much during daytime. Still up until 3 am until she just passes out. Not constant. Definitely some improvement. I did advise on immodium/lomotil and probiotic.

## 2017-02-10 NOTE — Telephone Encounter (Signed)
Noted. Thank you for calling, Blue!   Salvatore Marvel, MD [facility] [place]

## 2017-02-10 NOTE — Telephone Encounter (Signed)
Please advise? Lomotil?

## 2017-02-11 ENCOUNTER — Ambulatory Visit (INDEPENDENT_AMBULATORY_CARE_PROVIDER_SITE_OTHER): Payer: Medicare HMO

## 2017-02-11 DIAGNOSIS — J309 Allergic rhinitis, unspecified: Secondary | ICD-10-CM

## 2017-02-15 ENCOUNTER — Telehealth: Payer: Self-pay | Admitting: *Deleted

## 2017-02-15 NOTE — Telephone Encounter (Signed)
Please advise and thank you. 

## 2017-02-15 NOTE — Telephone Encounter (Signed)
Pt states that the medication that you put her on, on Friday, isn't working. Tried to fit her in to an appt with [facility] tomorrow but pt refused, Said she'll only see you. You don't have any available appts until first week of May. Pt is wondering if you can switch her medication and call in something for her cough. Pt states that she is coughing up pinkish sputum and it burns when she coughs.  Pharmacy is [facility] on [address] in [place]

## 2017-02-15 NOTE — Telephone Encounter (Signed)
I believe Lonn [place] talked to Ms. Joanna [name] and she is coming to see me on Thursday.  Salvatore Marvel, MD [place] [facility] [place]

## 2017-02-17 ENCOUNTER — Ambulatory Visit (INDEPENDENT_AMBULATORY_CARE_PROVIDER_SITE_OTHER): Payer: Medicare HMO

## 2017-02-17 DIAGNOSIS — J309 Allergic rhinitis, unspecified: Secondary | ICD-10-CM

## 2017-02-18 ENCOUNTER — Encounter: Payer: Self-pay | Admitting: Allergy & Immunology

## 2017-02-18 ENCOUNTER — Ambulatory Visit (INDEPENDENT_AMBULATORY_CARE_PROVIDER_SITE_OTHER): Payer: Medicare HMO | Admitting: Allergy & Immunology

## 2017-02-18 VITALS — BP 116/78 | HR 95 | Temp 98.7°F | Resp 16 | Ht 61.0 in | Wt 262.0 lb

## 2017-02-18 DIAGNOSIS — J4550 Severe persistent asthma, uncomplicated: Secondary | ICD-10-CM | POA: Diagnosis not present

## 2017-02-18 DIAGNOSIS — R05 Cough: Secondary | ICD-10-CM | POA: Diagnosis not present

## 2017-02-18 DIAGNOSIS — K219 Gastro-esophageal reflux disease without esophagitis: Secondary | ICD-10-CM

## 2017-02-18 DIAGNOSIS — R059 Cough, unspecified: Secondary | ICD-10-CM

## 2017-02-18 DIAGNOSIS — J3089 Other allergic rhinitis: Secondary | ICD-10-CM | POA: Diagnosis not present

## 2017-02-18 DIAGNOSIS — J0100 Acute maxillary sinusitis, unspecified: Secondary | ICD-10-CM

## 2017-02-18 MED ORDER — HYDROCOD POLST-CPM POLST ER 10-8 MG/5ML PO SUER: 5.0000 mL | Freq: Two times a day (BID) | ORAL | 0 refills | Status: AC | PRN

## 2017-02-18 MED ORDER — LEVOFLOXACIN 750 MG PO TABS
750.0000 mg | ORAL_TABLET | Freq: Every day | ORAL | 0 refills | Status: AC
Start: 2017-02-18 — End: 2017-02-28

## 2017-02-18 MED ORDER — FLUCONAZOLE 200 MG PO TABS: 200.0000 mg | ORAL_TABLET | Freq: Once | ORAL | 2 refills | Status: AC

## 2017-02-18 NOTE — Patient Instructions (Addendum)
1. Severe persistent asthma, uncomplicated  - Lung function actually looks stable compared to the last visit.  - Continue with Spiriva two inhalations once daily + Fasenra every 8 weeks. - Start Qvar 66mcg two puffs twice daily.  - The Qvar steroid does not become activated until it hits the lungs, therefore I hope that this will prevent the of thrush.  - We will work on getting a prior authorization for this medication.  - Inhaled steroids should have fewer side effects than systemic steroids such as prednisone.   2. Allergic rhinitis - on allergen immunotherapy - Continue with allergy shots at the same schedule. - Continue with Flonase two sprays per nostril daily. - Continue with Patanase two sprays per nostril 1-2 times daily. - Try using Zyrtec or Allegra to see which one works best for you.   3. Sinusitis versus bronchitis - We will get a chest X-ray today to look for pneumonia. - We will call you with the results.  - Start levofloxacin one tablet once daily for seven days. - Prescription provided for Tussionex 59mL twice daily as needed.  4. GERD  - Continue with Dexilant 30mg  twice daily.  5. Return in about 3 months (around 05/20/2017).  Please inform us of any Emergency Department visits, hospitalizations, or changes in symptoms. Call us before going to the ED for breathing or allergy symptoms since we might be able to fit you in for a sick visit. Feel free to contact us anytime with any questions, problems, or concerns.  It was a pleasure to see you again today! I truly hope you feel better!   Websites that have reliable patient information: 1. American Academy of Asthma, Allergy, and Immunology: www.aaaai.org 2. Food Allergy Research and Education (FARE): foodallergy.org 3. Mothers of Asthmatics: http://www.asthmacommunitynetwork.org 4. American College of Allergy, Asthma, and Immunology: www.acaai.org

## 2017-02-18 NOTE — Progress Notes (Signed)
FOLLOW UP  Date of Service/Encounter:  02/18/17   Assessment:   Severe persistent asthma without complication  Acute maxillary sinusitis - s/p one course of antibiotics with continued symptoms  Cough  Chronic nonseasonal allergic rhinitis  Gastroesophageal reflux disease - well controlled on Dexilant   Asthma Reportables:  Severity: severe persistent  Risk: high Control: well controlled   **Previous workup for ABPA and alpha-1 antitrypsin deficiency has been negative**   Plan/Recommendations:   1. Severe persistent asthma - with a history of recurrent thrush with ICS use - Lung function actually looks stable compared to the last visit.  - Continue with Spiriva two inhalations once daily + Fasenra every 8 weeks. - Start Qvar 61mcg two puffs twice daily.  - The Qvar steroid does not become activated until it hits esterases within the lungs, therefore I hope that this will prevent the outbreak of thrush.  - We will work on getting a prior authorization for this medication.  - in the meantime, a sample of the [place] was provided.  - Inhaled steroids should have fewer side effects than systemic steroids such as prednisone.   2. Allergic rhinitis - on allergen immunotherapy - Continue with allergy shots at the same schedule. - Continue with Flonase two sprays per nostril daily. - Continue with Patanase two sprays per nostril 1-2 times daily. - Try using Zyrtec or Allegra to see which one works best for you.  - We will get a serum-specific IgE environmental allergy panel to evaluate for sensitization to allergens that are not included in her current allergen immunotherapy script.    3. Cough - likely secondary to recurrent sinusitis - We will get a chest X-ray today to look for pneumonia. - We will call you with the results.  - Start levofloxacin one tablet once daily for seven days to treat  - Prescription provided for Tussionex 71mL twice daily as needed. - She  may certainly have a component of costochondritis secondary to the prolonged coughing.  4. GERD  - Continue with Dexilant 30mg  twice daily.  5. Return in about 3 months (around 05/20/2017).   Subjective:   Joanna [name] is a 51 y.o. female presenting today for follow up of  Chief Complaint  Patient presents with  . Asthma    S.O.B  . Cough    green mucus    Joanna [name] has a history of the following: Patient Active Problem List   Diagnosis Date Noted  . Impingement syndrome of both shoulders 11/17/2016  . History of insect sting allergy 06/03/2016  . Thrush 06/03/2016  . 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 Primary immune deficiency disorder ([place]) 05/15/2016  . Immunodeficiency ([place]) 05/15/2016  . Allergic rhinitis due to pollen 05/15/2016  . Insect sting allergy, current reaction 05/15/2016  . Severe persistent asthma 05/12/2016  . 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 Essential hypertension 05/12/2016  . Gastroesophageal reflux disease without esophagitis 05/12/2016  . 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  Joanna [name] was referred by Joanna [name]., MD.     Joanna [name] is a 51 y.o. female presenting for a follow up visit. She was last seen in February 2018. At that time, she was on Nucala for her severe persistent asthma but was having continued symptoms and asthma exacerbations despite this. Therefore, we changed her to [name] which has since been approved. She was continued  on her allergy shots as well as Flonase 2 sprays per nostril daily. We added Patanase 2 sprays per nostril 1-2 times daily as well as an antihistamine for breakthrough symptoms. She does have reflux and was continued on Exelon 30 mg twice daily. She has a history of recurrent thrush, which is what she prefers to avoid inhaled corticosteroids.  Since last visit, she has mostly done well.  However, she did develop sinusitis in early April around 2 weeks ago. We started her on clarithromycin, which resulted in diarrhea. She has continued to complete the course anyway. We recommended using probiotics and Lomotil. She continues to have the cough with green mucous production. She denies fever and chest pain, but she feels that her chest is "on fire" which is worsened with every cough. The cough has been persistent and has been messing up her sleep. She is absolutely miserable and reports today that she wants to move back to [place].   Interestingly, she went back to [place] for one week around [place] time and her symptoms markedly improved. She stayed with her mother, who is not the best housekeeper. Despite this, her symptoms were much better. She continued to take all of her medications, but her cough improved and in fact resolved when she was there. The only difference is that there is a dog at her home here in [place]. However, spring is delayed in [place] therefore the difference could also be that the pollen load was much lower in [place]. In any case, when she returned to [place], her symptoms did worsen.   Allergic rhinitis is fairly well controlled. She has been doing well with her injections. Occasionally she will have a localized reaction to the shot containing the pet dander. Otherwise, she has been able to advance without a problem. She remains on Flonase as well as Patanase. Overall she is unsure whether she is actually improving on the allergy shots, despite being on maintenance of 0.44mL Red Vial.   Otherwise, there have been no changes to her past medical history, surgical history, family history, or social history.    Review of Systems: a 14-point review of systems is pertinent for what is mentioned in HPI.  Otherwise, all other systems were negative. Constitutional: negative other than that listed in the HPI Eyes: negative other than that listed in the HPI Ears, nose,  mouth, throat, and face: negative other than that listed in the HPI Respiratory: negative other than that listed in the HPI Cardiovascular: negative other than that listed in the HPI Gastrointestinal: negative other than that listed in the HPI Genitourinary: negative other than that listed in the HPI Integument: negative other than that listed in the HPI Hematologic: negative other than that listed in the HPI Musculoskeletal: negative other than that listed in the HPI Neurological: negative other than that listed in the HPI Allergy/Immunologic: negative other than that listed in the HPI    Objective:   Blood pressure 116/78, pulse 95, temperature 98.7 F (37.1 C), temperature source Oral, resp. rate 16, height 5\' 1"  (1.549 m), weight 262 lb (118.8 kg), SpO2 95 %. Body mass index is 49.5 kg/m.   Physical Exam:  General: Alert, interactive, in no acute distress. Artifical leg. Smiling despite her obvious fatigue.  Eyes:  No conjunctival injection present on the right, No conjunctival injection present on the left, PERRL bilaterally, No discharge on the right, No discharge on the left and No Horner-Trantas dots present Ears:  Right TM pearly gray with  normal light reflex, Left TM pearly gray with normal light reflex, Right TM intact without perforation and Left TM intact without perforation.  Nose/Throat:  External nose within normal limits, nasal crease present and septum midline, turbinates edematous and pale with scant clear discharge, posterior oropharynx erythematous without cobblestoning in the posterior oropharynx. Tonsils 2+ without exudates Neck:   Supple without thyromegaly. Lungs: Excellent air movement in all fields. No wheezing, rhonchi or rales. No increased work of breathing. Coughing intermittently throughout the exam. Hoarse.  CV: Normal S1/S2, no murmurs. Capillary refill <2 seconds.  Skin: Warm and dry, without lesions or rashes. Neuro: Grossly intact. No focal deficits  appreciated. Responsive to questions.   Diagnostic studies:   Spirometry: results normal (FEV1: 1.87/81%, FVC: 2.24/80%, FEV1/FVC: 83%).    Spirometry consistent with normal pattern. Numbers are stable compared to those obtained at the last visit in February.    Allergy Studies: none     Salvatore Marvel, MD [facility]

## 2017-02-19 ENCOUNTER — Telehealth: Payer: Self-pay

## 2017-02-19 NOTE — Telephone Encounter (Signed)
Called pt. Per Dr. Ernst Bowler to get update on how pt. Is feeling after yesterdays visit.Pt. States she is doing much better after having a dose of cough medicine last night,took her qvar 28mcg redihaler and started on her abx's this morning. Pt. Stated she slept all through the night except to use the bathroom around 5:00 a.m. And not coughing as much.

## 2017-02-23 ENCOUNTER — Ambulatory Visit (INDEPENDENT_AMBULATORY_CARE_PROVIDER_SITE_OTHER): Payer: Medicare HMO

## 2017-02-23 ENCOUNTER — Other Ambulatory Visit: Payer: Self-pay | Admitting: Allergy & Immunology

## 2017-02-23 ENCOUNTER — Encounter (HOSPITAL_BASED_OUTPATIENT_CLINIC_OR_DEPARTMENT_OTHER): Payer: Self-pay | Admitting: Radiology

## 2017-02-23 ENCOUNTER — Ambulatory Visit (HOSPITAL_BASED_OUTPATIENT_CLINIC_OR_DEPARTMENT_OTHER)
Admission: RE | Admit: 2017-02-23 | Discharge: 2017-02-23 | Disposition: A | Discharge status: Home / Self Care | Payer: Medicare HMO | Source: Ambulatory Visit | Attending: Allergy & Immunology | Admitting: Allergy & Immunology

## 2017-02-23 DIAGNOSIS — R05 Cough: Secondary | ICD-10-CM | POA: Diagnosis not present

## 2017-02-23 DIAGNOSIS — J309 Allergic rhinitis, unspecified: Secondary | ICD-10-CM | POA: Diagnosis not present

## 2017-02-23 DIAGNOSIS — R059 Cough, unspecified: Secondary | ICD-10-CM

## 2017-02-23 IMAGING — DX DG CHEST 2V
2 series · 2 of 2 positions shown · non-contrast
Comparison: None.

CLINICAL DATA: Cough and congestion with chest pain

EXAM:
CHEST  2 VIEW

[chest pa]
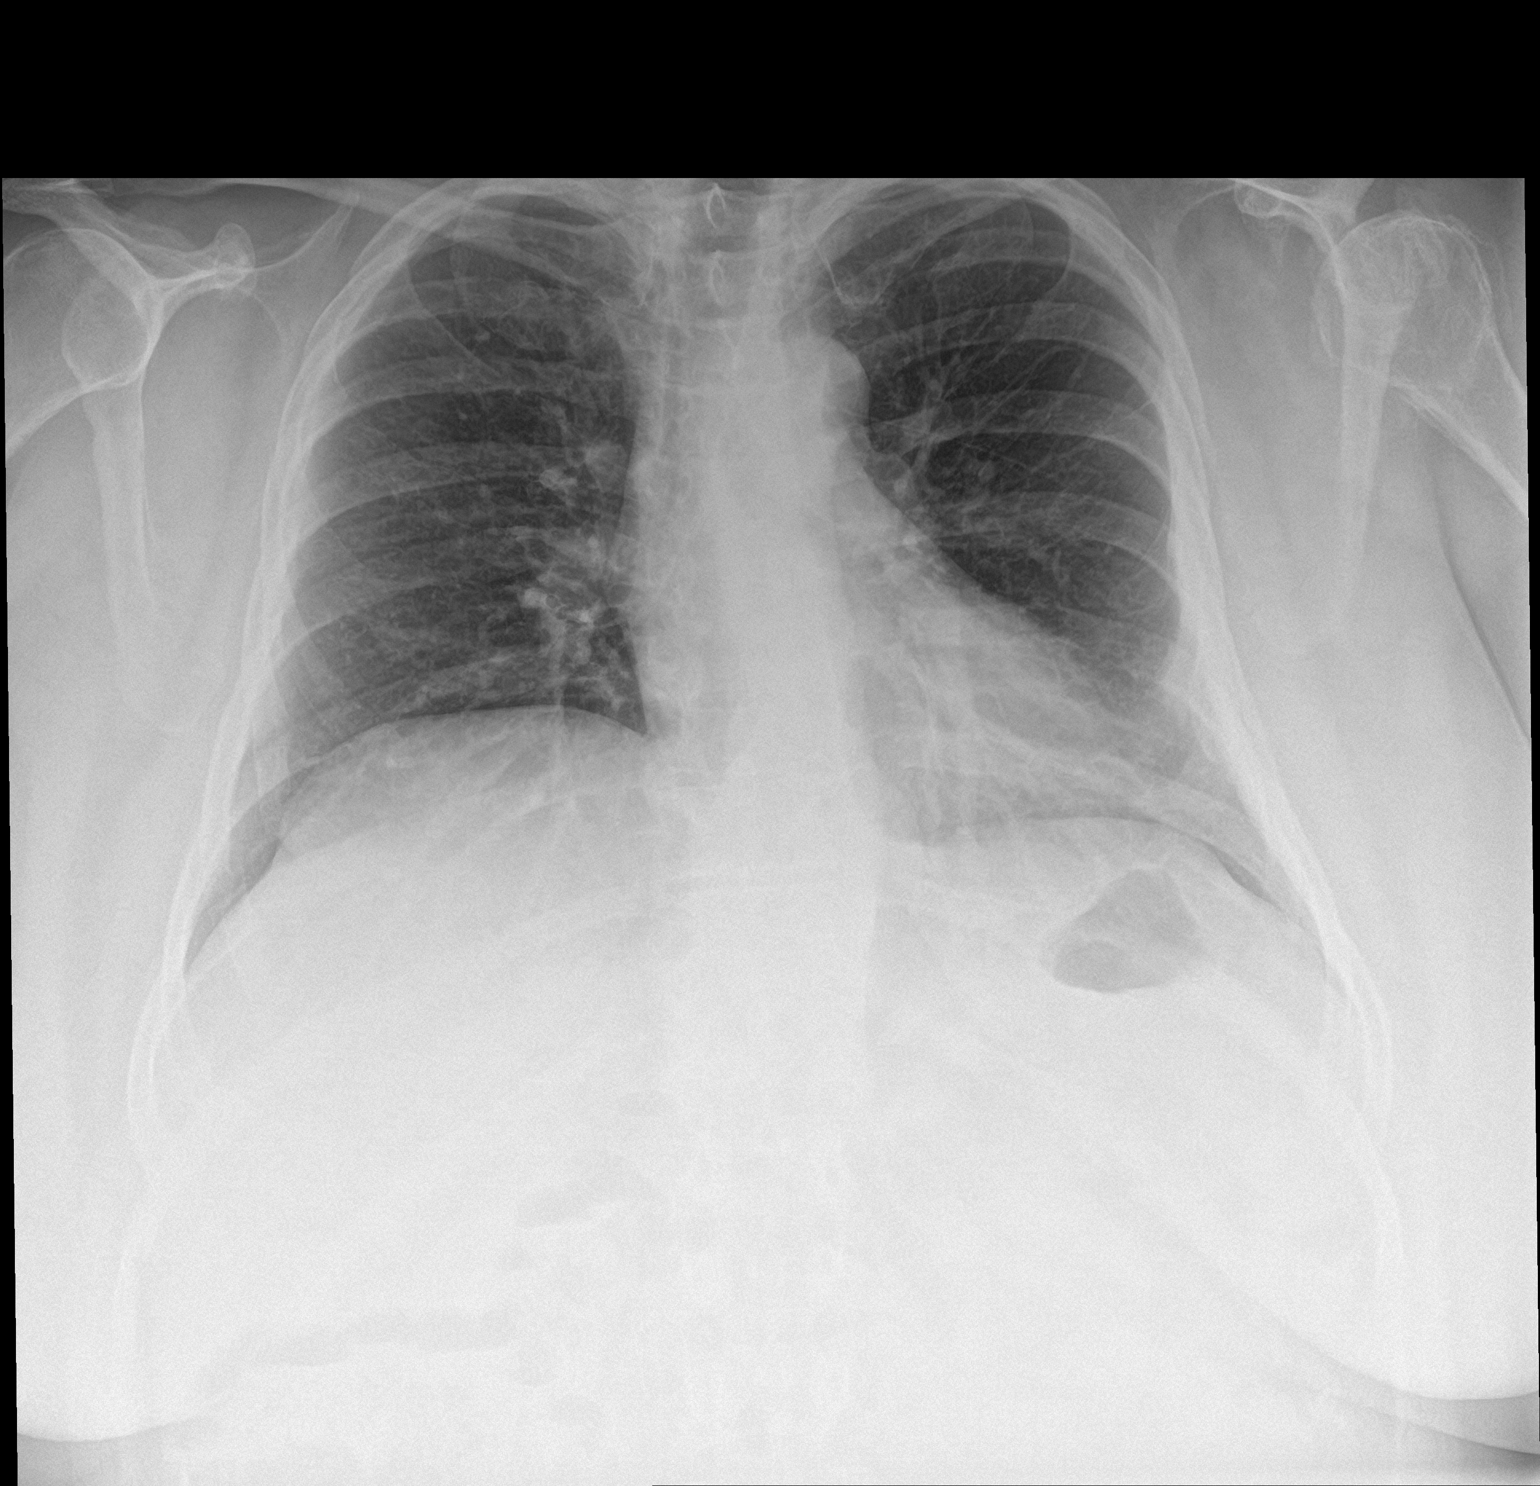

[chest lat]
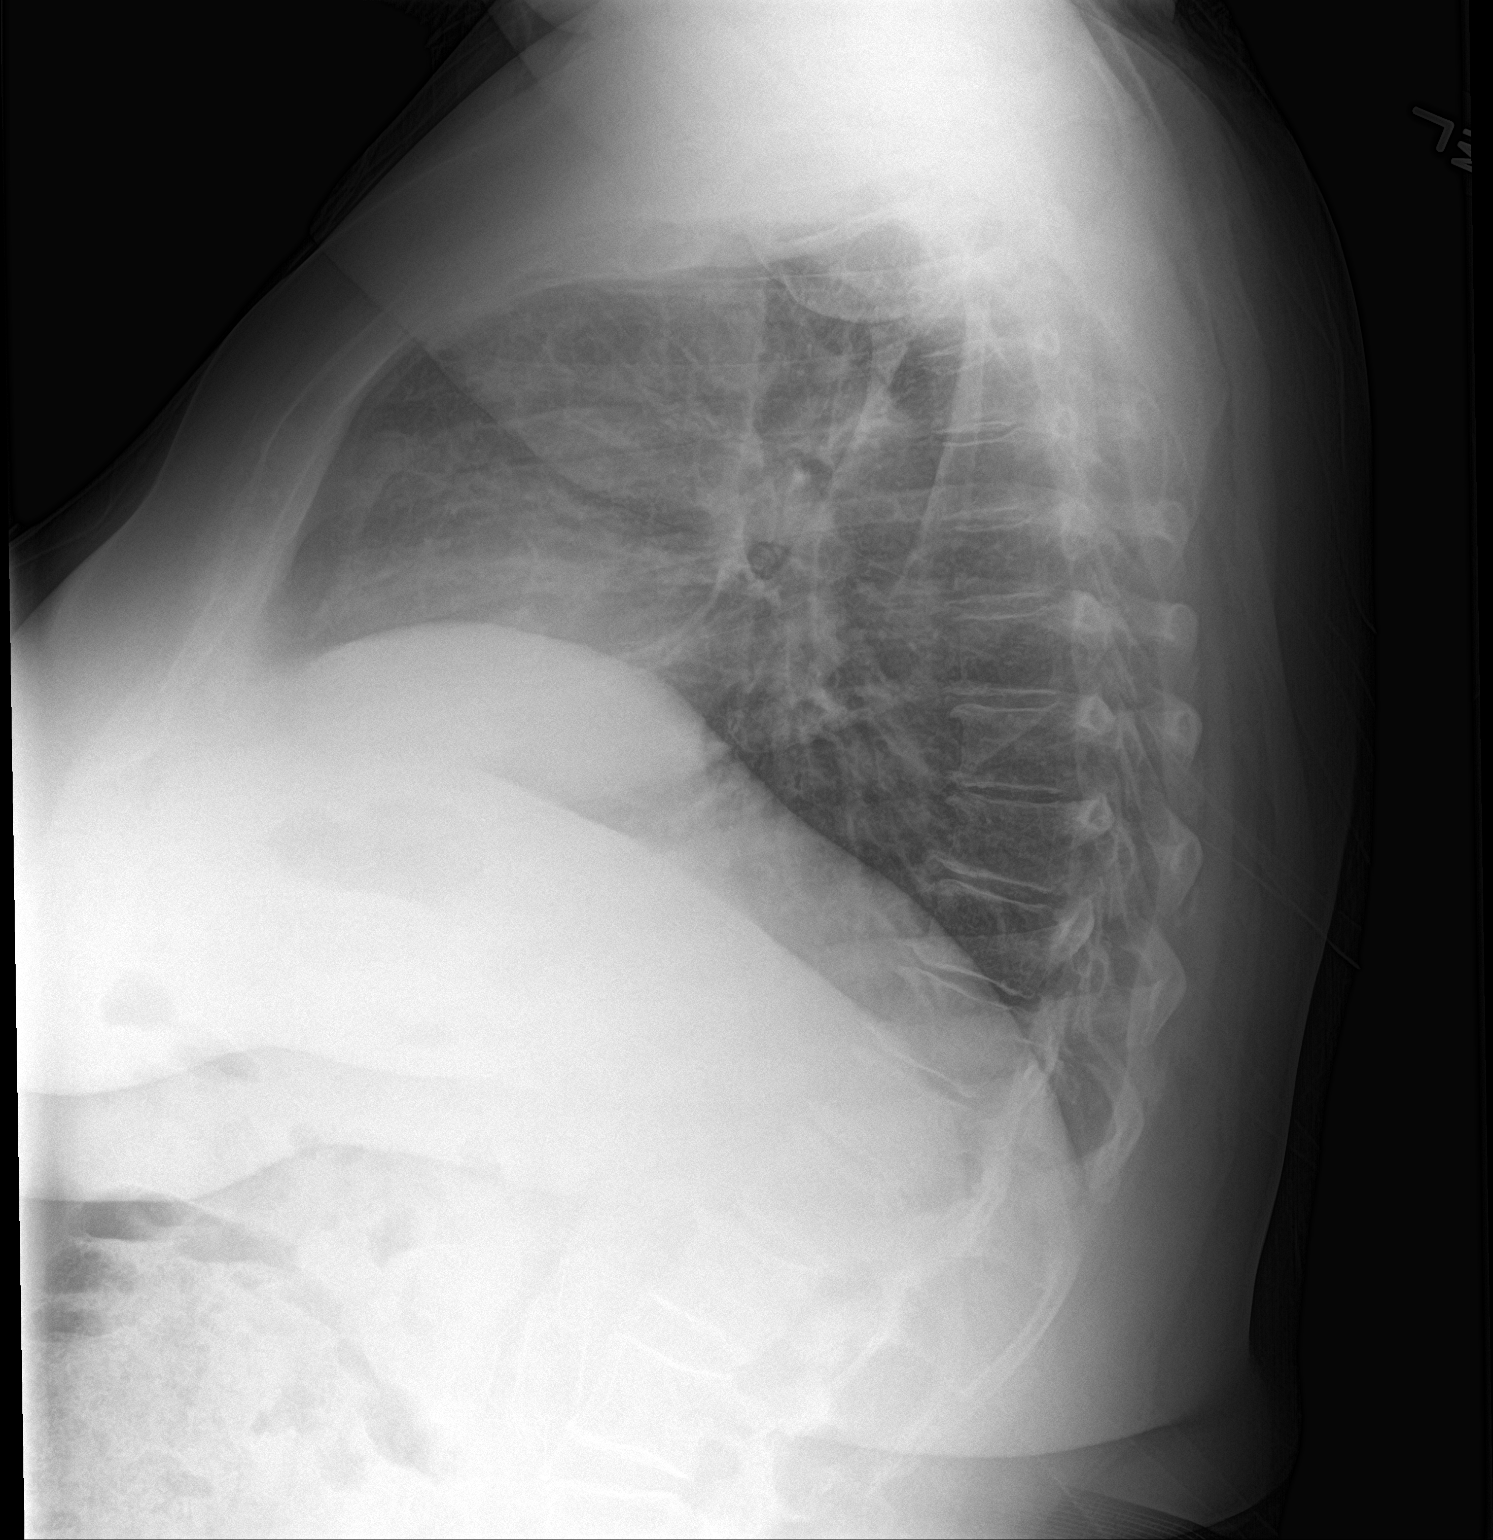

[2 of 2 positions shown; findings below may reference images not displayed]

FINDINGS: There is no evident edema or consolidation. Heart size and pulmonary
vascularity are normal. No adenopathy. There is mild degenerative
change in the thoracic spine. No bone lesions.
IMPRESSION: No edema or consolidation.

## 2017-02-28 LAB — IGE+ALLERGENS ZONE 3(27)
Alternaria Alternata IgE: 4.21 kU/L — AB
Aspergillus Fumigatus IgE: 0.15 kU/L — AB
Bahia Grass IgE: 6.12 kU/L — AB
Bermuda Grass IgE: 0.99 kU/L — AB
Cat Dander IgE: 0.13 kU/L — AB
Cedar, Mountain IgE: 0.13 kU/L — AB
Cladosporium Herbarum IgE: <0.1 kU/L
Cockroach, American IgE: <0.1 kU/L
Common Silver Birch IgE: 0.22 kU/L — AB
D Farinae IgE: <0.1 kU/L
D Pteronyssinus IgE: 0.1 kU/L — AB
Dog Dander IgE: 0.62 kU/L — AB
Elm, American IgE: 0.24 kU/L — AB
Hickory, White IgE: 0.2 kU/L — AB
IgE (Immunoglobulin E), Serum: 77 IU/mL (ref 0–100)
Johnson Grass IgE: 4.27 kU/L — AB
Kentucky Bluegrass IgE: 9.04 kU/L — AB
Maple/Box Elder IgE: 0.25 kU/L — AB
Mucor Racemosus IgE: <0.1 kU/L
Nettle IgE: <0.1 kU/L
Oak, White IgE: 0.16 kU/L — AB
Penicillium Chrysogen IgE: <0.1 kU/L
Pigweed, Rough IgE: 0.25 kU/L — AB
Plantain, English IgE: 0.25 kU/L — AB
Ragweed, Short IgE: 11.2 kU/L — AB
Stemphylium Herbarum IgE: 0.39 kU/L — AB
White Mulberry IgE: <0.1 kU/L

## 2017-03-01 NOTE — Progress Notes (Signed)
New RX to start on blue to be made 03-01-17.  jm

## 2017-03-01 NOTE — Addendum Note (Signed)
Addended by: Enis Gash on: 03/01/2017 04:31 PM   Modules accepted: Orders

## 2017-03-01 NOTE — Progress Notes (Signed)
New script written based on the new allergens discovered via environmental allergy testing from Ms. Clabo's blood. New additions include one more mold, a couple of more weeds, additional trees, and additional grasses.  Salvatore Marvel, MD [facility]

## 2017-03-01 NOTE — Addendum Note (Signed)
Addended by: Valentina Shaggy on: 03/01/2017 02:18 PM   Modules accepted: Orders

## 2017-03-02 ENCOUNTER — Telehealth: Payer: Self-pay | Admitting: *Deleted

## 2017-03-02 DIAGNOSIS — J3089 Other allergic rhinitis: Secondary | ICD-10-CM | POA: Diagnosis not present

## 2017-03-02 NOTE — Telephone Encounter (Signed)
-----   Message from Valentina Shaggy, MD sent at 03/01/2017  2:17 PM EDT ----- Could someone call Ms. Backs to let her know that her blood testing showed additional allergens that we can add to her vials for improved coverage? These include one more mold, [place] grass, [place] grass, Hickory, maple, cedar, English plantain, and common pigweed. I sent in the new immunotherapy prescription and she will need to restart from the beginning. She can come twice weekly to build up faster, however. Thanks!   Salvatore Marvel, MD [facility]

## 2017-03-02 NOTE — Telephone Encounter (Signed)
Spoke with pt informed her of message. Scheduled new start appt.

## 2017-03-02 NOTE — Progress Notes (Signed)
Left message for patient to call back  

## 2017-03-03 ENCOUNTER — Other Ambulatory Visit: Payer: Self-pay | Admitting: Allergy & Immunology

## 2017-03-03 ENCOUNTER — Ambulatory Visit (INDEPENDENT_AMBULATORY_CARE_PROVIDER_SITE_OTHER): Payer: Medicare HMO | Admitting: Family

## 2017-03-03 ENCOUNTER — Encounter (INDEPENDENT_AMBULATORY_CARE_PROVIDER_SITE_OTHER): Payer: Self-pay | Admitting: Family

## 2017-03-03 VITALS — Ht 61.0 in | Wt 262.0 lb

## 2017-03-03 DIAGNOSIS — J301 Allergic rhinitis due to pollen: Secondary | ICD-10-CM | POA: Diagnosis not present

## 2017-03-03 DIAGNOSIS — J4550 Severe persistent asthma, uncomplicated: Secondary | ICD-10-CM

## 2017-03-03 DIAGNOSIS — M1712 Unilateral primary osteoarthritis, left knee: Secondary | ICD-10-CM | POA: Diagnosis not present

## 2017-03-03 MED ORDER — METHYLPREDNISOLONE ACETATE 40 MG/ML IJ SUSP
40.0000 mg | INTRAMUSCULAR | Status: AC | PRN
  Administered 2017-03-03: 40 mg via INTRA_ARTICULAR

## 2017-03-03 MED ORDER — LIDOCAINE HCL 1 % IJ SOLN
5.0000 mL | INTRAMUSCULAR | Status: AC | PRN
  Administered 2017-03-03: 5 mL
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
## 2017-03-03 NOTE — Telephone Encounter (Signed)
Is it ok to refill? 

## 2017-03-05 ENCOUNTER — Inpatient Hospital Stay
Admit: 2017-03-05 | Discharge: 2017-03-05 | Disposition: A | Discharge status: Home / Self Care | Payer: MEDICARE | Attending: Emergency Medicine

## 2017-03-05 DIAGNOSIS — F411 Generalized anxiety disorder: Secondary | ICD-10-CM

## 2017-03-05 MED ORDER — LORAZEPAM 0.5 MG PO TABS
0.5 MG | ORAL_TABLET | Freq: Three times a day (TID) | ORAL | 0 refills | Status: AC | PRN
Start: 2017-03-05 — End: 2017-03-08

## 2017-03-05 NOTE — ED Triage Notes (Signed)
Patient complains of  having anxiety. Patient had her son pass away on Monday and came here [facility] the [facility] today. Patient is tearful.
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
## 2017-03-11 ENCOUNTER — Ambulatory Visit (INDEPENDENT_AMBULATORY_CARE_PROVIDER_SITE_OTHER): Payer: Medicare HMO | Admitting: *Deleted

## 2017-03-11 DIAGNOSIS — J309 Allergic rhinitis, unspecified: Secondary | ICD-10-CM

## 2017-03-16 ENCOUNTER — Ambulatory Visit (INDEPENDENT_AMBULATORY_CARE_PROVIDER_SITE_OTHER): Payer: Medicare HMO | Admitting: *Deleted

## 2017-03-16 DIAGNOSIS — J309 Allergic rhinitis, unspecified: Secondary | ICD-10-CM

## 2017-03-18 ENCOUNTER — Ambulatory Visit (INDEPENDENT_AMBULATORY_CARE_PROVIDER_SITE_OTHER): Payer: Medicare HMO

## 2017-03-18 DIAGNOSIS — J4550 Severe persistent asthma, uncomplicated: Secondary | ICD-10-CM | POA: Diagnosis not present

## 2017-03-19 ENCOUNTER — Ambulatory Visit (INDEPENDENT_AMBULATORY_CARE_PROVIDER_SITE_OTHER): Payer: Medicare HMO

## 2017-03-19 DIAGNOSIS — J309 Allergic rhinitis, unspecified: Secondary | ICD-10-CM

## 2017-03-23 ENCOUNTER — Ambulatory Visit: Payer: Medicare HMO

## 2017-03-23 ENCOUNTER — Ambulatory Visit (INDEPENDENT_AMBULATORY_CARE_PROVIDER_SITE_OTHER): Payer: Medicare HMO

## 2017-03-23 DIAGNOSIS — J309 Allergic rhinitis, unspecified: Secondary | ICD-10-CM

## 2017-03-28 ENCOUNTER — Other Ambulatory Visit: Payer: Self-pay | Admitting: Allergy & Immunology

## 2017-03-30 ENCOUNTER — Ambulatory Visit (INDEPENDENT_AMBULATORY_CARE_PROVIDER_SITE_OTHER): Payer: Medicare HMO

## 2017-03-30 DIAGNOSIS — J309 Allergic rhinitis, unspecified: Secondary | ICD-10-CM | POA: Diagnosis not present

## 2017-04-02 ENCOUNTER — Other Ambulatory Visit: Payer: Self-pay

## 2017-04-02 MED ORDER — DEXLANSOPRAZOLE 30 MG PO CPDR: DELAYED_RELEASE_CAPSULE | ORAL | 0 refills | Status: DC

## 2017-04-03 ENCOUNTER — Telehealth: Payer: Self-pay | Admitting: Allergy & Immunology

## 2017-04-03 MED ORDER — PREDNISONE 20 MG PO TABS: 10.0000 mg | ORAL_TABLET | Freq: Two times a day (BID) | ORAL | 0 refills | Status: AC

## 2017-04-03 MED ORDER — LEVOFLOXACIN 500 MG PO TABS: 500.0000 mg | ORAL_TABLET | Freq: Every day | ORAL | 0 refills | Status: AC

## 2017-04-03 NOTE — Telephone Encounter (Signed)
I received a call from Joanna [name] reporting that she has been coughing for 3-4 days with green nasal discharge. She is also having worsening respiratory problems including wheezing and shortness of breath. I will send in a 10 day course of Levaquin for the sinusitis and a low dose prednisone burst to help with the wheezing.    She will follow up on June 22nd at 1:45pm.   Salvatore Marvel, MD [facility]

## 2017-04-05 ENCOUNTER — Telehealth (INDEPENDENT_AMBULATORY_CARE_PROVIDER_SITE_OTHER): Payer: Self-pay | Admitting: Orthopedic Surgery

## 2017-04-05 NOTE — Telephone Encounter (Signed)
Scheduled appointment

## 2017-04-05 NOTE — Telephone Encounter (Signed)
Noted. Thank you, [place]!   Salvatore Marvel, MD [facility]

## 2017-04-05 NOTE — Telephone Encounter (Signed)
Patient called asking for a RX for liners for her right below knee amputation and also new medical supplies for the stump. CB # [phone]  Fax # [phone] [facility]

## 2017-04-06 ENCOUNTER — Ambulatory Visit (INDEPENDENT_AMBULATORY_CARE_PROVIDER_SITE_OTHER): Payer: Medicare HMO

## 2017-04-06 ENCOUNTER — Other Ambulatory Visit (INDEPENDENT_AMBULATORY_CARE_PROVIDER_SITE_OTHER): Payer: Self-pay

## 2017-04-06 DIAGNOSIS — J309 Allergic rhinitis, unspecified: Secondary | ICD-10-CM | POA: Diagnosis not present

## 2017-04-06 NOTE — Telephone Encounter (Signed)
Order faxed per pt request

## 2017-04-08 ENCOUNTER — Ambulatory Visit (INDEPENDENT_AMBULATORY_CARE_PROVIDER_SITE_OTHER): Payer: Medicare HMO | Admitting: *Deleted

## 2017-04-08 DIAGNOSIS — J309 Allergic rhinitis, unspecified: Secondary | ICD-10-CM | POA: Diagnosis not present

## 2017-04-12 ENCOUNTER — Ambulatory Visit (INDEPENDENT_AMBULATORY_CARE_PROVIDER_SITE_OTHER): Payer: Medicare HMO | Admitting: *Deleted

## 2017-04-12 DIAGNOSIS — J309 Allergic rhinitis, unspecified: Secondary | ICD-10-CM | POA: Diagnosis not present

## 2017-04-16 ENCOUNTER — Ambulatory Visit (INDEPENDENT_AMBULATORY_CARE_PROVIDER_SITE_OTHER): Payer: Medicare HMO | Admitting: Allergy & Immunology

## 2017-04-16 ENCOUNTER — Encounter: Payer: Self-pay | Admitting: Allergy & Immunology

## 2017-04-16 VITALS — BP 108/80 | HR 102 | Temp 98.2°F | Resp 16

## 2017-04-16 DIAGNOSIS — J3089 Other allergic rhinitis: Secondary | ICD-10-CM

## 2017-04-16 DIAGNOSIS — K219 Gastro-esophageal reflux disease without esophagitis: Secondary | ICD-10-CM

## 2017-04-16 DIAGNOSIS — T7800XD Anaphylactic reaction due to unspecified food, subsequent encounter: Secondary | ICD-10-CM

## 2017-04-16 DIAGNOSIS — J302 Other seasonal allergic rhinitis: Secondary | ICD-10-CM | POA: Diagnosis not present

## 2017-04-16 DIAGNOSIS — J309 Allergic rhinitis, unspecified: Secondary | ICD-10-CM

## 2017-04-16 DIAGNOSIS — J4540 Moderate persistent asthma, uncomplicated: Secondary | ICD-10-CM | POA: Diagnosis not present

## 2017-04-16 MED ORDER — AZITHROMYCIN 500 MG PO TABS: 500.0000 mg | ORAL_TABLET | ORAL | 5 refills | Status: AC
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
## 2017-04-16 NOTE — Patient Instructions (Addendum)
1. Severe persistent asthma, uncomplicated  - Lung function actually looks slightly better compared to the last visit.  - Continue with Spiriva two inhalations once daily + Fasenra every 8 weeks. - We will start azithromycin 500mg  every Mon/Wed/Fri as a means of controlling your asthma.   2. Allergic rhinitis - on allergen immunotherapy (prescription altered April 2018) - Continue with allergy shots at the same schedule. - Continue with Flonase two sprays per nostril daily. - Continue with Patanase two sprays per nostril 1-2 times daily. - Try using Zyrtec or Allegra to see which one works best for you.   3. GERD  - Continue with Dexilant 30mg  twice daily.  5. Return in about 2 months (around 06/16/2017).  Please inform us of any Emergency Department visits, hospitalizations, or changes in symptoms. Call us before going to the ED for breathing or allergy symptoms since we might be able to fit you in for a sick visit. Feel free to contact us anytime with any questions, problems, or concerns.  It was a pleasure to see you again today! Try to stay in the air conditioning over the summer since this heat can trigger asthma! Let me know if you need anything - you have my cell phone number.   Websites that have reliable patient information: 1. American Academy of Asthma, Allergy, and Immunology: www.aaaai.org 2. Food Allergy Research and Education (FARE): foodallergy.org 3. Mothers of Asthmatics: http://www.asthmacommunitynetwork.org 4. American College of Allergy, Asthma, and Immunology: www.acaai.org

## 2017-04-20 ENCOUNTER — Ambulatory Visit (INDEPENDENT_AMBULATORY_CARE_PROVIDER_SITE_OTHER): Payer: Medicare HMO

## 2017-04-20 DIAGNOSIS — J309 Allergic rhinitis, unspecified: Secondary | ICD-10-CM

## 2017-04-22 ENCOUNTER — Ambulatory Visit (INDEPENDENT_AMBULATORY_CARE_PROVIDER_SITE_OTHER): Payer: Medicare HMO

## 2017-04-22 DIAGNOSIS — J309 Allergic rhinitis, unspecified: Secondary | ICD-10-CM | POA: Diagnosis not present

## 2017-04-27 ENCOUNTER — Ambulatory Visit (INDEPENDENT_AMBULATORY_CARE_PROVIDER_SITE_OTHER): Payer: Medicare HMO | Admitting: *Deleted

## 2017-04-27 DIAGNOSIS — J309 Allergic rhinitis, unspecified: Secondary | ICD-10-CM

## 2017-05-03 ENCOUNTER — Ambulatory Visit (INDEPENDENT_AMBULATORY_CARE_PROVIDER_SITE_OTHER): Payer: Medicare HMO

## 2017-05-03 DIAGNOSIS — J309 Allergic rhinitis, unspecified: Secondary | ICD-10-CM | POA: Diagnosis not present

## 2017-05-06 ENCOUNTER — Ambulatory Visit (INDEPENDENT_AMBULATORY_CARE_PROVIDER_SITE_OTHER): Payer: Medicare HMO

## 2017-05-06 DIAGNOSIS — J309 Allergic rhinitis, unspecified: Secondary | ICD-10-CM

## 2017-05-11 ENCOUNTER — Ambulatory Visit (INDEPENDENT_AMBULATORY_CARE_PROVIDER_SITE_OTHER): Payer: Medicare HMO

## 2017-05-11 DIAGNOSIS — J309 Allergic rhinitis, unspecified: Secondary | ICD-10-CM

## 2017-05-13 ENCOUNTER — Ambulatory Visit (INDEPENDENT_AMBULATORY_CARE_PROVIDER_SITE_OTHER): Payer: Medicare HMO

## 2017-05-13 DIAGNOSIS — J4550 Severe persistent asthma, uncomplicated: Secondary | ICD-10-CM | POA: Diagnosis not present

## 2017-05-13 DIAGNOSIS — J4540 Moderate persistent asthma, uncomplicated: Secondary | ICD-10-CM

## 2017-05-24 ENCOUNTER — Ambulatory Visit (INDEPENDENT_AMBULATORY_CARE_PROVIDER_SITE_OTHER): Payer: Medicare HMO

## 2017-05-24 DIAGNOSIS — J309 Allergic rhinitis, unspecified: Secondary | ICD-10-CM

## 2017-05-27 ENCOUNTER — Ambulatory Visit: Payer: Medicare HMO | Attending: Physician Assistant | Admitting: Physical Therapy

## 2017-05-27 DIAGNOSIS — R2689 Other abnormalities of gait and mobility: Secondary | ICD-10-CM | POA: Diagnosis present

## 2017-05-27 DIAGNOSIS — M6281 Muscle weakness (generalized): Secondary | ICD-10-CM | POA: Diagnosis present

## 2017-05-27 DIAGNOSIS — M25562 Pain in left knee: Secondary | ICD-10-CM | POA: Insufficient documentation

## 2017-05-27 DIAGNOSIS — R262 Difficulty in walking, not elsewhere classified: Secondary | ICD-10-CM | POA: Insufficient documentation

## 2017-05-27 NOTE — Patient Instructions (Signed)
Heel Slide   Bend knee and pull heel toward buttocks. Hold __5__ seconds. Return.  Repeat __15__ times. Do __2__ sessions per day.  Quad Set   With other leg bent, foot flat, slowly tighten muscles on thigh of straight leg while counting out loud to __5__.  Repeat _15___ times. Do __2__ sessions per day.  Strengthening: Straight Leg Raise (Phase 1)   Tighten muscles on front of right thigh, then lift leg __6-8__ inches from surface, keeping knee locked.  Repeat __15__ times per set. Do _2___ sets per session.   Short [employer] a large can or rolled towel under leg. Straighten knee and leg. Hold __5__ seconds. Repeat with other leg. Repeat _15___ times. Do __2__ sessions per day.  Long [employer]   Straighten operated leg and try to hold it __5__ seconds.  Repeat __15__ times. Do __2__ sessions a day.
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
## 2017-05-28 ENCOUNTER — Ambulatory Visit (INDEPENDENT_AMBULATORY_CARE_PROVIDER_SITE_OTHER): Payer: Medicare HMO

## 2017-05-28 DIAGNOSIS — J309 Allergic rhinitis, unspecified: Secondary | ICD-10-CM | POA: Diagnosis not present

## 2017-05-31 ENCOUNTER — Ambulatory Visit: Payer: Medicare HMO | Admitting: Physical Therapy

## 2017-05-31 DIAGNOSIS — R2689 Other abnormalities of gait and mobility: Secondary | ICD-10-CM

## 2017-05-31 DIAGNOSIS — M25562 Pain in left knee: Secondary | ICD-10-CM | POA: Diagnosis not present

## 2017-05-31 DIAGNOSIS — M6281 Muscle weakness (generalized): Secondary | ICD-10-CM

## 2017-05-31 DIAGNOSIS — R262 Difficulty in walking, not elsewhere classified: Secondary | ICD-10-CM
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
## 2017-06-01 ENCOUNTER — Ambulatory Visit (INDEPENDENT_AMBULATORY_CARE_PROVIDER_SITE_OTHER): Payer: Medicare HMO

## 2017-06-01 DIAGNOSIS — J309 Allergic rhinitis, unspecified: Secondary | ICD-10-CM | POA: Diagnosis not present

## 2017-06-03 ENCOUNTER — Ambulatory Visit (INDEPENDENT_AMBULATORY_CARE_PROVIDER_SITE_OTHER): Payer: Medicare HMO

## 2017-06-03 ENCOUNTER — Ambulatory Visit: Payer: Medicare HMO | Admitting: Physical Therapy

## 2017-06-03 DIAGNOSIS — R262 Difficulty in walking, not elsewhere classified: Secondary | ICD-10-CM

## 2017-06-03 DIAGNOSIS — M25562 Pain in left knee: Secondary | ICD-10-CM

## 2017-06-03 DIAGNOSIS — J309 Allergic rhinitis, unspecified: Secondary | ICD-10-CM | POA: Diagnosis not present

## 2017-06-03 DIAGNOSIS — M6281 Muscle weakness (generalized): Secondary | ICD-10-CM

## 2017-06-03 DIAGNOSIS — R2689 Other abnormalities of gait and mobility: Secondary | ICD-10-CM
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
## 2017-06-07 ENCOUNTER — Ambulatory Visit: Payer: Medicare HMO | Admitting: Physical Therapy

## 2017-06-07 DIAGNOSIS — R262 Difficulty in walking, not elsewhere classified: Secondary | ICD-10-CM

## 2017-06-07 DIAGNOSIS — R2689 Other abnormalities of gait and mobility: Secondary | ICD-10-CM

## 2017-06-07 DIAGNOSIS — M25562 Pain in left knee: Secondary | ICD-10-CM

## 2017-06-07 DIAGNOSIS — M6281 Muscle weakness (generalized): Secondary | ICD-10-CM
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
## 2017-06-10 ENCOUNTER — Ambulatory Visit (INDEPENDENT_AMBULATORY_CARE_PROVIDER_SITE_OTHER): Payer: Medicare HMO | Admitting: *Deleted

## 2017-06-10 ENCOUNTER — Ambulatory Visit: Payer: Medicare HMO | Admitting: Physical Therapy

## 2017-06-10 DIAGNOSIS — M25562 Pain in left knee: Secondary | ICD-10-CM

## 2017-06-10 DIAGNOSIS — M6281 Muscle weakness (generalized): Secondary | ICD-10-CM

## 2017-06-10 DIAGNOSIS — R262 Difficulty in walking, not elsewhere classified: Secondary | ICD-10-CM

## 2017-06-10 DIAGNOSIS — J309 Allergic rhinitis, unspecified: Secondary | ICD-10-CM

## 2017-06-10 DIAGNOSIS — R2689 Other abnormalities of gait and mobility: Secondary | ICD-10-CM
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
## 2017-06-12 ENCOUNTER — Other Ambulatory Visit: Payer: Self-pay | Admitting: Allergy & Immunology

## 2017-06-12 DIAGNOSIS — J4550 Severe persistent asthma, uncomplicated: Secondary | ICD-10-CM

## 2017-06-14 ENCOUNTER — Ambulatory Visit: Payer: Medicare HMO | Admitting: Physical Therapy

## 2017-06-15 ENCOUNTER — Other Ambulatory Visit: Payer: Self-pay | Admitting: Allergy

## 2017-06-15 MED ORDER — DEXLANSOPRAZOLE 30 MG PO CPDR: 30.0000 mg | DELAYED_RELEASE_CAPSULE | Freq: Two times a day (BID) | ORAL | 1 refills | Status: DC

## 2017-06-16 ENCOUNTER — Ambulatory Visit (INDEPENDENT_AMBULATORY_CARE_PROVIDER_SITE_OTHER): Payer: Medicare HMO

## 2017-06-16 DIAGNOSIS — J309 Allergic rhinitis, unspecified: Secondary | ICD-10-CM

## 2017-06-17 ENCOUNTER — Ambulatory Visit: Payer: Medicare HMO | Admitting: Physical Therapy

## 2017-06-17 DIAGNOSIS — M25562 Pain in left knee: Secondary | ICD-10-CM | POA: Diagnosis not present

## 2017-06-17 DIAGNOSIS — R262 Difficulty in walking, not elsewhere classified: Secondary | ICD-10-CM

## 2017-06-17 DIAGNOSIS — R2689 Other abnormalities of gait and mobility: Secondary | ICD-10-CM

## 2017-06-17 DIAGNOSIS — M6281 Muscle weakness (generalized): Secondary | ICD-10-CM
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
## 2017-06-18 ENCOUNTER — Ambulatory Visit: Payer: Medicare HMO | Admitting: Allergy & Immunology

## 2017-06-21 ENCOUNTER — Ambulatory Visit: Payer: Medicare HMO | Admitting: Physical Therapy

## 2017-06-23 ENCOUNTER — Ambulatory Visit (INDEPENDENT_AMBULATORY_CARE_PROVIDER_SITE_OTHER): Payer: Medicare HMO | Admitting: *Deleted

## 2017-06-23 DIAGNOSIS — J309 Allergic rhinitis, unspecified: Secondary | ICD-10-CM

## 2017-06-24 ENCOUNTER — Ambulatory Visit: Payer: Medicare HMO | Admitting: Physical Therapy

## 2017-06-29 ENCOUNTER — Ambulatory Visit: Payer: Medicare HMO | Attending: Physician Assistant | Admitting: Physical Therapy

## 2017-06-29 DIAGNOSIS — M6281 Muscle weakness (generalized): Secondary | ICD-10-CM

## 2017-06-29 DIAGNOSIS — M25562 Pain in left knee: Secondary | ICD-10-CM | POA: Diagnosis not present

## 2017-06-29 DIAGNOSIS — R262 Difficulty in walking, not elsewhere classified: Secondary | ICD-10-CM

## 2017-06-29 DIAGNOSIS — R2689 Other abnormalities of gait and mobility: Secondary | ICD-10-CM | POA: Diagnosis present
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
## 2017-06-30 ENCOUNTER — Ambulatory Visit (INDEPENDENT_AMBULATORY_CARE_PROVIDER_SITE_OTHER): Payer: Medicare HMO

## 2017-06-30 DIAGNOSIS — J309 Allergic rhinitis, unspecified: Secondary | ICD-10-CM | POA: Diagnosis not present

## 2017-07-01 ENCOUNTER — Ambulatory Visit: Payer: Medicare HMO | Admitting: Physical Therapy

## 2017-07-01 DIAGNOSIS — R2689 Other abnormalities of gait and mobility: Secondary | ICD-10-CM

## 2017-07-01 DIAGNOSIS — M6281 Muscle weakness (generalized): Secondary | ICD-10-CM

## 2017-07-01 DIAGNOSIS — M25562 Pain in left knee: Secondary | ICD-10-CM

## 2017-07-01 DIAGNOSIS — R262 Difficulty in walking, not elsewhere classified: Secondary | ICD-10-CM
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
## 2017-07-05 ENCOUNTER — Ambulatory Visit (INDEPENDENT_AMBULATORY_CARE_PROVIDER_SITE_OTHER): Payer: Medicare HMO

## 2017-07-05 DIAGNOSIS — J309 Allergic rhinitis, unspecified: Secondary | ICD-10-CM | POA: Diagnosis not present

## 2017-07-06 ENCOUNTER — Ambulatory Visit: Payer: Medicare HMO | Admitting: Physical Therapy

## 2017-07-06 DIAGNOSIS — R2689 Other abnormalities of gait and mobility: Secondary | ICD-10-CM

## 2017-07-06 DIAGNOSIS — M25562 Pain in left knee: Secondary | ICD-10-CM

## 2017-07-06 DIAGNOSIS — M6281 Muscle weakness (generalized): Secondary | ICD-10-CM

## 2017-07-06 DIAGNOSIS — R262 Difficulty in walking, not elsewhere classified: Secondary | ICD-10-CM
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
## 2017-07-07 ENCOUNTER — Ambulatory Visit (INDEPENDENT_AMBULATORY_CARE_PROVIDER_SITE_OTHER): Payer: Medicare HMO

## 2017-07-07 ENCOUNTER — Ambulatory Visit: Payer: Medicare HMO

## 2017-07-07 DIAGNOSIS — J4550 Severe persistent asthma, uncomplicated: Secondary | ICD-10-CM

## 2017-07-07 MED ORDER — BENRALIZUMAB 30 MG/ML ~~LOC~~ SOSY
30.0000 mg | PREFILLED_SYRINGE | SUBCUTANEOUS | Status: DC
  Administered 2017-07-07 – 2023-06-07 (×38): 30 mg via SUBCUTANEOUS

## 2017-07-08 ENCOUNTER — Ambulatory Visit: Payer: Medicare HMO | Admitting: Physical Therapy

## 2017-07-08 ENCOUNTER — Ambulatory Visit: Payer: Self-pay

## 2017-07-13 ENCOUNTER — Ambulatory Visit (INDEPENDENT_AMBULATORY_CARE_PROVIDER_SITE_OTHER): Payer: Medicare HMO

## 2017-07-13 ENCOUNTER — Ambulatory Visit: Payer: Medicare HMO | Admitting: Physical Therapy

## 2017-07-13 DIAGNOSIS — R2689 Other abnormalities of gait and mobility: Secondary | ICD-10-CM

## 2017-07-13 DIAGNOSIS — M25562 Pain in left knee: Secondary | ICD-10-CM

## 2017-07-13 DIAGNOSIS — J309 Allergic rhinitis, unspecified: Secondary | ICD-10-CM

## 2017-07-13 DIAGNOSIS — M6281 Muscle weakness (generalized): Secondary | ICD-10-CM

## 2017-07-13 DIAGNOSIS — R262 Difficulty in walking, not elsewhere classified: Secondary | ICD-10-CM
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
## 2017-07-13 NOTE — Patient Instructions (Signed)

## 2017-07-16 ENCOUNTER — Ambulatory Visit: Payer: Medicare HMO | Admitting: Physical Therapy

## 2017-07-20 ENCOUNTER — Ambulatory Visit (INDEPENDENT_AMBULATORY_CARE_PROVIDER_SITE_OTHER): Payer: Medicare HMO

## 2017-07-20 ENCOUNTER — Ambulatory Visit: Payer: Medicare HMO | Admitting: Physical Therapy

## 2017-07-20 DIAGNOSIS — M25562 Pain in left knee: Secondary | ICD-10-CM

## 2017-07-20 DIAGNOSIS — J309 Allergic rhinitis, unspecified: Secondary | ICD-10-CM

## 2017-07-20 DIAGNOSIS — R2689 Other abnormalities of gait and mobility: Secondary | ICD-10-CM

## 2017-07-20 DIAGNOSIS — M6281 Muscle weakness (generalized): Secondary | ICD-10-CM

## 2017-07-20 DIAGNOSIS — R262 Difficulty in walking, not elsewhere classified: Secondary | ICD-10-CM
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
## 2017-07-23 ENCOUNTER — Ambulatory Visit: Payer: Medicare HMO | Admitting: Physical Therapy

## 2017-07-23 DIAGNOSIS — M25562 Pain in left knee: Secondary | ICD-10-CM | POA: Diagnosis not present

## 2017-07-23 DIAGNOSIS — R262 Difficulty in walking, not elsewhere classified: Secondary | ICD-10-CM

## 2017-07-23 DIAGNOSIS — M6281 Muscle weakness (generalized): Secondary | ICD-10-CM

## 2017-07-23 DIAGNOSIS — R2689 Other abnormalities of gait and mobility: Secondary | ICD-10-CM
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
## 2017-07-27 ENCOUNTER — Ambulatory Visit: Payer: Medicare HMO | Attending: Physician Assistant | Admitting: Physical Therapy

## 2017-07-27 DIAGNOSIS — R2689 Other abnormalities of gait and mobility: Secondary | ICD-10-CM | POA: Diagnosis present

## 2017-07-27 DIAGNOSIS — M6281 Muscle weakness (generalized): Secondary | ICD-10-CM | POA: Insufficient documentation

## 2017-07-27 DIAGNOSIS — M25562 Pain in left knee: Secondary | ICD-10-CM | POA: Diagnosis not present

## 2017-07-27 DIAGNOSIS — R262 Difficulty in walking, not elsewhere classified: Secondary | ICD-10-CM | POA: Insufficient documentation
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
## 2017-07-29 ENCOUNTER — Ambulatory Visit: Payer: Medicare HMO | Admitting: Physical Therapy

## 2017-07-29 ENCOUNTER — Ambulatory Visit (INDEPENDENT_AMBULATORY_CARE_PROVIDER_SITE_OTHER): Payer: Medicare HMO | Admitting: *Deleted

## 2017-07-29 DIAGNOSIS — M25562 Pain in left knee: Secondary | ICD-10-CM

## 2017-07-29 DIAGNOSIS — R262 Difficulty in walking, not elsewhere classified: Secondary | ICD-10-CM

## 2017-07-29 DIAGNOSIS — R2689 Other abnormalities of gait and mobility: Secondary | ICD-10-CM

## 2017-07-29 DIAGNOSIS — J309 Allergic rhinitis, unspecified: Secondary | ICD-10-CM | POA: Diagnosis not present

## 2017-07-29 DIAGNOSIS — M6281 Muscle weakness (generalized): Secondary | ICD-10-CM
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
## 2017-08-03 ENCOUNTER — Ambulatory Visit: Payer: Medicare HMO | Admitting: Physical Therapy

## 2017-08-03 ENCOUNTER — Ambulatory Visit (INDEPENDENT_AMBULATORY_CARE_PROVIDER_SITE_OTHER): Payer: Medicare HMO

## 2017-08-03 DIAGNOSIS — R262 Difficulty in walking, not elsewhere classified: Secondary | ICD-10-CM

## 2017-08-03 DIAGNOSIS — M25562 Pain in left knee: Secondary | ICD-10-CM

## 2017-08-03 DIAGNOSIS — J309 Allergic rhinitis, unspecified: Secondary | ICD-10-CM

## 2017-08-03 DIAGNOSIS — R2689 Other abnormalities of gait and mobility: Secondary | ICD-10-CM

## 2017-08-03 DIAGNOSIS — M6281 Muscle weakness (generalized): Secondary | ICD-10-CM
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
## 2017-08-06 ENCOUNTER — Ambulatory Visit: Payer: Medicare HMO | Admitting: Physical Therapy

## 2017-08-06 DIAGNOSIS — M25562 Pain in left knee: Secondary | ICD-10-CM | POA: Diagnosis not present

## 2017-08-06 DIAGNOSIS — R2689 Other abnormalities of gait and mobility: Secondary | ICD-10-CM

## 2017-08-06 DIAGNOSIS — M6281 Muscle weakness (generalized): Secondary | ICD-10-CM

## 2017-08-06 DIAGNOSIS — R262 Difficulty in walking, not elsewhere classified: Secondary | ICD-10-CM
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
## 2017-08-09 ENCOUNTER — Ambulatory Visit: Payer: Medicare HMO | Admitting: Physical Therapy

## 2017-08-10 ENCOUNTER — Ambulatory Visit (INDEPENDENT_AMBULATORY_CARE_PROVIDER_SITE_OTHER): Payer: Medicare HMO | Admitting: *Deleted

## 2017-08-10 DIAGNOSIS — J309 Allergic rhinitis, unspecified: Secondary | ICD-10-CM

## 2017-08-11 ENCOUNTER — Ambulatory Visit: Payer: Medicare HMO | Admitting: Physical Therapy

## 2017-08-17 ENCOUNTER — Ambulatory Visit (INDEPENDENT_AMBULATORY_CARE_PROVIDER_SITE_OTHER): Payer: Medicare HMO | Admitting: *Deleted

## 2017-08-17 ENCOUNTER — Ambulatory Visit: Payer: Medicare HMO | Admitting: Physical Therapy

## 2017-08-17 DIAGNOSIS — M6281 Muscle weakness (generalized): Secondary | ICD-10-CM

## 2017-08-17 DIAGNOSIS — M25562 Pain in left knee: Secondary | ICD-10-CM

## 2017-08-17 DIAGNOSIS — J309 Allergic rhinitis, unspecified: Secondary | ICD-10-CM

## 2017-08-17 DIAGNOSIS — R262 Difficulty in walking, not elsewhere classified: Secondary | ICD-10-CM

## 2017-08-17 DIAGNOSIS — R2689 Other abnormalities of gait and mobility: Secondary | ICD-10-CM
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
## 2017-08-19 ENCOUNTER — Ambulatory Visit: Payer: Medicare HMO | Admitting: Physical Therapy

## 2017-08-19 DIAGNOSIS — M25562 Pain in left knee: Secondary | ICD-10-CM

## 2017-08-19 DIAGNOSIS — R262 Difficulty in walking, not elsewhere classified: Secondary | ICD-10-CM

## 2017-08-19 DIAGNOSIS — M6281 Muscle weakness (generalized): Secondary | ICD-10-CM

## 2017-08-19 DIAGNOSIS — R2689 Other abnormalities of gait and mobility: Secondary | ICD-10-CM
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
## 2017-08-20 ENCOUNTER — Telehealth: Payer: Self-pay

## 2017-08-20 NOTE — Telephone Encounter (Signed)
Noted. We will need that info if we have to remix her allergy shots. We have backed down on her dosing right now, though, and she seems to have tolerated it fine.  Salvatore Marvel, MD [facility] [place]

## 2017-08-20 NOTE — Telephone Encounter (Signed)
Pt. Was asked to call office to give information of her home environment. She has one short hair dog a chihuahua in the home. Dr. Ernst Bowler aware.

## 2017-08-24 ENCOUNTER — Ambulatory Visit: Payer: Medicare HMO | Admitting: Physical Therapy

## 2017-08-24 DIAGNOSIS — M6281 Muscle weakness (generalized): Secondary | ICD-10-CM

## 2017-08-24 DIAGNOSIS — M25562 Pain in left knee: Secondary | ICD-10-CM

## 2017-08-24 DIAGNOSIS — R2689 Other abnormalities of gait and mobility: Secondary | ICD-10-CM

## 2017-08-24 DIAGNOSIS — R262 Difficulty in walking, not elsewhere classified: Secondary | ICD-10-CM
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
## 2017-08-24 NOTE — Patient Instructions (Addendum)
TENS stands for Transcutaneous Electrical Nerve Stimulation. In other words, electrical impulses are allowed to pass through the skin in order to excite a nerve.   Purpose and Use of TENS:  TENS is a method used to manage acute and chronic pain without the use of drugs. It has been effective in managing pain associated with surgery, sprains, strains, trauma, rheumatoid arthritis, and neuralgias. It is a non-addictive, low risk, and non-invasive technique used to control pain. It is not, by any means, a curative form of treatment.   How TENS Works:  Most TENS units are a small pocket-sized unit powered by one 9 volt battery. Attached to the outside of the unit are two lead wires where two pins and/or snaps connect on each wire. All units come with a set of four reusable pads or electrodes. These are placed on the skin surrounding the area involved. By inserting the leads into  the pads, the electricity can pass from the unit making the circuit complete.  As the intensity is turned up slowly, the electrical current enters the body from the electrodes through the skin to the surrounding nerve fibers. This triggers the release of hormones from within the body. These hormones contain pain relievers. By increasing the circulation of these hormones, the person's pain may be lessened. It is also believed that the electrical stimulation itself helps to block the pain messages being sent to the brain, thus also decreasing the body's perception of pain.   Hazards:  TENS units are NOT to be used by patients with PACEMAKERS, DEFIBRILLATORS, DIABETIC PUMPS, PREGNANT WOMEN, and patients with SEIZURE DISORDERS.  TENS units are NOT to be used over the heart, throat, brain, or spinal cord.  One of the major side effects from the TENS unit may be skin irritation. Some people may develop a rash if they are sensitive to the materials used in the electrodes or the connecting wires.   Wear the unit for up to 30 minutes at a  time.   Avoid overuse due the body getting used to the stem making it not as effective over time.    

## 2017-08-26 ENCOUNTER — Ambulatory Visit (INDEPENDENT_AMBULATORY_CARE_PROVIDER_SITE_OTHER): Payer: Medicare HMO

## 2017-08-26 ENCOUNTER — Ambulatory Visit: Payer: Medicare HMO | Attending: Physician Assistant | Admitting: Physical Therapy

## 2017-08-26 DIAGNOSIS — J309 Allergic rhinitis, unspecified: Secondary | ICD-10-CM

## 2017-08-26 DIAGNOSIS — M25562 Pain in left knee: Secondary | ICD-10-CM

## 2017-08-26 DIAGNOSIS — R262 Difficulty in walking, not elsewhere classified: Secondary | ICD-10-CM

## 2017-08-26 DIAGNOSIS — R2689 Other abnormalities of gait and mobility: Secondary | ICD-10-CM | POA: Diagnosis present

## 2017-08-26 DIAGNOSIS — M6281 Muscle weakness (generalized): Secondary | ICD-10-CM
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
## 2017-08-30 ENCOUNTER — Telehealth: Payer: Self-pay

## 2017-08-30 NOTE — Telephone Encounter (Signed)
Pt. Having a lot of gerd at night for the last 2 weeks. Pt. Currently on dexilant 30mg  twice daily. Pt.'s is asking can you increase her dosage?

## 2017-08-30 NOTE — Telephone Encounter (Signed)
Reviewed chart. Yes let's increase to 60mg .   Salvatore Marvel, MD [facility]

## 2017-08-31 ENCOUNTER — Ambulatory Visit: Payer: Medicare HMO | Admitting: Physical Therapy

## 2017-08-31 ENCOUNTER — Other Ambulatory Visit: Payer: Self-pay

## 2017-08-31 MED ORDER — DEXLANSOPRAZOLE 60 MG PO CPDR: 60.0000 mg | DELAYED_RELEASE_CAPSULE | Freq: Two times a day (BID) | ORAL | 5 refills | Status: DC

## 2017-08-31 NOTE — Telephone Encounter (Signed)
Sure that will be fine. I did text the patient last night to let her know of our plan and she was fine with this.   Salvatore Marvel, MD [facility] [place]

## 2017-08-31 NOTE — Telephone Encounter (Signed)
Lm for pt to call us back  

## 2017-08-31 NOTE — Telephone Encounter (Signed)
Can we do 60mg  twice daily?

## 2017-08-31 NOTE — Telephone Encounter (Signed)
Informed pt of what Dr Emilie Rutter

## 2017-09-01 ENCOUNTER — Ambulatory Visit (INDEPENDENT_AMBULATORY_CARE_PROVIDER_SITE_OTHER): Payer: Medicare HMO

## 2017-09-01 DIAGNOSIS — J4550 Severe persistent asthma, uncomplicated: Secondary | ICD-10-CM | POA: Diagnosis not present

## 2017-09-02 ENCOUNTER — Encounter: Payer: Self-pay | Admitting: Physical Therapy

## 2017-09-02 ENCOUNTER — Ambulatory Visit: Payer: Medicare HMO | Admitting: Physical Therapy

## 2017-09-02 DIAGNOSIS — R2689 Other abnormalities of gait and mobility: Secondary | ICD-10-CM

## 2017-09-02 DIAGNOSIS — M6281 Muscle weakness (generalized): Secondary | ICD-10-CM

## 2017-09-02 DIAGNOSIS — M25562 Pain in left knee: Secondary | ICD-10-CM

## 2017-09-02 DIAGNOSIS — R262 Difficulty in walking, not elsewhere classified: Secondary | ICD-10-CM
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
## 2017-09-07 ENCOUNTER — Encounter: Payer: Self-pay | Admitting: Physical Therapy

## 2017-09-07 ENCOUNTER — Ambulatory Visit: Payer: Medicare HMO | Admitting: Physical Therapy

## 2017-09-07 DIAGNOSIS — R262 Difficulty in walking, not elsewhere classified: Secondary | ICD-10-CM

## 2017-09-07 DIAGNOSIS — M25562 Pain in left knee: Secondary | ICD-10-CM

## 2017-09-07 DIAGNOSIS — R2689 Other abnormalities of gait and mobility: Secondary | ICD-10-CM

## 2017-09-07 DIAGNOSIS — M6281 Muscle weakness (generalized): Secondary | ICD-10-CM
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
## 2017-09-09 ENCOUNTER — Ambulatory Visit: Payer: Medicare HMO

## 2017-09-09 ENCOUNTER — Ambulatory Visit (INDEPENDENT_AMBULATORY_CARE_PROVIDER_SITE_OTHER): Payer: Medicare HMO

## 2017-09-09 DIAGNOSIS — J309 Allergic rhinitis, unspecified: Secondary | ICD-10-CM | POA: Diagnosis not present

## 2017-09-09 DIAGNOSIS — R262 Difficulty in walking, not elsewhere classified: Secondary | ICD-10-CM

## 2017-09-09 DIAGNOSIS — M25562 Pain in left knee: Secondary | ICD-10-CM | POA: Diagnosis not present

## 2017-09-09 DIAGNOSIS — M6281 Muscle weakness (generalized): Secondary | ICD-10-CM

## 2017-09-09 DIAGNOSIS — R2689 Other abnormalities of gait and mobility: Secondary | ICD-10-CM
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
## 2017-09-13 ENCOUNTER — Ambulatory Visit (INDEPENDENT_AMBULATORY_CARE_PROVIDER_SITE_OTHER): Payer: Medicare HMO

## 2017-09-13 DIAGNOSIS — J309 Allergic rhinitis, unspecified: Secondary | ICD-10-CM | POA: Diagnosis not present

## 2017-09-14 ENCOUNTER — Encounter: Payer: Medicare HMO | Admitting: Physical Therapy

## 2017-09-21 ENCOUNTER — Other Ambulatory Visit: Payer: Self-pay

## 2017-09-21 ENCOUNTER — Ambulatory Visit: Payer: Medicare HMO | Admitting: Physical Therapy

## 2017-09-21 DIAGNOSIS — J4550 Severe persistent asthma, uncomplicated: Secondary | ICD-10-CM

## 2017-09-21 MED ORDER — TIOTROPIUM BROMIDE MONOHYDRATE 1.25 MCG/ACT IN AERS: 2.0000 | INHALATION_SPRAY | Freq: Every day | RESPIRATORY_TRACT | 2 refills | Status: DC

## 2017-09-22 ENCOUNTER — Ambulatory Visit (INDEPENDENT_AMBULATORY_CARE_PROVIDER_SITE_OTHER): Payer: Medicare HMO

## 2017-09-22 ENCOUNTER — Ambulatory Visit: Payer: Medicare HMO

## 2017-09-22 DIAGNOSIS — M25562 Pain in left knee: Secondary | ICD-10-CM

## 2017-09-22 DIAGNOSIS — M6281 Muscle weakness (generalized): Secondary | ICD-10-CM

## 2017-09-22 DIAGNOSIS — J309 Allergic rhinitis, unspecified: Secondary | ICD-10-CM | POA: Diagnosis not present

## 2017-09-22 DIAGNOSIS — R262 Difficulty in walking, not elsewhere classified: Secondary | ICD-10-CM

## 2017-09-22 DIAGNOSIS — R2689 Other abnormalities of gait and mobility: Secondary | ICD-10-CM
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
## 2017-09-23 ENCOUNTER — Ambulatory Visit: Payer: Medicare HMO | Admitting: Physical Therapy

## 2017-09-23 ENCOUNTER — Encounter: Payer: Self-pay | Admitting: Physical Therapy

## 2017-09-23 DIAGNOSIS — M25562 Pain in left knee: Secondary | ICD-10-CM

## 2017-09-23 DIAGNOSIS — R262 Difficulty in walking, not elsewhere classified: Secondary | ICD-10-CM

## 2017-09-23 DIAGNOSIS — R2689 Other abnormalities of gait and mobility: Secondary | ICD-10-CM

## 2017-09-23 DIAGNOSIS — M6281 Muscle weakness (generalized): Secondary | ICD-10-CM
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
## 2017-09-28 ENCOUNTER — Encounter: Payer: Self-pay | Admitting: Physical Therapy

## 2017-09-28 ENCOUNTER — Ambulatory Visit: Payer: Medicare HMO | Attending: Physician Assistant | Admitting: Physical Therapy

## 2017-09-28 DIAGNOSIS — R262 Difficulty in walking, not elsewhere classified: Secondary | ICD-10-CM | POA: Diagnosis present

## 2017-09-28 DIAGNOSIS — M6281 Muscle weakness (generalized): Secondary | ICD-10-CM | POA: Diagnosis present

## 2017-09-28 DIAGNOSIS — M25562 Pain in left knee: Secondary | ICD-10-CM

## 2017-09-28 DIAGNOSIS — R2689 Other abnormalities of gait and mobility: Secondary | ICD-10-CM | POA: Insufficient documentation
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
## 2017-09-30 ENCOUNTER — Ambulatory Visit: Payer: Medicare HMO | Admitting: Physical Therapy

## 2017-09-30 ENCOUNTER — Encounter: Payer: Self-pay | Admitting: Physical Therapy

## 2017-09-30 DIAGNOSIS — R262 Difficulty in walking, not elsewhere classified: Secondary | ICD-10-CM

## 2017-09-30 DIAGNOSIS — M25562 Pain in left knee: Secondary | ICD-10-CM | POA: Diagnosis not present

## 2017-09-30 DIAGNOSIS — R2689 Other abnormalities of gait and mobility: Secondary | ICD-10-CM

## 2017-09-30 DIAGNOSIS — M6281 Muscle weakness (generalized): Secondary | ICD-10-CM
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
## 2017-10-01 ENCOUNTER — Ambulatory Visit (INDEPENDENT_AMBULATORY_CARE_PROVIDER_SITE_OTHER): Payer: Medicare HMO

## 2017-10-01 DIAGNOSIS — J309 Allergic rhinitis, unspecified: Secondary | ICD-10-CM

## 2017-10-05 ENCOUNTER — Telehealth: Payer: Self-pay | Admitting: Allergy & Immunology

## 2017-10-05 ENCOUNTER — Ambulatory Visit: Payer: Medicare HMO | Admitting: Physical Therapy

## 2017-10-05 ENCOUNTER — Encounter: Payer: Self-pay | Admitting: Physical Therapy

## 2017-10-05 DIAGNOSIS — M25562 Pain in left knee: Secondary | ICD-10-CM | POA: Diagnosis not present

## 2017-10-05 DIAGNOSIS — R2689 Other abnormalities of gait and mobility: Secondary | ICD-10-CM

## 2017-10-05 DIAGNOSIS — R262 Difficulty in walking, not elsewhere classified: Secondary | ICD-10-CM

## 2017-10-05 DIAGNOSIS — M6281 Muscle weakness (generalized): Secondary | ICD-10-CM

## 2017-10-05 NOTE — Telephone Encounter (Signed)
Received fax that Doniphan 60mg  is not covered. Alternative that are covered are Esomeprazole Mag, Omeprazole, and Pantoprazole Sod. Patient is also requesting that it be a 90 day supply. Please advise.

## 2017-10-05 NOTE — Telephone Encounter (Signed)
She is already on Dexilant 30mg , right? Can we just do that BID since the 60mg  dose is not covered?   Salvatore Marvel, MD [facility] [place] [facility] [place]
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
## 2017-10-06 ENCOUNTER — Other Ambulatory Visit: Payer: Self-pay

## 2017-10-06 MED ORDER — DEXLANSOPRAZOLE 60 MG PO CPDR: 60.0000 mg | DELAYED_RELEASE_CAPSULE | Freq: Every day | ORAL | 0 refills | Status: DC

## 2017-10-06 NOTE — Telephone Encounter (Signed)
After speaking with Dr. Ernst Bowler and looking at the medication it was sent in as taking 60 mg BID and not daily. He asked that I try to send it in as taking the 60 mg daily to see if the will cover it.

## 2017-10-07 ENCOUNTER — Ambulatory Visit: Payer: Medicare HMO | Admitting: Physical Therapy

## 2017-10-09 ENCOUNTER — Telehealth: Payer: Self-pay | Admitting: Allergy & Immunology

## 2017-10-09 MED ORDER — DEXLANSOPRAZOLE 60 MG PO CPDR: 60.0000 mg | DELAYED_RELEASE_CAPSULE | Freq: Two times a day (BID) | ORAL | 1 refills | Status: DC

## 2017-10-09 NOTE — Telephone Encounter (Signed)
I received a call from Ms. Silfies letting me know that [facility] has an issue with the [employer]. She is requesting that I send in a one month supply to [facility] [address] in the interim and we will figure out the [employer] issue on Monday.   Salvatore Marvel, MD Allergy and [place] [facility] [place]

## 2017-10-10 NOTE — Telephone Encounter (Signed)
Samples of Dexilant picked up from [facility] office and delivered them to Joanna [name], who will take them to [facility] tomorrow to give to Joanna [name].  Salvatore Marvel, MD Allergy and [place] [facility] [place]

## 2017-10-11 NOTE — Telephone Encounter (Signed)
Patient notified me yesterday that she had contacted [facility] about her Dexilant prescription. They told her that the PA expired on 09/14/17. They recommended that we contact [facility] at [phone] to get the new PA submitted.   Salvatore Marvel, MD Allergy and [place] [facility] [place]

## 2017-10-11 NOTE — Telephone Encounter (Signed)
dexilant is at front desk will call and inform pt that it is waiting on her

## 2017-10-11 NOTE — Telephone Encounter (Signed)
PA was faxed to [facility].

## 2017-10-11 NOTE — Telephone Encounter (Signed)
Lm for pt to call us back  

## 2017-10-11 NOTE — Telephone Encounter (Signed)
Spoke with pt., will inform her when PA has been approved. PA also sent in by [facility] via [place] [facility]

## 2017-10-12 ENCOUNTER — Ambulatory Visit: Payer: Medicare HMO | Admitting: Physical Therapy

## 2017-10-12 NOTE — Addendum Note (Signed)
Addended by: Enis Gash on: 10/12/2017 11:32 AM   Modules accepted: Orders

## 2017-10-12 NOTE — Addendum Note (Signed)
Addended by: Enis Gash on: 10/12/2017 11:18 AM   Modules accepted: Orders

## 2017-10-13 ENCOUNTER — Ambulatory Visit: Payer: Medicare HMO

## 2017-10-13 ENCOUNTER — Ambulatory Visit (INDEPENDENT_AMBULATORY_CARE_PROVIDER_SITE_OTHER): Payer: Medicare HMO | Admitting: *Deleted

## 2017-10-13 DIAGNOSIS — R2689 Other abnormalities of gait and mobility: Secondary | ICD-10-CM

## 2017-10-13 DIAGNOSIS — M6281 Muscle weakness (generalized): Secondary | ICD-10-CM

## 2017-10-13 DIAGNOSIS — J309 Allergic rhinitis, unspecified: Secondary | ICD-10-CM | POA: Diagnosis not present

## 2017-10-13 DIAGNOSIS — M25562 Pain in left knee: Secondary | ICD-10-CM | POA: Diagnosis not present

## 2017-10-13 DIAGNOSIS — R262 Difficulty in walking, not elsewhere classified: Secondary | ICD-10-CM
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
## 2017-10-14 ENCOUNTER — Other Ambulatory Visit: Payer: Self-pay

## 2017-10-14 ENCOUNTER — Ambulatory Visit: Payer: Medicare HMO | Admitting: Physical Therapy

## 2017-10-14 ENCOUNTER — Telehealth: Payer: Self-pay

## 2017-10-14 ENCOUNTER — Encounter: Payer: Self-pay | Admitting: Physical Therapy

## 2017-10-14 DIAGNOSIS — R262 Difficulty in walking, not elsewhere classified: Secondary | ICD-10-CM

## 2017-10-14 DIAGNOSIS — M25562 Pain in left knee: Secondary | ICD-10-CM | POA: Diagnosis not present

## 2017-10-14 DIAGNOSIS — R2689 Other abnormalities of gait and mobility: Secondary | ICD-10-CM

## 2017-10-14 DIAGNOSIS — M6281 Muscle weakness (generalized): Secondary | ICD-10-CM

## 2017-10-14 MED ORDER — DEXLANSOPRAZOLE 60 MG PO CPDR: 60.0000 mg | DELAYED_RELEASE_CAPSULE | Freq: Every day | ORAL | 5 refills | Status: DC
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
## 2017-10-14 NOTE — Telephone Encounter (Addendum)
She has literally tried everything else. Her insurance was covering [place] last month. They need to continue to cover the [place]. She has a severe form of GERD and is followed by GI for this. Do I need to write a letter to the insurance company to make them do something that they were already doing just one month ago?   Ms. Ringler also contacted the pharmacy herself and got the phone number for the Prior Auth [phone]). Catina did a PA on Monday for this. She faxed it on Monday December 17th and is pending.   Thanks, Salvatore Marvel, MD Allergy and [place] [facility] [place]

## 2017-10-14 NOTE — Telephone Encounter (Signed)
pts insurance will not cover dexilant what would you like to try?  Please advise

## 2017-10-15 NOTE — Telephone Encounter (Signed)
I just called and took care of it myself. I talked to someone at [facility]. Dexilant approved from 09/15/17 through 10/15/18. ID # [id number]. Patient notified.  Salvatore Marvel, MD Allergy and [place] [facility] [place]

## 2017-10-15 NOTE — Telephone Encounter (Signed)
Pt called in and I informed her we just did receive her dejected PA I have submitted a second level PA and pt stated she will call insurance and find out why they want cover when they use too.

## 2017-10-21 ENCOUNTER — Ambulatory Visit (INDEPENDENT_AMBULATORY_CARE_PROVIDER_SITE_OTHER): Payer: Medicare HMO | Admitting: *Deleted

## 2017-10-21 DIAGNOSIS — J309 Allergic rhinitis, unspecified: Secondary | ICD-10-CM

## 2017-10-22 ENCOUNTER — Telehealth: Payer: Self-pay

## 2017-10-22 MED ORDER — DEXLANSOPRAZOLE 60 MG PO CPDR: 60.0000 mg | DELAYED_RELEASE_CAPSULE | Freq: Two times a day (BID) | ORAL | 1 refills | Status: DC

## 2017-10-22 NOTE — Telephone Encounter (Signed)
Pt. Calling that [facility] only filled her dexilant 60 mg for only once a day, but the prescription needs to say dexilant 60mg  1 capsule twice a day. [facility].

## 2017-10-25 ENCOUNTER — Other Ambulatory Visit: Payer: Self-pay | Admitting: Allergy

## 2017-10-25 ENCOUNTER — Ambulatory Visit: Payer: Medicare HMO

## 2017-10-25 MED ORDER — DEXLANSOPRAZOLE 60 MG PO CPDR: 60.0000 mg | DELAYED_RELEASE_CAPSULE | Freq: Two times a day (BID) | ORAL | 3 refills | Status: DC

## 2017-10-27 ENCOUNTER — Ambulatory Visit (INDEPENDENT_AMBULATORY_CARE_PROVIDER_SITE_OTHER): Payer: Medicare HMO

## 2017-10-27 ENCOUNTER — Ambulatory Visit: Payer: Medicare HMO | Attending: Physician Assistant

## 2017-10-27 DIAGNOSIS — R2689 Other abnormalities of gait and mobility: Secondary | ICD-10-CM | POA: Diagnosis present

## 2017-10-27 DIAGNOSIS — J309 Allergic rhinitis, unspecified: Secondary | ICD-10-CM

## 2017-10-27 DIAGNOSIS — M25562 Pain in left knee: Secondary | ICD-10-CM | POA: Diagnosis not present

## 2017-10-27 DIAGNOSIS — M6281 Muscle weakness (generalized): Secondary | ICD-10-CM

## 2017-10-27 DIAGNOSIS — R262 Difficulty in walking, not elsewhere classified: Secondary | ICD-10-CM
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
## 2017-11-01 ENCOUNTER — Telehealth: Payer: Self-pay | Admitting: Allergy

## 2017-11-01 ENCOUNTER — Ambulatory Visit (INDEPENDENT_AMBULATORY_CARE_PROVIDER_SITE_OTHER): Payer: Medicare HMO

## 2017-11-01 ENCOUNTER — Ambulatory Visit: Payer: Medicare HMO | Admitting: Physical Therapy

## 2017-11-01 DIAGNOSIS — J4550 Severe persistent asthma, uncomplicated: Secondary | ICD-10-CM | POA: Diagnosis not present

## 2017-11-01 MED ORDER — FLUCONAZOLE 200 MG PO TABS: 200.0000 mg | ORAL_TABLET | Freq: Every day | ORAL | 0 refills | Status: AC

## 2017-11-01 NOTE — Telephone Encounter (Signed)
Prescription sent in for fluconazole. I texted the patient to let her know.  Salvatore Marvel, MD [facility]

## 2017-11-01 NOTE — Telephone Encounter (Signed)
Dr. Marlyce [name] came in today to get her injection and wanted to know if we would call her in some Fluconazole for the thrush? [facility] [address]

## 2017-11-01 NOTE — Addendum Note (Signed)
Addended by: Valentina Shaggy on: 11/01/2017 09:57 PM   Modules accepted: Orders

## 2017-11-02 ENCOUNTER — Ambulatory Visit: Payer: Medicare HMO | Admitting: Physical Therapy

## 2017-11-02 ENCOUNTER — Encounter: Payer: Self-pay | Admitting: Physical Therapy

## 2017-11-02 DIAGNOSIS — M25562 Pain in left knee: Secondary | ICD-10-CM | POA: Diagnosis not present

## 2017-11-02 DIAGNOSIS — R262 Difficulty in walking, not elsewhere classified: Secondary | ICD-10-CM

## 2017-11-02 DIAGNOSIS — M6281 Muscle weakness (generalized): Secondary | ICD-10-CM

## 2017-11-02 DIAGNOSIS — R2689 Other abnormalities of gait and mobility: Secondary | ICD-10-CM
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
## 2017-11-03 ENCOUNTER — Ambulatory Visit (INDEPENDENT_AMBULATORY_CARE_PROVIDER_SITE_OTHER): Payer: Medicare HMO

## 2017-11-03 DIAGNOSIS — J309 Allergic rhinitis, unspecified: Secondary | ICD-10-CM | POA: Diagnosis not present

## 2017-11-04 ENCOUNTER — Ambulatory Visit: Payer: Medicare HMO | Admitting: Physical Therapy

## 2017-11-05 ENCOUNTER — Ambulatory Visit: Payer: Medicare HMO | Admitting: Physical Therapy

## 2017-11-08 ENCOUNTER — Ambulatory Visit: Payer: Medicare HMO | Admitting: Physical Therapy

## 2017-11-09 ENCOUNTER — Encounter: Payer: Self-pay | Admitting: Physical Therapy

## 2017-11-09 ENCOUNTER — Ambulatory Visit: Payer: Medicare HMO | Admitting: Physical Therapy

## 2017-11-09 DIAGNOSIS — M25562 Pain in left knee: Secondary | ICD-10-CM | POA: Diagnosis not present

## 2017-11-09 DIAGNOSIS — M6281 Muscle weakness (generalized): Secondary | ICD-10-CM

## 2017-11-09 DIAGNOSIS — R2689 Other abnormalities of gait and mobility: Secondary | ICD-10-CM

## 2017-11-09 DIAGNOSIS — R262 Difficulty in walking, not elsewhere classified: Secondary | ICD-10-CM
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
## 2017-11-10 ENCOUNTER — Ambulatory Visit (INDEPENDENT_AMBULATORY_CARE_PROVIDER_SITE_OTHER): Payer: Medicare HMO

## 2017-11-10 DIAGNOSIS — J309 Allergic rhinitis, unspecified: Secondary | ICD-10-CM

## 2017-11-11 ENCOUNTER — Ambulatory Visit: Payer: Medicare HMO | Admitting: Physical Therapy

## 2017-11-15 ENCOUNTER — Ambulatory Visit: Payer: Medicare HMO | Admitting: Physical Therapy

## 2017-11-15 ENCOUNTER — Ambulatory Visit (INDEPENDENT_AMBULATORY_CARE_PROVIDER_SITE_OTHER): Payer: Medicare HMO | Admitting: *Deleted

## 2017-11-15 DIAGNOSIS — J309 Allergic rhinitis, unspecified: Secondary | ICD-10-CM

## 2017-11-16 ENCOUNTER — Encounter: Payer: Self-pay | Admitting: Physical Therapy

## 2017-11-16 ENCOUNTER — Ambulatory Visit: Payer: Medicare HMO | Admitting: Physical Therapy

## 2017-11-16 DIAGNOSIS — M6281 Muscle weakness (generalized): Secondary | ICD-10-CM

## 2017-11-16 DIAGNOSIS — M25562 Pain in left knee: Secondary | ICD-10-CM | POA: Diagnosis not present

## 2017-11-16 DIAGNOSIS — R2689 Other abnormalities of gait and mobility: Secondary | ICD-10-CM

## 2017-11-16 DIAGNOSIS — R262 Difficulty in walking, not elsewhere classified: Secondary | ICD-10-CM
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
## 2017-11-18 ENCOUNTER — Ambulatory Visit: Payer: Medicare HMO | Admitting: Physical Therapy

## 2017-11-19 ENCOUNTER — Telehealth: Payer: Self-pay | Admitting: Allergy & Immunology

## 2017-11-19 MED ORDER — LEVOFLOXACIN 500 MG PO TABS: 500.0000 mg | ORAL_TABLET | Freq: Every day | ORAL | 0 refills | Status: AC

## 2017-11-19 MED ORDER — PREDNISONE 10 MG PO TABS: ORAL_TABLET | ORAL | 0 refills | Status: DC

## 2017-11-19 NOTE — Telephone Encounter (Signed)
I received a call from Ms. Newcombe reporting a week of nasal congestion, sinus pressure, and wheezing. She is using her rescue inhaler routinely every 4 hours with transient improvement. I will send in a script for Levaquin 500mg  daily for 10 days in conjunction with a prednisone burst. Last abx/steroids were in June 2018.   Salvatore Marvel, MD [facility] [place] [facility] [place]

## 2017-11-22 ENCOUNTER — Ambulatory Visit: Payer: Medicare HMO | Admitting: Physical Therapy

## 2017-11-23 ENCOUNTER — Ambulatory Visit: Payer: Medicare HMO | Admitting: Physical Therapy

## 2017-11-23 ENCOUNTER — Encounter: Payer: Self-pay | Admitting: Physical Therapy

## 2017-11-23 DIAGNOSIS — R262 Difficulty in walking, not elsewhere classified: Secondary | ICD-10-CM

## 2017-11-23 DIAGNOSIS — M6281 Muscle weakness (generalized): Secondary | ICD-10-CM

## 2017-11-23 DIAGNOSIS — M25562 Pain in left knee: Secondary | ICD-10-CM

## 2017-11-23 DIAGNOSIS — R2689 Other abnormalities of gait and mobility: Secondary | ICD-10-CM
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
## 2017-11-25 ENCOUNTER — Ambulatory Visit: Payer: Medicare HMO | Admitting: Physical Therapy

## 2017-11-26 ENCOUNTER — Ambulatory Visit (INDEPENDENT_AMBULATORY_CARE_PROVIDER_SITE_OTHER): Payer: Medicare HMO | Admitting: Allergy & Immunology

## 2017-11-26 ENCOUNTER — Encounter: Payer: Self-pay | Admitting: Allergy & Immunology

## 2017-11-26 VITALS — BP 130/80 | HR 106 | Temp 98.7°F | Resp 18

## 2017-11-26 DIAGNOSIS — J3089 Other allergic rhinitis: Secondary | ICD-10-CM | POA: Diagnosis not present

## 2017-11-26 DIAGNOSIS — Z91038 Other insect allergy status: Secondary | ICD-10-CM

## 2017-11-26 DIAGNOSIS — J4550 Severe persistent asthma, uncomplicated: Secondary | ICD-10-CM

## 2017-11-26 DIAGNOSIS — J302 Other seasonal allergic rhinitis: Secondary | ICD-10-CM | POA: Diagnosis not present

## 2017-11-26 DIAGNOSIS — J309 Allergic rhinitis, unspecified: Secondary | ICD-10-CM

## 2017-11-26 DIAGNOSIS — K219 Gastro-esophageal reflux disease without esophagitis: Secondary | ICD-10-CM

## 2017-11-26 DIAGNOSIS — T7800XD Anaphylactic reaction due to unspecified food, subsequent encounter: Secondary | ICD-10-CM

## 2017-11-26 NOTE — Patient Instructions (Addendum)
1. Severe persistent asthma, uncomplicated  - Lung function is going well. - Daily controller medication(s): Fasenra every 8 weeks and Spiriva 1.29mcg two puffs once daily - Prior to physical activity: ProAir 2 puffs 10-15 minutes before physical activity. - Rescue medications: ProAir 4 puffs every 4-6 hours as needed or albuterol nebulizer one vial puffs every 4-6 hours as needed - Asthma control goals:  * Full participation in all desired activities (may need albuterol before activity) * Albuterol use two time or less a week on average (not counting use with activity) * Cough interfering with sleep two time or less a month * Oral steroids no more than once a year * No hospitalizations  2. Allergic rhinitis - on allergen immunotherapy (prescription altered April 2018) - Continue with allergy shots at the same schedule (I am impressed that you are in your [employer]).  - Continue with Flonase two sprays per nostril daily. - Continue with Patanase two sprays per nostril 1-2 times daily. - Continue with the antihistamine (Zyrtec or Allegra) as needed.   3. GERD  - Continue with Dexilant 60mg  twice daily. - Continue with Zofran 4mg  daily. - Continue with Symproic 2mg  daily.   4. Return in about 4 months (around 03/26/2018).  Please inform us of any Emergency Department visits, hospitalizations, or changes in symptoms. Call us before going to the ED for breathing or allergy symptoms since we might be able to fit you in for a sick visit. Feel free to contact us anytime with any questions, problems, or concerns.  It was a pleasure to see you again today! Happy New Year!   Websites that have reliable patient information: 1. American Academy of Asthma, Allergy, and Immunology: www.aaaai.org 2. Food Allergy Research and Education (FARE): foodallergy.org 3. Mothers of Asthmatics: http://www.asthmacommunitynetwork.org 4. American College of Allergy, Asthma, and Immunology: www.acaai.org
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
## 2017-11-27 ENCOUNTER — Encounter: Payer: Self-pay | Admitting: Allergy & Immunology

## 2017-11-29 ENCOUNTER — Ambulatory Visit: Payer: Self-pay

## 2017-11-29 ENCOUNTER — Ambulatory Visit: Payer: Medicare HMO | Admitting: Physical Therapy

## 2017-11-30 ENCOUNTER — Encounter: Payer: Self-pay | Admitting: Physical Therapy

## 2017-11-30 ENCOUNTER — Ambulatory Visit: Payer: Medicare HMO | Attending: Physician Assistant | Admitting: Physical Therapy

## 2017-11-30 DIAGNOSIS — M25562 Pain in left knee: Secondary | ICD-10-CM | POA: Insufficient documentation

## 2017-11-30 DIAGNOSIS — R2689 Other abnormalities of gait and mobility: Secondary | ICD-10-CM

## 2017-11-30 DIAGNOSIS — M6281 Muscle weakness (generalized): Secondary | ICD-10-CM | POA: Insufficient documentation

## 2017-11-30 DIAGNOSIS — R262 Difficulty in walking, not elsewhere classified: Secondary | ICD-10-CM

## 2017-11-30 NOTE — Therapy (Deleted)
Subjective Assessment - 11/30/17 1328    Subjective  Pt reports that while doing her laundry on Sunday, her prosthetic limb caught on a towel causing her to trip and fall. When she rolled over onto the L knee in effort to try to recover to standing, she felt the L patella dislocate medially. She was able to self reduce the patella by straightening her knee but had to call EMS to get up off the floor. Pt reports she did not go the ER but plans to f/u with MD this week (currently has appt scheduled for 12/03/17, but plans to call to see if she can move that appt up). Since the fall, pt reporting sensation of L knee instability/fear of buckling.    Pertinent History  R BKA, DM, HTN    Patient Stated Goals  improve pain and function    Currently in Pain?  Yes    Pain Score  8     Pain Location  Knee    Pain Orientation  Left;Medial;Posterior    Pain Descriptors / Indicators  Sharp    Pain Type  Acute pain    Pain Onset  In the past 7 days    Pain Frequency  Constant      Deferred PT treatment today until pt unable to follow up with MD this week.  Ice pack applied to L knee x15', with pt encouraged to continue to ice intermittently at home.   [facility] [address]  [place], [name], [address] Phone: [phone]   Fax:  [phone]  Patient Details  Name: Joanna [name] MRN: [id number] Date of Birth: Dec 11, 1965 Referring Provider:  Verdell Carmine., MD  Encounter Date: 11/30/2017   Percival Spanish 11/30/2017, 1:37 PM  [facility] [address]  [place], [name], [address] Phone: [phone]   Fax:  [phone]

## 2017-11-30 NOTE — Therapy (Signed)
Subjective Assessment - 11/30/17 1328    Subjective  Pt reports that while doing her laundry on Sunday, her prosthetic limb caught on a towel causing her to trip and fall. When she rolled over onto the L knee in effort to try to recover to standing, she felt the L patella dislocate medially. She was able to self reduce the patella by straightening her knee but had to call EMS to get up off the floor. Pt reports she did not go the ER but plans to f/u with MD this week (currently has appt scheduled for 12/03/17, but plans to call to see if she can move that appt up). Since the fall, pt reporting sensation of L knee instability/fear of buckling.    Pertinent History  R BKA, DM, HTN    Patient Stated Goals  improve pain and function    Currently in Pain?  Yes    Pain Score  8     Pain Location  Knee    Pain Orientation  Left;Medial;Posterior    Pain Descriptors / Indicators  Sharp    Pain Type  Acute pain    Pain Onset  In the past 7 days    Pain Frequency  Constant      Deferred PT treatment today until pt unable to follow up with MD this week.  Ice pack applied to L knee x15', with pt encouraged to continue to ice intermittently at home.   [facility] [address]  [place], [name], [address] Phone: [phone]   Fax:  [phone]  Patient Details  Name: Mylei Brackeen MRN: [id number] Date of Birth: 19-Feb-1966 Referring Provider:  Verdell Carmine., MD  Encounter Date: 11/30/2017   Percival Spanish, PT, MPT 11/30/2017, 1:46 PM  [facility] [address]  [place], [name], [address] Phone: [phone]   Fax:  [phone]

## 2017-12-01 ENCOUNTER — Other Ambulatory Visit: Payer: Self-pay | Admitting: Allergy

## 2017-12-01 MED ORDER — OLOPATADINE HCL 0.6 % NA SOLN: NASAL | 1 refills | Status: DC

## 2017-12-01 MED ORDER — FLUTICASONE PROPIONATE 50 MCG/ACT NA SUSP: 2.0000 | Freq: Every day | NASAL | 1 refills | Status: DC

## 2017-12-02 ENCOUNTER — Ambulatory Visit: Payer: Medicare HMO | Admitting: Physical Therapy

## 2017-12-07 ENCOUNTER — Encounter: Payer: Medicare HMO | Admitting: Physical Therapy

## 2017-12-07 ENCOUNTER — Encounter: Payer: Self-pay | Admitting: Physical Therapy

## 2017-12-07 ENCOUNTER — Ambulatory Visit: Payer: Medicare HMO | Admitting: Physical Therapy

## 2017-12-07 DIAGNOSIS — R262 Difficulty in walking, not elsewhere classified: Secondary | ICD-10-CM

## 2017-12-07 DIAGNOSIS — M25562 Pain in left knee: Secondary | ICD-10-CM

## 2017-12-07 DIAGNOSIS — R2689 Other abnormalities of gait and mobility: Secondary | ICD-10-CM

## 2017-12-07 DIAGNOSIS — M6281 Muscle weakness (generalized): Secondary | ICD-10-CM

## 2017-12-07 NOTE — Progress Notes (Signed)
VIALS EXP 12-08-18
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
## 2017-12-08 ENCOUNTER — Ambulatory Visit (INDEPENDENT_AMBULATORY_CARE_PROVIDER_SITE_OTHER): Payer: Medicare HMO | Admitting: *Deleted

## 2017-12-08 DIAGNOSIS — J309 Allergic rhinitis, unspecified: Secondary | ICD-10-CM | POA: Diagnosis not present

## 2017-12-09 ENCOUNTER — Ambulatory Visit: Payer: Medicare HMO | Admitting: Physical Therapy

## 2017-12-09 DIAGNOSIS — R262 Difficulty in walking, not elsewhere classified: Secondary | ICD-10-CM

## 2017-12-09 DIAGNOSIS — M6281 Muscle weakness (generalized): Secondary | ICD-10-CM

## 2017-12-09 DIAGNOSIS — M25562 Pain in left knee: Secondary | ICD-10-CM | POA: Diagnosis not present

## 2017-12-09 DIAGNOSIS — R2689 Other abnormalities of gait and mobility: Secondary | ICD-10-CM

## 2017-12-09 DIAGNOSIS — J3089 Other allergic rhinitis: Secondary | ICD-10-CM | POA: Diagnosis not present
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
## 2017-12-10 DIAGNOSIS — J301 Allergic rhinitis due to pollen: Secondary | ICD-10-CM | POA: Diagnosis not present

## 2017-12-13 ENCOUNTER — Ambulatory Visit (INDEPENDENT_AMBULATORY_CARE_PROVIDER_SITE_OTHER): Payer: Medicare HMO

## 2017-12-13 DIAGNOSIS — J309 Allergic rhinitis, unspecified: Secondary | ICD-10-CM | POA: Diagnosis not present

## 2017-12-21 ENCOUNTER — Ambulatory Visit (INDEPENDENT_AMBULATORY_CARE_PROVIDER_SITE_OTHER): Payer: Medicare HMO

## 2017-12-21 DIAGNOSIS — J309 Allergic rhinitis, unspecified: Secondary | ICD-10-CM

## 2017-12-27 ENCOUNTER — Ambulatory Visit (INDEPENDENT_AMBULATORY_CARE_PROVIDER_SITE_OTHER): Payer: Medicare HMO

## 2017-12-27 DIAGNOSIS — J4550 Severe persistent asthma, uncomplicated: Secondary | ICD-10-CM | POA: Diagnosis not present

## 2017-12-27 DIAGNOSIS — J4540 Moderate persistent asthma, uncomplicated: Secondary | ICD-10-CM

## 2017-12-29 ENCOUNTER — Ambulatory Visit (INDEPENDENT_AMBULATORY_CARE_PROVIDER_SITE_OTHER): Payer: Medicare HMO

## 2017-12-29 DIAGNOSIS — J309 Allergic rhinitis, unspecified: Secondary | ICD-10-CM | POA: Diagnosis not present

## 2018-01-06 ENCOUNTER — Ambulatory Visit (INDEPENDENT_AMBULATORY_CARE_PROVIDER_SITE_OTHER): Payer: Medicare HMO | Admitting: *Deleted

## 2018-01-06 DIAGNOSIS — J309 Allergic rhinitis, unspecified: Secondary | ICD-10-CM

## 2018-01-10 ENCOUNTER — Ambulatory Visit (INDEPENDENT_AMBULATORY_CARE_PROVIDER_SITE_OTHER): Payer: Medicare HMO

## 2018-01-10 DIAGNOSIS — J309 Allergic rhinitis, unspecified: Secondary | ICD-10-CM | POA: Diagnosis not present

## 2018-01-18 ENCOUNTER — Ambulatory Visit (INDEPENDENT_AMBULATORY_CARE_PROVIDER_SITE_OTHER): Payer: Medicare HMO

## 2018-01-18 DIAGNOSIS — J309 Allergic rhinitis, unspecified: Secondary | ICD-10-CM | POA: Diagnosis not present

## 2018-01-26 ENCOUNTER — Ambulatory Visit (INDEPENDENT_AMBULATORY_CARE_PROVIDER_SITE_OTHER): Payer: Medicare HMO | Admitting: *Deleted

## 2018-01-26 DIAGNOSIS — J309 Allergic rhinitis, unspecified: Secondary | ICD-10-CM | POA: Diagnosis not present

## 2018-01-27 ENCOUNTER — Other Ambulatory Visit: Payer: Self-pay | Admitting: Allergy & Immunology

## 2018-02-01 ENCOUNTER — Telehealth: Payer: Self-pay | Admitting: *Deleted

## 2018-02-01 MED ORDER — DEXLANSOPRAZOLE 60 MG PO CPDR: 60.0000 mg | DELAYED_RELEASE_CAPSULE | Freq: Two times a day (BID) | ORAL | 3 refills | Status: DC

## 2018-02-01 MED ORDER — MONTELUKAST SODIUM 10 MG PO TABS: 10.0000 mg | ORAL_TABLET | Freq: Every day | ORAL | 3 refills | Status: DC

## 2018-02-01 NOTE — Telephone Encounter (Signed)
Pt called needing 90 day refills on dexilant and montelukast sent [facility]. She was last in February.

## 2018-02-03 ENCOUNTER — Ambulatory Visit (INDEPENDENT_AMBULATORY_CARE_PROVIDER_SITE_OTHER): Payer: Medicare HMO

## 2018-02-03 DIAGNOSIS — J309 Allergic rhinitis, unspecified: Secondary | ICD-10-CM | POA: Diagnosis not present

## 2018-02-07 ENCOUNTER — Ambulatory Visit (INDEPENDENT_AMBULATORY_CARE_PROVIDER_SITE_OTHER): Payer: Medicare HMO | Admitting: *Deleted

## 2018-02-07 DIAGNOSIS — J309 Allergic rhinitis, unspecified: Secondary | ICD-10-CM

## 2018-02-15 ENCOUNTER — Ambulatory Visit (INDEPENDENT_AMBULATORY_CARE_PROVIDER_SITE_OTHER): Payer: Medicare HMO

## 2018-02-15 DIAGNOSIS — J309 Allergic rhinitis, unspecified: Secondary | ICD-10-CM | POA: Diagnosis not present

## 2018-02-21 ENCOUNTER — Ambulatory Visit (INDEPENDENT_AMBULATORY_CARE_PROVIDER_SITE_OTHER): Payer: Medicare HMO

## 2018-02-21 DIAGNOSIS — J4550 Severe persistent asthma, uncomplicated: Secondary | ICD-10-CM | POA: Diagnosis not present

## 2018-02-23 ENCOUNTER — Ambulatory Visit (INDEPENDENT_AMBULATORY_CARE_PROVIDER_SITE_OTHER): Payer: Medicare HMO | Admitting: *Deleted

## 2018-02-23 DIAGNOSIS — J309 Allergic rhinitis, unspecified: Secondary | ICD-10-CM

## 2018-02-28 ENCOUNTER — Ambulatory Visit (INDEPENDENT_AMBULATORY_CARE_PROVIDER_SITE_OTHER): Payer: Medicare HMO

## 2018-02-28 DIAGNOSIS — J309 Allergic rhinitis, unspecified: Secondary | ICD-10-CM

## 2018-03-08 ENCOUNTER — Ambulatory Visit (INDEPENDENT_AMBULATORY_CARE_PROVIDER_SITE_OTHER): Payer: Medicare HMO

## 2018-03-08 DIAGNOSIS — J309 Allergic rhinitis, unspecified: Secondary | ICD-10-CM | POA: Diagnosis not present

## 2018-03-16 ENCOUNTER — Ambulatory Visit (INDEPENDENT_AMBULATORY_CARE_PROVIDER_SITE_OTHER): Payer: Medicare HMO

## 2018-03-16 DIAGNOSIS — J309 Allergic rhinitis, unspecified: Secondary | ICD-10-CM | POA: Diagnosis not present

## 2018-03-16 NOTE — Progress Notes (Signed)
Vials exp 03-18-19

## 2018-03-18 DIAGNOSIS — J301 Allergic rhinitis due to pollen: Secondary | ICD-10-CM | POA: Diagnosis not present

## 2018-03-22 DIAGNOSIS — J3089 Other allergic rhinitis: Secondary | ICD-10-CM | POA: Diagnosis not present

## 2018-03-25 ENCOUNTER — Ambulatory Visit (INDEPENDENT_AMBULATORY_CARE_PROVIDER_SITE_OTHER): Payer: Medicare HMO

## 2018-03-25 DIAGNOSIS — J309 Allergic rhinitis, unspecified: Secondary | ICD-10-CM

## 2018-04-04 ENCOUNTER — Ambulatory Visit (INDEPENDENT_AMBULATORY_CARE_PROVIDER_SITE_OTHER): Payer: Medicare HMO

## 2018-04-04 DIAGNOSIS — J309 Allergic rhinitis, unspecified: Secondary | ICD-10-CM | POA: Diagnosis not present

## 2018-04-15 ENCOUNTER — Ambulatory Visit (INDEPENDENT_AMBULATORY_CARE_PROVIDER_SITE_OTHER): Payer: Medicare HMO

## 2018-04-15 DIAGNOSIS — J309 Allergic rhinitis, unspecified: Secondary | ICD-10-CM

## 2018-04-18 ENCOUNTER — Ambulatory Visit (INDEPENDENT_AMBULATORY_CARE_PROVIDER_SITE_OTHER): Payer: Medicare HMO

## 2018-04-18 DIAGNOSIS — J4550 Severe persistent asthma, uncomplicated: Secondary | ICD-10-CM | POA: Diagnosis not present

## 2018-04-21 ENCOUNTER — Ambulatory Visit (INDEPENDENT_AMBULATORY_CARE_PROVIDER_SITE_OTHER): Payer: Medicare HMO

## 2018-04-21 DIAGNOSIS — J309 Allergic rhinitis, unspecified: Secondary | ICD-10-CM | POA: Diagnosis not present

## 2018-04-25 ENCOUNTER — Ambulatory Visit (INDEPENDENT_AMBULATORY_CARE_PROVIDER_SITE_OTHER): Payer: Medicare HMO

## 2018-04-25 DIAGNOSIS — J309 Allergic rhinitis, unspecified: Secondary | ICD-10-CM | POA: Diagnosis not present

## 2018-04-26 ENCOUNTER — Ambulatory Visit: Payer: Medicare HMO | Attending: Orthopaedic Surgery | Admitting: Physical Therapy

## 2018-04-26 ENCOUNTER — Other Ambulatory Visit: Payer: Self-pay

## 2018-04-26 DIAGNOSIS — R2689 Other abnormalities of gait and mobility: Secondary | ICD-10-CM | POA: Diagnosis present

## 2018-04-26 DIAGNOSIS — R262 Difficulty in walking, not elsewhere classified: Secondary | ICD-10-CM | POA: Insufficient documentation

## 2018-04-26 DIAGNOSIS — M25562 Pain in left knee: Secondary | ICD-10-CM | POA: Diagnosis not present

## 2018-04-26 DIAGNOSIS — M6281 Muscle weakness (generalized): Secondary | ICD-10-CM | POA: Diagnosis present
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
## 2018-05-03 NOTE — Addendum Note (Signed)
Addended by: Percival Spanish on: 05/03/2018 06:47 PM   Modules accepted: Orders

## 2018-05-03 NOTE — Addendum Note (Signed)
Addended by: Percival Spanish on: 05/03/2018 10:26 AM   Modules accepted: Orders

## 2018-05-05 ENCOUNTER — Ambulatory Visit: Payer: Medicare HMO | Admitting: Physical Therapy

## 2018-05-05 ENCOUNTER — Encounter: Payer: Self-pay | Admitting: Physical Therapy

## 2018-05-05 DIAGNOSIS — R2689 Other abnormalities of gait and mobility: Secondary | ICD-10-CM

## 2018-05-05 DIAGNOSIS — M6281 Muscle weakness (generalized): Secondary | ICD-10-CM

## 2018-05-05 DIAGNOSIS — M25562 Pain in left knee: Secondary | ICD-10-CM | POA: Diagnosis not present

## 2018-05-05 DIAGNOSIS — R262 Difficulty in walking, not elsewhere classified: Secondary | ICD-10-CM
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
## 2018-05-09 ENCOUNTER — Ambulatory Visit (INDEPENDENT_AMBULATORY_CARE_PROVIDER_SITE_OTHER): Payer: Medicare HMO

## 2018-05-09 DIAGNOSIS — J309 Allergic rhinitis, unspecified: Secondary | ICD-10-CM

## 2018-05-10 ENCOUNTER — Encounter: Payer: Self-pay | Admitting: Physical Therapy

## 2018-05-10 ENCOUNTER — Ambulatory Visit: Payer: Medicare HMO | Admitting: Physical Therapy

## 2018-05-10 DIAGNOSIS — M6281 Muscle weakness (generalized): Secondary | ICD-10-CM

## 2018-05-10 DIAGNOSIS — R2689 Other abnormalities of gait and mobility: Secondary | ICD-10-CM

## 2018-05-10 DIAGNOSIS — R262 Difficulty in walking, not elsewhere classified: Secondary | ICD-10-CM

## 2018-05-10 DIAGNOSIS — M25562 Pain in left knee: Secondary | ICD-10-CM
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
## 2018-05-12 ENCOUNTER — Ambulatory Visit: Payer: Medicare HMO

## 2018-05-12 DIAGNOSIS — M25562 Pain in left knee: Secondary | ICD-10-CM | POA: Diagnosis not present

## 2018-05-12 DIAGNOSIS — R2689 Other abnormalities of gait and mobility: Secondary | ICD-10-CM

## 2018-05-12 DIAGNOSIS — R262 Difficulty in walking, not elsewhere classified: Secondary | ICD-10-CM

## 2018-05-12 DIAGNOSIS — M6281 Muscle weakness (generalized): Secondary | ICD-10-CM
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
## 2018-05-16 ENCOUNTER — Encounter: Payer: Self-pay | Admitting: Physical Therapy

## 2018-05-16 ENCOUNTER — Ambulatory Visit: Payer: Medicare HMO | Admitting: Physical Therapy

## 2018-05-16 DIAGNOSIS — R2689 Other abnormalities of gait and mobility: Secondary | ICD-10-CM

## 2018-05-16 DIAGNOSIS — M6281 Muscle weakness (generalized): Secondary | ICD-10-CM

## 2018-05-16 DIAGNOSIS — M25562 Pain in left knee: Secondary | ICD-10-CM | POA: Diagnosis not present

## 2018-05-16 DIAGNOSIS — R262 Difficulty in walking, not elsewhere classified: Secondary | ICD-10-CM
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
## 2018-05-19 ENCOUNTER — Ambulatory Visit: Payer: Medicare HMO | Admitting: Physical Therapy

## 2018-05-19 ENCOUNTER — Encounter: Payer: Self-pay | Admitting: Physical Therapy

## 2018-05-19 DIAGNOSIS — M25562 Pain in left knee: Secondary | ICD-10-CM

## 2018-05-19 DIAGNOSIS — M6281 Muscle weakness (generalized): Secondary | ICD-10-CM

## 2018-05-19 DIAGNOSIS — R2689 Other abnormalities of gait and mobility: Secondary | ICD-10-CM

## 2018-05-19 DIAGNOSIS — R262 Difficulty in walking, not elsewhere classified: Secondary | ICD-10-CM
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
## 2018-05-23 ENCOUNTER — Encounter: Payer: Self-pay | Admitting: Physical Therapy

## 2018-05-23 ENCOUNTER — Ambulatory Visit: Payer: Medicare HMO | Admitting: Physical Therapy

## 2018-05-23 DIAGNOSIS — M25562 Pain in left knee: Secondary | ICD-10-CM

## 2018-05-23 DIAGNOSIS — R262 Difficulty in walking, not elsewhere classified: Secondary | ICD-10-CM

## 2018-05-23 DIAGNOSIS — R2689 Other abnormalities of gait and mobility: Secondary | ICD-10-CM

## 2018-05-23 DIAGNOSIS — M6281 Muscle weakness (generalized): Secondary | ICD-10-CM
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
## 2018-05-25 ENCOUNTER — Ambulatory Visit (INDEPENDENT_AMBULATORY_CARE_PROVIDER_SITE_OTHER): Payer: Medicare HMO

## 2018-05-25 DIAGNOSIS — J309 Allergic rhinitis, unspecified: Secondary | ICD-10-CM

## 2018-05-26 ENCOUNTER — Encounter: Payer: Self-pay | Admitting: Physical Therapy

## 2018-05-26 ENCOUNTER — Ambulatory Visit: Payer: Medicare HMO | Attending: Orthopaedic Surgery | Admitting: Physical Therapy

## 2018-05-26 DIAGNOSIS — R262 Difficulty in walking, not elsewhere classified: Secondary | ICD-10-CM | POA: Insufficient documentation

## 2018-05-26 DIAGNOSIS — M25562 Pain in left knee: Secondary | ICD-10-CM | POA: Diagnosis not present

## 2018-05-26 DIAGNOSIS — R2689 Other abnormalities of gait and mobility: Secondary | ICD-10-CM | POA: Diagnosis present

## 2018-05-26 DIAGNOSIS — M79605 Pain in left leg: Secondary | ICD-10-CM | POA: Insufficient documentation

## 2018-05-26 DIAGNOSIS — M6281 Muscle weakness (generalized): Secondary | ICD-10-CM | POA: Insufficient documentation
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
## 2018-05-30 ENCOUNTER — Encounter: Payer: Medicare HMO | Admitting: Physical Therapy

## 2018-05-31 ENCOUNTER — Ambulatory Visit: Payer: Medicare HMO

## 2018-05-31 DIAGNOSIS — M25562 Pain in left knee: Secondary | ICD-10-CM

## 2018-05-31 DIAGNOSIS — M6281 Muscle weakness (generalized): Secondary | ICD-10-CM

## 2018-05-31 DIAGNOSIS — R2689 Other abnormalities of gait and mobility: Secondary | ICD-10-CM

## 2018-05-31 DIAGNOSIS — R262 Difficulty in walking, not elsewhere classified: Secondary | ICD-10-CM
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
## 2018-06-02 ENCOUNTER — Ambulatory Visit: Payer: Medicare HMO

## 2018-06-02 DIAGNOSIS — R262 Difficulty in walking, not elsewhere classified: Secondary | ICD-10-CM

## 2018-06-02 DIAGNOSIS — M25562 Pain in left knee: Secondary | ICD-10-CM

## 2018-06-02 DIAGNOSIS — R2689 Other abnormalities of gait and mobility: Secondary | ICD-10-CM

## 2018-06-02 DIAGNOSIS — M6281 Muscle weakness (generalized): Secondary | ICD-10-CM

## 2018-06-02 DIAGNOSIS — M79605 Pain in left leg: Secondary | ICD-10-CM
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
## 2018-06-06 ENCOUNTER — Ambulatory Visit: Payer: Medicare HMO | Admitting: Physical Therapy

## 2018-06-06 ENCOUNTER — Encounter: Payer: Self-pay | Admitting: Physical Therapy

## 2018-06-06 DIAGNOSIS — M79605 Pain in left leg: Secondary | ICD-10-CM

## 2018-06-06 DIAGNOSIS — M25562 Pain in left knee: Secondary | ICD-10-CM | POA: Diagnosis not present

## 2018-06-06 DIAGNOSIS — R2689 Other abnormalities of gait and mobility: Secondary | ICD-10-CM

## 2018-06-06 DIAGNOSIS — M6281 Muscle weakness (generalized): Secondary | ICD-10-CM

## 2018-06-06 DIAGNOSIS — R262 Difficulty in walking, not elsewhere classified: Secondary | ICD-10-CM
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
## 2018-06-06 NOTE — Addendum Note (Signed)
Addended by: Percival Spanish on: 06/06/2018 06:32 PM   Modules accepted: Orders

## 2018-06-08 ENCOUNTER — Ambulatory Visit (INDEPENDENT_AMBULATORY_CARE_PROVIDER_SITE_OTHER): Payer: Medicare HMO

## 2018-06-08 DIAGNOSIS — J309 Allergic rhinitis, unspecified: Secondary | ICD-10-CM

## 2018-06-09 ENCOUNTER — Encounter: Payer: Self-pay | Admitting: Physical Therapy

## 2018-06-09 ENCOUNTER — Ambulatory Visit: Payer: Medicare HMO | Admitting: Physical Therapy

## 2018-06-09 DIAGNOSIS — R2689 Other abnormalities of gait and mobility: Secondary | ICD-10-CM

## 2018-06-09 DIAGNOSIS — M6281 Muscle weakness (generalized): Secondary | ICD-10-CM

## 2018-06-09 DIAGNOSIS — M25562 Pain in left knee: Secondary | ICD-10-CM

## 2018-06-09 DIAGNOSIS — R262 Difficulty in walking, not elsewhere classified: Secondary | ICD-10-CM
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
## 2018-06-09 NOTE — Progress Notes (Signed)
Vials exp 06-10-19

## 2018-06-13 ENCOUNTER — Ambulatory Visit (INDEPENDENT_AMBULATORY_CARE_PROVIDER_SITE_OTHER): Payer: Medicare HMO | Admitting: *Deleted

## 2018-06-13 DIAGNOSIS — J4550 Severe persistent asthma, uncomplicated: Secondary | ICD-10-CM

## 2018-06-14 ENCOUNTER — Ambulatory Visit: Payer: Medicare HMO | Admitting: Physical Therapy

## 2018-06-14 DIAGNOSIS — M25562 Pain in left knee: Secondary | ICD-10-CM

## 2018-06-14 DIAGNOSIS — R2689 Other abnormalities of gait and mobility: Secondary | ICD-10-CM

## 2018-06-14 DIAGNOSIS — R262 Difficulty in walking, not elsewhere classified: Secondary | ICD-10-CM
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
## 2018-06-16 ENCOUNTER — Ambulatory Visit: Payer: Medicare HMO

## 2018-06-16 DIAGNOSIS — M25562 Pain in left knee: Secondary | ICD-10-CM | POA: Diagnosis not present

## 2018-06-16 DIAGNOSIS — R2689 Other abnormalities of gait and mobility: Secondary | ICD-10-CM

## 2018-06-16 DIAGNOSIS — R262 Difficulty in walking, not elsewhere classified: Secondary | ICD-10-CM

## 2018-06-16 DIAGNOSIS — M6281 Muscle weakness (generalized): Secondary | ICD-10-CM

## 2018-06-16 DIAGNOSIS — M79605 Pain in left leg: Secondary | ICD-10-CM
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
## 2018-06-20 ENCOUNTER — Ambulatory Visit: Payer: Medicare HMO | Admitting: Physical Therapy

## 2018-06-20 ENCOUNTER — Encounter: Payer: Self-pay | Admitting: Physical Therapy

## 2018-06-20 DIAGNOSIS — R2689 Other abnormalities of gait and mobility: Secondary | ICD-10-CM

## 2018-06-20 DIAGNOSIS — M6281 Muscle weakness (generalized): Secondary | ICD-10-CM

## 2018-06-20 DIAGNOSIS — R262 Difficulty in walking, not elsewhere classified: Secondary | ICD-10-CM

## 2018-06-20 DIAGNOSIS — M25562 Pain in left knee: Secondary | ICD-10-CM
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
## 2018-06-21 ENCOUNTER — Ambulatory Visit (INDEPENDENT_AMBULATORY_CARE_PROVIDER_SITE_OTHER): Payer: Medicare HMO

## 2018-06-21 DIAGNOSIS — J309 Allergic rhinitis, unspecified: Secondary | ICD-10-CM | POA: Diagnosis not present

## 2018-06-23 ENCOUNTER — Ambulatory Visit: Payer: Medicare HMO | Admitting: Physical Therapy

## 2018-06-23 DIAGNOSIS — R2689 Other abnormalities of gait and mobility: Secondary | ICD-10-CM

## 2018-06-23 DIAGNOSIS — M25562 Pain in left knee: Secondary | ICD-10-CM | POA: Diagnosis not present

## 2018-06-23 DIAGNOSIS — M6281 Muscle weakness (generalized): Secondary | ICD-10-CM

## 2018-06-23 DIAGNOSIS — R262 Difficulty in walking, not elsewhere classified: Secondary | ICD-10-CM
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
## 2018-06-29 ENCOUNTER — Ambulatory Visit: Payer: Medicare HMO | Attending: Orthopaedic Surgery

## 2018-06-29 ENCOUNTER — Ambulatory Visit: Payer: Medicare HMO

## 2018-06-29 DIAGNOSIS — M6281 Muscle weakness (generalized): Secondary | ICD-10-CM | POA: Diagnosis present

## 2018-06-29 DIAGNOSIS — M25562 Pain in left knee: Secondary | ICD-10-CM | POA: Insufficient documentation

## 2018-06-29 DIAGNOSIS — R2689 Other abnormalities of gait and mobility: Secondary | ICD-10-CM | POA: Insufficient documentation

## 2018-06-29 DIAGNOSIS — R262 Difficulty in walking, not elsewhere classified: Secondary | ICD-10-CM | POA: Diagnosis present
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
## 2018-07-01 ENCOUNTER — Encounter: Payer: Medicare HMO | Admitting: Physical Therapy

## 2018-07-05 ENCOUNTER — Ambulatory Visit (INDEPENDENT_AMBULATORY_CARE_PROVIDER_SITE_OTHER): Payer: Medicare HMO

## 2018-07-05 DIAGNOSIS — J309 Allergic rhinitis, unspecified: Secondary | ICD-10-CM | POA: Diagnosis not present

## 2018-07-11 ENCOUNTER — Ambulatory Visit (INDEPENDENT_AMBULATORY_CARE_PROVIDER_SITE_OTHER): Payer: Medicare HMO

## 2018-07-11 DIAGNOSIS — J309 Allergic rhinitis, unspecified: Secondary | ICD-10-CM

## 2018-07-16 ENCOUNTER — Other Ambulatory Visit: Payer: Self-pay | Admitting: Allergy & Immunology

## 2018-07-16 DIAGNOSIS — J4550 Severe persistent asthma, uncomplicated: Secondary | ICD-10-CM

## 2018-07-17 ENCOUNTER — Other Ambulatory Visit: Payer: Self-pay | Admitting: Allergy & Immunology

## 2018-07-18 NOTE — Telephone Encounter (Signed)
Courtesy refill  

## 2018-07-19 ENCOUNTER — Ambulatory Visit (INDEPENDENT_AMBULATORY_CARE_PROVIDER_SITE_OTHER): Payer: Medicare HMO

## 2018-07-19 DIAGNOSIS — J309 Allergic rhinitis, unspecified: Secondary | ICD-10-CM

## 2018-07-25 ENCOUNTER — Ambulatory Visit (INDEPENDENT_AMBULATORY_CARE_PROVIDER_SITE_OTHER): Payer: Medicare HMO

## 2018-07-25 DIAGNOSIS — J309 Allergic rhinitis, unspecified: Secondary | ICD-10-CM

## 2018-08-08 ENCOUNTER — Ambulatory Visit (INDEPENDENT_AMBULATORY_CARE_PROVIDER_SITE_OTHER): Payer: Medicare HMO

## 2018-08-08 DIAGNOSIS — J4550 Severe persistent asthma, uncomplicated: Secondary | ICD-10-CM | POA: Diagnosis not present

## 2018-08-10 ENCOUNTER — Ambulatory Visit (INDEPENDENT_AMBULATORY_CARE_PROVIDER_SITE_OTHER): Payer: Medicare HMO | Admitting: *Deleted

## 2018-08-10 DIAGNOSIS — J309 Allergic rhinitis, unspecified: Secondary | ICD-10-CM | POA: Diagnosis not present

## 2018-08-15 NOTE — Progress Notes (Signed)
EXP 08/16/19 

## 2018-08-16 DIAGNOSIS — J3089 Other allergic rhinitis: Secondary | ICD-10-CM

## 2018-08-17 DIAGNOSIS — J301 Allergic rhinitis due to pollen: Secondary | ICD-10-CM

## 2018-08-22 ENCOUNTER — Ambulatory Visit (INDEPENDENT_AMBULATORY_CARE_PROVIDER_SITE_OTHER): Payer: Medicare HMO

## 2018-08-22 DIAGNOSIS — J309 Allergic rhinitis, unspecified: Secondary | ICD-10-CM

## 2018-09-05 ENCOUNTER — Ambulatory Visit (INDEPENDENT_AMBULATORY_CARE_PROVIDER_SITE_OTHER): Payer: Medicare HMO

## 2018-09-05 DIAGNOSIS — J309 Allergic rhinitis, unspecified: Secondary | ICD-10-CM

## 2018-09-14 ENCOUNTER — Telehealth (INDEPENDENT_AMBULATORY_CARE_PROVIDER_SITE_OTHER): Payer: Self-pay

## 2018-09-14 ENCOUNTER — Telehealth (INDEPENDENT_AMBULATORY_CARE_PROVIDER_SITE_OTHER): Payer: Self-pay | Admitting: Orthopedic Surgery

## 2018-09-14 NOTE — Telephone Encounter (Signed)
Patient called to request for a refill on liners and shrinkers and other supplies like sock. She is an amputee patient.  Please call patient to advise [phone]

## 2018-09-14 NOTE — Telephone Encounter (Signed)
I called patient and made an appt for her to come in this Fri on 11/22 for re-evaluation per Dr Sharol Given but pt will be seeing Shawn.

## 2018-09-14 NOTE — Telephone Encounter (Signed)
Called patient and scheduled her an appt to come in for re-eval to see Shawn for supplies, liners and shrinker. Done.

## 2018-09-16 ENCOUNTER — Ambulatory Visit (INDEPENDENT_AMBULATORY_CARE_PROVIDER_SITE_OTHER): Payer: Medicare HMO | Admitting: Physician Assistant

## 2018-09-16 ENCOUNTER — Encounter (INDEPENDENT_AMBULATORY_CARE_PROVIDER_SITE_OTHER): Payer: Self-pay | Admitting: Physician Assistant

## 2018-09-16 DIAGNOSIS — M1712 Unilateral primary osteoarthritis, left knee: Secondary | ICD-10-CM | POA: Diagnosis not present

## 2018-09-16 DIAGNOSIS — Z89511 Acquired absence of right leg below knee: Secondary | ICD-10-CM | POA: Diagnosis not present

## 2018-09-16 MED ORDER — METHYLPREDNISOLONE ACETATE 40 MG/ML IJ SUSP
40.0000 mg | INTRAMUSCULAR | Status: AC | PRN
  Administered 2018-09-16: 40 mg via INTRA_ARTICULAR

## 2018-09-16 MED ORDER — LIDOCAINE HCL 1 % IJ SOLN
5.0000 mL | INTRAMUSCULAR | Status: AC | PRN
  Administered 2018-09-16: 5 mL
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
## 2018-09-19 ENCOUNTER — Ambulatory Visit (INDEPENDENT_AMBULATORY_CARE_PROVIDER_SITE_OTHER): Payer: Medicare HMO | Admitting: *Deleted

## 2018-09-19 DIAGNOSIS — J309 Allergic rhinitis, unspecified: Secondary | ICD-10-CM | POA: Diagnosis not present

## 2018-09-28 ENCOUNTER — Other Ambulatory Visit: Payer: Self-pay | Admitting: Allergy & Immunology

## 2018-10-03 ENCOUNTER — Ambulatory Visit: Payer: Self-pay

## 2018-10-10 ENCOUNTER — Ambulatory Visit (INDEPENDENT_AMBULATORY_CARE_PROVIDER_SITE_OTHER): Payer: Medicare HMO

## 2018-10-10 DIAGNOSIS — J309 Allergic rhinitis, unspecified: Secondary | ICD-10-CM | POA: Diagnosis not present

## 2018-10-12 ENCOUNTER — Ambulatory Visit (INDEPENDENT_AMBULATORY_CARE_PROVIDER_SITE_OTHER): Payer: Medicare HMO

## 2018-10-12 DIAGNOSIS — J4550 Severe persistent asthma, uncomplicated: Secondary | ICD-10-CM | POA: Diagnosis not present

## 2018-10-31 ENCOUNTER — Ambulatory Visit (INDEPENDENT_AMBULATORY_CARE_PROVIDER_SITE_OTHER): Payer: Medicare HMO

## 2018-10-31 DIAGNOSIS — J309 Allergic rhinitis, unspecified: Secondary | ICD-10-CM

## 2018-11-03 ENCOUNTER — Other Ambulatory Visit: Payer: Self-pay | Admitting: Allergy & Immunology

## 2018-11-03 DIAGNOSIS — J4550 Severe persistent asthma, uncomplicated: Secondary | ICD-10-CM

## 2018-11-07 ENCOUNTER — Telehealth: Payer: Self-pay | Admitting: Allergy & Immunology

## 2018-11-07 NOTE — Telephone Encounter (Signed)
Patient will see Dr. Ernst Bowler tomorrow afternoon and will arrive by 330. She is aware that there may be a small wait.

## 2018-11-07 NOTE — Telephone Encounter (Signed)
Patient wanted to be seen by Dr. Ernst Bowler tomorrow, he has no openings. I offered her Webb Silversmith on Friday and she declined. She wants to leave a message that she either has a sinus infection or an upper respiratory infection and has had it for a week, but now has green discharge.

## 2018-11-08 ENCOUNTER — Ambulatory Visit (INDEPENDENT_AMBULATORY_CARE_PROVIDER_SITE_OTHER): Payer: Medicare HMO | Admitting: Allergy & Immunology

## 2018-11-08 ENCOUNTER — Encounter: Payer: Self-pay | Admitting: Allergy & Immunology

## 2018-11-08 VITALS — BP 112/80 | HR 83

## 2018-11-08 DIAGNOSIS — J4550 Severe persistent asthma, uncomplicated: Secondary | ICD-10-CM

## 2018-11-08 DIAGNOSIS — J0100 Acute maxillary sinusitis, unspecified: Secondary | ICD-10-CM

## 2018-11-08 DIAGNOSIS — K219 Gastro-esophageal reflux disease without esophagitis: Secondary | ICD-10-CM | POA: Diagnosis not present

## 2018-11-08 DIAGNOSIS — J302 Other seasonal allergic rhinitis: Secondary | ICD-10-CM

## 2018-11-08 DIAGNOSIS — J3089 Other allergic rhinitis: Secondary | ICD-10-CM

## 2018-11-08 MED ORDER — MONTELUKAST SODIUM 10 MG PO TABS: 10.0000 mg | ORAL_TABLET | Freq: Every day | ORAL | 3 refills | Status: DC

## 2018-11-08 MED ORDER — CLARITHROMYCIN 500 MG PO TABS: 500.0000 mg | ORAL_TABLET | Freq: Two times a day (BID) | ORAL | 0 refills | Status: AC

## 2018-11-08 MED ORDER — DEXLANSOPRAZOLE 60 MG PO CPDR: 60.0000 mg | DELAYED_RELEASE_CAPSULE | Freq: Two times a day (BID) | ORAL | 3 refills | Status: DC

## 2018-11-08 MED ORDER — FLUTICASONE PROPIONATE 50 MCG/ACT NA SUSP: 2.0000 | Freq: Every day | NASAL | 3 refills | Status: DC

## 2018-11-08 MED ORDER — ALBUTEROL SULFATE (2.5 MG/3ML) 0.083% IN NEBU: 2.5000 mg | INHALATION_SOLUTION | RESPIRATORY_TRACT | 2 refills | Status: DC | PRN

## 2018-11-08 MED ORDER — ALBUTEROL SULFATE 108 (90 BASE) MCG/ACT IN AEPB: 2.0000 | INHALATION_SPRAY | RESPIRATORY_TRACT | 1 refills | Status: DC | PRN

## 2018-11-08 MED ORDER — FLUCONAZOLE 150 MG PO TABS: 150.0000 mg | ORAL_TABLET | Freq: Once | ORAL | 0 refills | Status: AC

## 2018-11-08 MED ORDER — TIOTROPIUM BROMIDE MONOHYDRATE 1.25 MCG/ACT IN AERS: 2.0000 | INHALATION_SPRAY | Freq: Every day | RESPIRATORY_TRACT | 3 refills | Status: DC

## 2018-11-08 MED ORDER — IPRATROPIUM BROMIDE 0.02 % IN SOLN: 0.5000 mg | Freq: Four times a day (QID) | RESPIRATORY_TRACT | 2 refills | Status: DC

## 2018-11-08 MED ORDER — EPINEPHRINE 0.3 MG/0.3ML IJ SOAJ: 0.3000 mg | Freq: Once | INTRAMUSCULAR | 2 refills | Status: AC
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
## 2018-11-08 NOTE — Patient Instructions (Addendum)
1. Severe persistent asthma, uncomplicated  - Lung function deferred today since you are not feeling well.  - Sample of Stiolto provided today to try in lieu of Spiriva (two puffs once daily) - Daily controller medication(s): Fasenra every 8 weeks and Spiriva 1.4mcg two puffs once daily - Prior to physical activity: ProAir 2 puffs 10-15 minutes before physical activity. - Rescue medications: ProAir 4 puffs every 4-6 hours as needed or albuterol nebulizer one vial puffs every 4-6 hours as needed - Asthma control goals:  * Full participation in all desired activities (may need albuterol before activity) * Albuterol use two time or less a week on average (not counting use with activity) * Cough interfering with sleep two time or less a month * Oral steroids no more than once a year * No hospitalizations  2. Allergic rhinitis - on allergen immunotherapy (prescription altered April 2018) - Continue with allergy shots at the same schedule. - Continue with Flonase two sprays per nostril daily. - Continue with Patanase two sprays per nostril 1-2 times daily. - Continue with the antihistamine (Zyrtec or Allegra) as needed.   3. Acute sinusitis - With your current symptoms and time course, antibiotics are needed: clarithromycin 500mg  twice daily for 14 days - If symptoms are not improving in 3-4 days, feel free to call or text me.  - Start the prednisone pack today.   4. GERD  - Continue with Dexilant 60mg  twice daily.  5. Return in about 6 months (around 05/09/2019).  Please inform us of any Emergency Department visits, hospitalizations, or changes in symptoms. Call us before going to the ED for breathing or allergy symptoms since we might be able to fit you in for a sick visit. Feel free to contact us anytime with any questions, problems, or concerns.  It was a pleasure to see you again today!   Websites that have reliable patient information: 1. American Academy of Asthma, Allergy, and  Immunology: www.aaaai.org 2. Food Allergy Research and Education (FARE): foodallergy.org 3. Mothers of Asthmatics: http://www.asthmacommunitynetwork.org 4. American College of Allergy, Asthma, and Immunology: www.acaai.org

## 2018-11-14 ENCOUNTER — Ambulatory Visit (INDEPENDENT_AMBULATORY_CARE_PROVIDER_SITE_OTHER): Payer: Medicare HMO

## 2018-11-14 DIAGNOSIS — J309 Allergic rhinitis, unspecified: Secondary | ICD-10-CM

## 2018-11-15 ENCOUNTER — Ambulatory Visit: Payer: Medicare HMO | Admitting: Allergy & Immunology

## 2018-11-17 ENCOUNTER — Other Ambulatory Visit: Payer: Self-pay | Admitting: Allergy & Immunology

## 2018-11-17 MED ORDER — OLOPATADINE HCL 0.6 % NA SOLN: NASAL | 1 refills | Status: DC

## 2018-11-17 NOTE — Telephone Encounter (Signed)
Sent in rx.

## 2018-11-17 NOTE — Telephone Encounter (Signed)
Pt called in to refill olopatadine hcl nose spray, 2 sprays in each nostril 1-2 times a day. [facility]

## 2018-11-21 ENCOUNTER — Ambulatory Visit (INDEPENDENT_AMBULATORY_CARE_PROVIDER_SITE_OTHER): Payer: Medicare HMO

## 2018-11-21 DIAGNOSIS — J309 Allergic rhinitis, unspecified: Secondary | ICD-10-CM

## 2018-11-24 ENCOUNTER — Telehealth: Payer: Self-pay | Admitting: *Deleted

## 2018-11-24 MED ORDER — FLUCONAZOLE 150 MG PO TABS: 150.0000 mg | ORAL_TABLET | Freq: Every day | ORAL | 2 refills | Status: DC

## 2018-11-24 MED ORDER — FLUCONAZOLE 150 MG PO TABS: 150.0000 mg | ORAL_TABLET | Freq: Every day | ORAL | 2 refills | Status: AC

## 2018-11-24 NOTE — Telephone Encounter (Signed)
Patient called stating Dr gallagher called her in some medication and patient states whenever she takes an anabiotic she get thrush Dr Ernst Bowler told her he will put refills on fluticasone for her. Patient states there are no refills. Please advise [phone]. [facility] [address] in [place] [phone]

## 2018-11-24 NOTE — Telephone Encounter (Signed)
Patient states that she has thrush from the antibiotic that was sent in for her. She states that Fluconazole helps with her thrush. Please advise.

## 2018-11-24 NOTE — Telephone Encounter (Signed)
I spoke with patient and informed her of med sent. I did have to send to [facility] as the original pharmacy was incorrect.

## 2018-11-24 NOTE — Addendum Note (Signed)
Addended by: Lucrezia Starch I on: 11/24/2018 06:06 PM   Modules accepted: Orders

## 2018-11-24 NOTE — Telephone Encounter (Signed)
Sent in [place]. Please call patient to let her know.  Salvatore Marvel, MD Allergy and [employer] [facility] [place]

## 2018-11-24 NOTE — Addendum Note (Signed)
Addended by: Valentina Shaggy on: 11/24/2018 04:59 PM   Modules accepted: Orders

## 2018-11-29 ENCOUNTER — Ambulatory Visit (INDEPENDENT_AMBULATORY_CARE_PROVIDER_SITE_OTHER): Payer: Medicare HMO

## 2018-11-29 DIAGNOSIS — J309 Allergic rhinitis, unspecified: Secondary | ICD-10-CM

## 2018-12-05 ENCOUNTER — Ambulatory Visit: Payer: Self-pay

## 2018-12-07 ENCOUNTER — Ambulatory Visit: Payer: Self-pay

## 2018-12-08 ENCOUNTER — Ambulatory Visit (INDEPENDENT_AMBULATORY_CARE_PROVIDER_SITE_OTHER): Payer: Medicare HMO

## 2018-12-08 DIAGNOSIS — J4550 Severe persistent asthma, uncomplicated: Secondary | ICD-10-CM

## 2018-12-14 ENCOUNTER — Ambulatory Visit: Payer: Medicare HMO

## 2018-12-14 ENCOUNTER — Ambulatory Visit (INDEPENDENT_AMBULATORY_CARE_PROVIDER_SITE_OTHER): Payer: Medicare HMO

## 2018-12-14 DIAGNOSIS — J309 Allergic rhinitis, unspecified: Secondary | ICD-10-CM

## 2018-12-19 ENCOUNTER — Ambulatory Visit (INDEPENDENT_AMBULATORY_CARE_PROVIDER_SITE_OTHER): Payer: Medicare HMO

## 2018-12-19 DIAGNOSIS — J309 Allergic rhinitis, unspecified: Secondary | ICD-10-CM

## 2018-12-26 ENCOUNTER — Ambulatory Visit (INDEPENDENT_AMBULATORY_CARE_PROVIDER_SITE_OTHER): Payer: Medicare HMO

## 2018-12-26 DIAGNOSIS — J309 Allergic rhinitis, unspecified: Secondary | ICD-10-CM

## 2019-01-02 ENCOUNTER — Ambulatory Visit (INDEPENDENT_AMBULATORY_CARE_PROVIDER_SITE_OTHER): Payer: Medicare HMO

## 2019-01-02 DIAGNOSIS — J309 Allergic rhinitis, unspecified: Secondary | ICD-10-CM | POA: Diagnosis not present

## 2019-01-09 ENCOUNTER — Ambulatory Visit (INDEPENDENT_AMBULATORY_CARE_PROVIDER_SITE_OTHER): Payer: Medicare HMO

## 2019-01-09 DIAGNOSIS — J309 Allergic rhinitis, unspecified: Secondary | ICD-10-CM | POA: Diagnosis not present

## 2019-01-19 ENCOUNTER — Telehealth: Payer: Self-pay | Admitting: Allergy & Immunology

## 2019-01-19 NOTE — Telephone Encounter (Signed)
Patient said she got a letter from [place] that said she needs a prior auth for her 90 day supply for her Dexilant.

## 2019-01-20 NOTE — Telephone Encounter (Signed)
CaseId:[id number];Status:Approved;Review Type:Prior Auth;Coverage Start Date:12/21/2018;Coverage End Date:01/20/2020;

## 2019-01-20 NOTE — Telephone Encounter (Signed)
Dexilant has been approved. Approval sent to pharmacy and patient is aware.

## 2019-01-20 NOTE — Telephone Encounter (Signed)
A PA has been initiated for Dexilant. Called the patient and confirmed their prescription insurance information with [facility]. For future reference the ID# is [id number] RxBIN: [id number] RxGroup: [id number] IJFTZ: [id number]

## 2019-01-23 ENCOUNTER — Ambulatory Visit (INDEPENDENT_AMBULATORY_CARE_PROVIDER_SITE_OTHER): Payer: Medicare HMO

## 2019-01-23 DIAGNOSIS — J309 Allergic rhinitis, unspecified: Secondary | ICD-10-CM

## 2019-01-31 ENCOUNTER — Other Ambulatory Visit: Payer: Self-pay

## 2019-01-31 ENCOUNTER — Ambulatory Visit (INDEPENDENT_AMBULATORY_CARE_PROVIDER_SITE_OTHER): Payer: Medicare HMO

## 2019-01-31 DIAGNOSIS — J4550 Severe persistent asthma, uncomplicated: Secondary | ICD-10-CM | POA: Diagnosis not present

## 2019-01-31 DIAGNOSIS — J309 Allergic rhinitis, unspecified: Secondary | ICD-10-CM

## 2019-02-02 ENCOUNTER — Ambulatory Visit: Payer: Self-pay

## 2019-02-08 NOTE — Progress Notes (Signed)
Exp 02/09/20

## 2019-02-09 DIAGNOSIS — J3089 Other allergic rhinitis: Secondary | ICD-10-CM

## 2019-02-13 ENCOUNTER — Ambulatory Visit (INDEPENDENT_AMBULATORY_CARE_PROVIDER_SITE_OTHER): Payer: Medicare HMO | Admitting: *Deleted

## 2019-02-13 DIAGNOSIS — J309 Allergic rhinitis, unspecified: Secondary | ICD-10-CM | POA: Diagnosis not present

## 2019-02-14 DIAGNOSIS — J301 Allergic rhinitis due to pollen: Secondary | ICD-10-CM

## 2019-03-06 ENCOUNTER — Ambulatory Visit (INDEPENDENT_AMBULATORY_CARE_PROVIDER_SITE_OTHER): Payer: Medicare HMO

## 2019-03-06 DIAGNOSIS — J309 Allergic rhinitis, unspecified: Secondary | ICD-10-CM | POA: Diagnosis not present

## 2019-03-27 ENCOUNTER — Ambulatory Visit (INDEPENDENT_AMBULATORY_CARE_PROVIDER_SITE_OTHER): Payer: Medicare HMO

## 2019-03-27 DIAGNOSIS — J309 Allergic rhinitis, unspecified: Secondary | ICD-10-CM

## 2019-03-28 ENCOUNTER — Ambulatory Visit: Payer: Self-pay

## 2019-03-29 ENCOUNTER — Ambulatory Visit (INDEPENDENT_AMBULATORY_CARE_PROVIDER_SITE_OTHER): Payer: Medicare HMO

## 2019-03-29 DIAGNOSIS — J4550 Severe persistent asthma, uncomplicated: Secondary | ICD-10-CM | POA: Diagnosis not present

## 2019-03-30 ENCOUNTER — Other Ambulatory Visit: Payer: Self-pay

## 2019-04-11 ENCOUNTER — Other Ambulatory Visit: Payer: Self-pay | Admitting: Allergy & Immunology

## 2019-04-24 ENCOUNTER — Ambulatory Visit (INDEPENDENT_AMBULATORY_CARE_PROVIDER_SITE_OTHER): Payer: Medicare HMO

## 2019-04-24 DIAGNOSIS — J309 Allergic rhinitis, unspecified: Secondary | ICD-10-CM

## 2019-05-02 ENCOUNTER — Ambulatory Visit (INDEPENDENT_AMBULATORY_CARE_PROVIDER_SITE_OTHER): Payer: Medicare HMO

## 2019-05-02 DIAGNOSIS — J309 Allergic rhinitis, unspecified: Secondary | ICD-10-CM

## 2019-05-11 ENCOUNTER — Ambulatory Visit (INDEPENDENT_AMBULATORY_CARE_PROVIDER_SITE_OTHER): Payer: Medicare HMO | Admitting: Allergy & Immunology

## 2019-05-11 ENCOUNTER — Ambulatory Visit: Payer: Self-pay

## 2019-05-11 ENCOUNTER — Other Ambulatory Visit: Payer: Self-pay

## 2019-05-11 ENCOUNTER — Encounter: Payer: Self-pay | Admitting: Allergy & Immunology

## 2019-05-11 ENCOUNTER — Ambulatory Visit: Payer: Medicare HMO | Admitting: Allergy & Immunology

## 2019-05-11 VITALS — BP 112/68 | HR 72 | Temp 98.2°F | Resp 16 | Ht 64.0 in | Wt 220.0 lb

## 2019-05-11 DIAGNOSIS — J4550 Severe persistent asthma, uncomplicated: Secondary | ICD-10-CM

## 2019-05-11 DIAGNOSIS — J302 Other seasonal allergic rhinitis: Secondary | ICD-10-CM

## 2019-05-11 DIAGNOSIS — K219 Gastro-esophageal reflux disease without esophagitis: Secondary | ICD-10-CM | POA: Diagnosis not present

## 2019-05-11 DIAGNOSIS — J3089 Other allergic rhinitis: Secondary | ICD-10-CM | POA: Diagnosis not present

## 2019-05-11 DIAGNOSIS — T7800XD Anaphylactic reaction due to unspecified food, subsequent encounter: Secondary | ICD-10-CM | POA: Diagnosis not present

## 2019-05-11 DIAGNOSIS — J309 Allergic rhinitis, unspecified: Secondary | ICD-10-CM

## 2019-05-11 NOTE — Patient Instructions (Addendum)
1. Severe persistent asthma, uncomplicated  - Lung function looked stable today.  - Daily controller medication(s): Fasenra every 8 weeks and Spiriva 1.27mcg two puffs once daily - Prior to physical activity: ProAir 2 puffs 10-15 minutes before physical activity. - Rescue medications: ProAir 4 puffs every 4-6 hours as needed or albuterol nebulizer one vial puffs every 4-6 hours as needed - Asthma control goals:  * Full participation in all desired activities (may need albuterol before activity) * Albuterol use two time or less a week on average (not counting use with activity) * Cough interfering with sleep two time or less a month * Oral steroids no more than once a year * No hospitalizations  2. Allergic rhinitis - on allergen immunotherapy (prescription altered April 2018) - Continue with allergy shots at the same schedule. - Try weaning off of the nose sprays as tolerated (do one spray as needed for a couple of weeks and then the other one as needed for another couple of weeks).  - Continue with Flonase two sprays per nostril daily. - Continue with Patanase two sprays per nostril 1-2 times daily. - Continue with the antihistamine (Zyrtec or Allegra) as needed.   3. GERD  - Continue with Dexilant 60mg  twice daily.  4. Return in about 3 months (around 08/11/2019). (You are scheduled for October 5th at 1:30pm in [place])  Please inform us of any Emergency Department visits, hospitalizations, or changes in symptoms. Call us before going to the ED for breathing or allergy symptoms since we might be able to fit you in for a sick visit. Feel free to contact us anytime with any questions, problems, or concerns.  It was a pleasure to see you again today!   Websites that have reliable patient information: 1. American Academy of Asthma, Allergy, and Immunology: www.aaaai.org 2. Food Allergy Research and Education (FARE): foodallergy.org 3. Mothers of Asthmatics:  http://www.asthmacommunitynetwork.org 4. American College of Allergy, Asthma, and Immunology: www.acaai.org

## 2019-05-11 NOTE — Progress Notes (Signed)
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 Severe persistent asthma, uncomplicated  - Lung function looked stable today.  - Daily controller medication(s): Fasenra every 8 weeks and Spiriva 1.52mcg two puffs once daily - Prior to physical activity: ProAir 2 puffs 10-15 minutes before physical activity. - Rescue medications: ProAir 4 puffs every 4-6 hours as needed or albuterol nebulizer one vial puffs every 4-6 hours as needed - Asthma control goals:  * Full participation in all desired activities (may need albuterol before activity) * Albuterol use two time or less a week on average (not counting use with activity) * Cough interfering with sleep two time or less a month * Oral steroids no more than once a year * No hospitalizations  2. Allergic rhinitis - on allergen immunotherapy (prescription altered April 2018) - Continue with allergy shots at the same schedule. - Try weaning off of the nose sprays as tolerated (do one spray as needed for a couple of weeks and then the other one as needed for another couple of weeks).  - Continue with Flonase two sprays per nostril daily. - Continue with Patanase two sprays per nostril 1-2 times daily. - Continue with the antihistamine (Zyrtec or Allegra) as needed.   3. GERD  - Continue with Dexilant 60mg  twice daily.  4. Return in about 3 months (around 08/11/2019). (You are scheduled for October 5th at 1:30pm in [place])  Subjective:   Joanna [name] is a 53 y.o. female presenting today for follow up of No chief  complaint on file.   Joanna [name] has a history of the following: Patient Active Problem List   Diagnosis Date Noted  . Moderate persistent asthma without complication 46/56/8127  . Seasonal and perennial allergic rhinitis 04/16/2017  . Impingement syndrome of both shoulders 11/17/2016  . History of insect sting allergy 06/03/2016  . Thrush 06/03/2016  . 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  . Primary immune deficiency disorder ([place]) 05/15/2016  . Immunodeficiency ([place]) 05/15/2016  . Allergic rhinitis due to pollen 05/15/2016  . Insect sting allergy, current reaction 05/15/2016  . Severe persistent asthma 05/12/2016  . 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 Essential hypertension 05/12/2016  . Gastroesophageal reflux disease 05/12/2016  . 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  Joanna [name] is a 53 y.o. female presenting for a follow up visit. She was last seen in January 2020. At that time, she was not feeling well and we ended up treating her for sinusitis with antibiotics (Biaxin) as well as a prednisone burst. For her asthma, we decided to try her on Stiolto two puffs once daily. We also continued her on Fasenra every 8 weeks. Her Spiriva was held because of the Stiolto that was started. She was continued on albuterol as needed. Her allergic rhinitis was controlled with the allergen immunotherapy. She also remained on the Flonase and the Patanase with the PRN use of antihistamines. Dexilant was continued (60mg  BID).   Since the last visit, she has mostly done well. She does note that the blooming trees seem to  make her cough more than before. She says that she always has some trouble around this time of the year.   Asthma/Respiratory Symptom History: She remains on the Spiriva and the [place]. She has never been able use any inhaled steroids. She has used her albuterol  once in the last month and then before that it was in May. Overall she has remained fairly stable for a long period of time with this regimen. 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   Allergic Rhinitis Symptom History: She has done well on her allergen immunotherapy. We remixed the vials in 2018 and this has provided good control of her symptoms. She is taking both of her nasal sprays as prescribed. She does not like the taste of the Patanase. She is not taking the antihistamines on a routine basis.  She is getting along with two of her sisters and completely cut off the other three. There was a big rift when he mother passed away in 12/18/18 and it was an emotional event for her. She tells me that she has blocked them on social media and wants nothing at all to do with their lives.  Otherwise, there have been no changes to her past medical history, surgical history, family history, or social history.    Review of Systems  Constitutional: Negative.  Negative for chills, fever, malaise/fatigue and weight loss.  HENT: Negative.  Negative for congestion, ear discharge, ear pain and sore throat.   Eyes: Negative for pain, discharge and redness.  Respiratory: Negative for cough, sputum production, shortness of breath and wheezing.   Cardiovascular: Negative.  Negative for chest pain and palpitations.  Gastrointestinal: Negative for abdominal pain, heartburn and nausea.  Skin: Negative.  Negative for itching and rash.  Neurological: Negative for dizziness and headaches.  Endo/Heme/Allergies: Negative for environmental allergies. Does not bruise/bleed easily.       Objective:   Blood pressure 112/68, pulse 72, temperature 98.2 F (36.8 C), temperature source Temporal, resp. rate 16, height 5\' 4"  (1.626 m), weight 220 lb (99.8 kg), SpO2 98 %. Body mass index is 37.76 kg/m.   Physical Exam:  Physical Exam  Constitutional: She appears well-developed.  Very pleasant female. She seems more  upbeat now than she has in quite some time.   HENT:  Head: Normocephalic and atraumatic.  Right Ear: Tympanic membrane, external ear and ear canal normal.  Left Ear: Tympanic membrane and ear canal normal.  Nose: No mucosal edema, rhinorrhea, nasal deformity or septal deviation. No epistaxis. Right sinus exhibits no maxillary sinus tenderness and no frontal sinus tenderness. Left sinus exhibits no maxillary sinus tenderness and no frontal sinus tenderness.  Mouth/Throat: Uvula is midline and oropharynx is clear and moist. Mucous membranes are not pale and not dry.  Eyes: Pupils are equal, round, and reactive to light. Conjunctivae and EOM are normal. Right eye exhibits no chemosis and no discharge. Left eye exhibits no chemosis and no discharge. Right conjunctiva is not injected. Left conjunctiva is not injected.  Cardiovascular: Normal rate, regular rhythm and normal heart sounds.  Respiratory: Effort normal and breath sounds normal. No accessory muscle usage. No tachypnea. No respiratory distress. She has no wheezes. She has no rhonchi. She has no rales. She exhibits no tenderness.  Moving air well in all lung fields.   Musculoskeletal:     Comments: Artifical leg in place.   Lymphadenopathy:    She has no cervical adenopathy.  Neurological: She is alert.  Skin: No abrasion, no petechiae  and no rash noted. Rash is not papular, not vesicular and not urticarial. No erythema. No pallor.  Psychiatric: She has a normal mood and affect.     Diagnostic studies:    Spirometry: results abnormal (FEV1: 1.87/69%, FVC: 2.20/64%, FEV1/FVC: 85%).    Spirometry consistent with possible restrictive disease.   Allergy Studies: none        Salvatore Marvel, MD  Allergy and [facility] [place]

## 2019-05-15 ENCOUNTER — Other Ambulatory Visit: Payer: Self-pay | Admitting: *Deleted

## 2019-05-15 MED ORDER — BENRALIZUMAB 30 MG/ML ~~LOC~~ SOSY: 30.0000 mg | PREFILLED_SYRINGE | SUBCUTANEOUS | 6 refills | Status: DC

## 2019-05-15 NOTE — Telephone Encounter (Signed)
Per Lisabeth Pick patient needs refill for [facility] sent to [facility]. Rx sent

## 2019-05-18 ENCOUNTER — Ambulatory Visit (INDEPENDENT_AMBULATORY_CARE_PROVIDER_SITE_OTHER): Payer: Medicare HMO

## 2019-05-18 DIAGNOSIS — J309 Allergic rhinitis, unspecified: Secondary | ICD-10-CM

## 2019-05-24 ENCOUNTER — Other Ambulatory Visit: Payer: Self-pay

## 2019-05-24 ENCOUNTER — Ambulatory Visit (INDEPENDENT_AMBULATORY_CARE_PROVIDER_SITE_OTHER): Payer: Medicare HMO

## 2019-05-24 DIAGNOSIS — J4540 Moderate persistent asthma, uncomplicated: Secondary | ICD-10-CM

## 2019-05-24 DIAGNOSIS — J4550 Severe persistent asthma, uncomplicated: Secondary | ICD-10-CM

## 2019-05-26 ENCOUNTER — Ambulatory Visit (INDEPENDENT_AMBULATORY_CARE_PROVIDER_SITE_OTHER): Payer: Medicare HMO

## 2019-05-26 DIAGNOSIS — J309 Allergic rhinitis, unspecified: Secondary | ICD-10-CM

## 2019-06-12 ENCOUNTER — Ambulatory Visit (INDEPENDENT_AMBULATORY_CARE_PROVIDER_SITE_OTHER): Payer: Medicare HMO

## 2019-06-12 DIAGNOSIS — J309 Allergic rhinitis, unspecified: Secondary | ICD-10-CM | POA: Diagnosis not present

## 2019-06-29 ENCOUNTER — Ambulatory Visit (INDEPENDENT_AMBULATORY_CARE_PROVIDER_SITE_OTHER): Payer: Medicare HMO

## 2019-06-29 DIAGNOSIS — J309 Allergic rhinitis, unspecified: Secondary | ICD-10-CM | POA: Diagnosis not present

## 2019-07-18 ENCOUNTER — Ambulatory Visit (INDEPENDENT_AMBULATORY_CARE_PROVIDER_SITE_OTHER): Payer: Medicare HMO

## 2019-07-18 DIAGNOSIS — J309 Allergic rhinitis, unspecified: Secondary | ICD-10-CM | POA: Diagnosis not present

## 2019-07-19 ENCOUNTER — Ambulatory Visit: Payer: Self-pay

## 2019-07-20 ENCOUNTER — Other Ambulatory Visit: Payer: Self-pay

## 2019-07-20 ENCOUNTER — Ambulatory Visit (INDEPENDENT_AMBULATORY_CARE_PROVIDER_SITE_OTHER): Payer: Medicare HMO

## 2019-07-20 DIAGNOSIS — J4550 Severe persistent asthma, uncomplicated: Secondary | ICD-10-CM | POA: Diagnosis not present

## 2019-07-20 DIAGNOSIS — J4540 Moderate persistent asthma, uncomplicated: Secondary | ICD-10-CM

## 2019-07-24 DIAGNOSIS — J3089 Other allergic rhinitis: Secondary | ICD-10-CM

## 2019-07-24 NOTE — Progress Notes (Signed)
VIALS EXP 07-23-20 

## 2019-07-25 DIAGNOSIS — J3081 Allergic rhinitis due to animal (cat) (dog) hair and dander: Secondary | ICD-10-CM

## 2019-07-31 ENCOUNTER — Ambulatory Visit (INDEPENDENT_AMBULATORY_CARE_PROVIDER_SITE_OTHER): Payer: Medicare HMO | Admitting: Allergy & Immunology

## 2019-07-31 ENCOUNTER — Encounter: Payer: Self-pay | Admitting: Allergy & Immunology

## 2019-07-31 ENCOUNTER — Other Ambulatory Visit: Payer: Self-pay

## 2019-07-31 VITALS — BP 118/78 | HR 80 | Temp 96.9°F | Resp 16

## 2019-07-31 DIAGNOSIS — J3089 Other allergic rhinitis: Secondary | ICD-10-CM | POA: Diagnosis not present

## 2019-07-31 DIAGNOSIS — J302 Other seasonal allergic rhinitis: Secondary | ICD-10-CM

## 2019-07-31 DIAGNOSIS — J4550 Severe persistent asthma, uncomplicated: Secondary | ICD-10-CM | POA: Diagnosis not present

## 2019-07-31 DIAGNOSIS — T7800XD Anaphylactic reaction due to unspecified food, subsequent encounter: Secondary | ICD-10-CM

## 2019-07-31 NOTE — Progress Notes (Signed)
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 Severe persistent asthma, uncomplicated  - Lung function looked stable today.   - We are not going to make any changes since you are doing so well.  - Daily controller medication(s): Fasenra every 8 weeks and Spiriva 1.84mcg two puffs once daily - Prior to physical activity: ProAir 2 puffs 10-15 minutes before physical activity. - Rescue medications: ProAir 4 puffs every 4-6 hours as needed or albuterol nebulizer one vial puffs every 4-6 hours as needed - Asthma control goals:  * Full participation in all desired activities (may need albuterol before activity) * Albuterol use two time or less a week on average (not counting use with activity) * Cough interfering with sleep two time or less a month * Oral steroids no more than once a year * No hospitalizations  2. Allergic rhinitis - on allergen immunotherapy (prescription altered April 2018) - Continue with allergy shots at the same schedule. - Try weaning off of the nose sprays as tolerated, but we will start in the winter once all of the pollen counts are down.  - Continue with Flonase two sprays per nostril daily. - Continue with Patanase two sprays per nostril 1-2 times daily. - Continue with the antihistamine (Zyrtec or Allegra) as needed.   3. GERD  - Continue with Dexilant 60mg  twice daily.  4. Return in about 6 months (around 01/29/2020).  Subjective:   Joanna [name] is a 53 y.o. 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  Joanna [name] is a 53 y.o. female presenting for a follow up visit. Joanna [name] was last seen in July 2020. At that time, her lung function looked stable. We continued with [name] every 8 weeks as well as Spiriva 1.17mcg two puffs once daily. We also continued with albuterol as needed. Allergic rhinitis has been well controlled on her allergen immunotherapy. We continued with Flonase and Patanase two daily, but we discussed doing away with one or both of the nasal sprays on a daily basis. We continued with cetirizine or fexofenadine as needed. Reflux was controlled with Dexilant 60mg  BID.   Since the last visit, Joanna [name] has done well. She was going to go to see her sisters in [place] and  Olin Hauser) over the last weekend but she decided to cancel due to the COVID 19 pandemic. She and her husband were celebrating 56 years together.   Asthma/Respiratory Symptom History: She remains on the Spiriva two puffs once daily. She is on the [place] every 8w weeks ago as well.   Joanna [name]'s asthma has been well controlled. She has not required rescue medication, experienced nocturnal awakenings due to lower respiratory symptoms, nor have activities of daily living been limited. She has required no Emergency Department or Urgent Care visits for her asthma. She has required zero courses of systemic steroids for asthma exacerbations since the last visit. ACT score today is 25, indicating excellent asthma symptom control.  She remains on the Dexilant 60 mg twice daily.  Allergic Rhinitis Symptom History: Allergen immunotherapy is going well. The fall season is typically the worse for her. She has not been off of her nasal sprays since she decided to try this over the winter when the pollen counts were lower. She has not needed any antibiotics at all.   Food Allergy Symptom History: She continues to avoid shellfish. She has had no accidental ingestion whatsoever. EpiPen is up to date.   There is discussion about having her right knee replaced, but she does not want to go through it at this time.   Otherwise, there have been no changes to her past medical history, surgical history, family history, or social history.    Review of Systems  Constitutional: Negative.  Negative for chills, fever, malaise/fatigue and weight loss.  HENT: Negative.  Negative for congestion, ear discharge, ear pain, sinus pain and sore throat.   Eyes: Negative for pain, discharge and redness.  Respiratory: Negative for cough, sputum production, shortness of breath and wheezing.   Cardiovascular: Negative.  Negative for chest pain and palpitations.  Gastrointestinal: Positive for heartburn. Negative for abdominal pain, constipation, diarrhea, nausea and vomiting.  Skin: Negative.  Negative for itching and rash.  Neurological: Negative for dizziness and headaches.  Endo/Heme/Allergies: Negative for environmental allergies. Does not bruise/bleed easily.       Objective:   Blood pressure 118/78, pulse  80, temperature (!) 96.9 F (36.1 C), temperature source Temporal, resp. rate 16, SpO2 94 %. There is no height or weight on file to calculate BMI.   Physical Exam:  Physical Exam  Constitutional: She appears well-developed.  Pleasant female.  HENT:  Head: Normocephalic and atraumatic.  Right Ear: Tympanic membrane, external ear and ear canal normal.  Left Ear: Tympanic membrane, external ear and ear canal normal.  Nose: Rhinorrhea present. No mucosal edema, nose lacerations, sinus tenderness, nasal deformity or septal deviation. No epistaxis. Right sinus exhibits no maxillary sinus tenderness and no frontal sinus tenderness. Left sinus exhibits no maxillary sinus tenderness and no frontal sinus tenderness.  Mouth/Throat: Uvula is midline and oropharynx is clear and moist. Mucous membranes are not pale and not dry.  Eyes: Pupils are equal, round, and reactive to light. Conjunctivae and EOM are normal. Right eye exhibits no chemosis and no discharge. Left eye exhibits no chemosis and no discharge. Right conjunctiva is not injected. Left conjunctiva is not injected.  Cardiovascular: Normal rate, regular rhythm and normal heart sounds.  Respiratory: Effort normal and breath sounds normal. No accessory muscle usage. No tachypnea. No respiratory distress. She has no wheezes. She has no rhonchi. She has no rales. She exhibits no tenderness.  Moving air well in all lung fields.  Lymphadenopathy:    She has no cervical adenopathy.  Neurological: She is alert.  Skin: No abrasion, no petechiae and no rash noted. Rash is not papular, not vesicular and not urticarial. No erythema. No pallor.  No urticarial or eczematous lesions noted.  Psychiatric: She has a normal mood and affect.     Diagnostic studies: none       Salvatore Marvel, MD  [facility] and [facility]

## 2019-07-31 NOTE — Patient Instructions (Addendum)
1. Severe persistent asthma, uncomplicated  - Lung function looked stable today.   - We are not going to make any changes since you are doing so well.  - Daily controller medication(s): Fasenra every 8 weeks and Spiriva 1.30mcg two puffs once daily - Prior to physical activity: ProAir 2 puffs 10-15 minutes before physical activity. - Rescue medications: ProAir 4 puffs every 4-6 hours as needed or albuterol nebulizer one vial puffs every 4-6 hours as needed - Asthma control goals:  * Full participation in all desired activities (may need albuterol before activity) * Albuterol use two time or less a week on average (not counting use with activity) * Cough interfering with sleep two time or less a month * Oral steroids no more than once a year * No hospitalizations  2. Allergic rhinitis - on allergen immunotherapy (prescription altered April 2018) - Continue with allergy shots at the same schedule. - Try weaning off of the nose sprays as tolerated, but we will start in the winter once all of the pollen counts are down.  - Continue with Flonase two sprays per nostril daily. - Continue with Patanase two sprays per nostril 1-2 times daily. - Continue with the antihistamine (Zyrtec or Allegra) as needed.   3. GERD  - Continue with Dexilant 60mg  twice daily.  4. Return in about 6 months (around 01/29/2020).  Please inform us of any Emergency Department visits, hospitalizations, or changes in symptoms. Call us before going to the ED for breathing or allergy symptoms since we might be able to fit you in for a sick visit. Feel free to contact us anytime with any questions, problems, or concerns.  It was a pleasure to see you again today!   Websites that have reliable patient information: 1. American Academy of Asthma, Allergy, and Immunology: www.aaaai.org 2. Food Allergy Research and Education (FARE): foodallergy.org 3. Mothers of Asthmatics: http://www.asthmacommunitynetwork.org 4. American  College of Allergy, Asthma, and Immunology: www.acaai.org

## 2019-08-08 ENCOUNTER — Ambulatory Visit (INDEPENDENT_AMBULATORY_CARE_PROVIDER_SITE_OTHER): Payer: Medicare HMO

## 2019-08-08 DIAGNOSIS — J309 Allergic rhinitis, unspecified: Secondary | ICD-10-CM | POA: Diagnosis not present

## 2019-08-29 ENCOUNTER — Ambulatory Visit (INDEPENDENT_AMBULATORY_CARE_PROVIDER_SITE_OTHER): Payer: Medicare HMO

## 2019-08-29 DIAGNOSIS — J309 Allergic rhinitis, unspecified: Secondary | ICD-10-CM | POA: Diagnosis not present

## 2019-09-14 ENCOUNTER — Ambulatory Visit (INDEPENDENT_AMBULATORY_CARE_PROVIDER_SITE_OTHER): Payer: Medicare HMO

## 2019-09-14 ENCOUNTER — Other Ambulatory Visit: Payer: Self-pay

## 2019-09-14 DIAGNOSIS — J4550 Severe persistent asthma, uncomplicated: Secondary | ICD-10-CM | POA: Diagnosis not present

## 2019-09-18 ENCOUNTER — Ambulatory Visit (INDEPENDENT_AMBULATORY_CARE_PROVIDER_SITE_OTHER): Payer: Medicare HMO

## 2019-09-18 DIAGNOSIS — J309 Allergic rhinitis, unspecified: Secondary | ICD-10-CM | POA: Diagnosis not present

## 2019-09-26 ENCOUNTER — Ambulatory Visit (INDEPENDENT_AMBULATORY_CARE_PROVIDER_SITE_OTHER): Payer: Medicare HMO

## 2019-09-26 DIAGNOSIS — J309 Allergic rhinitis, unspecified: Secondary | ICD-10-CM | POA: Diagnosis not present

## 2019-10-03 ENCOUNTER — Ambulatory Visit (INDEPENDENT_AMBULATORY_CARE_PROVIDER_SITE_OTHER): Payer: Medicare HMO

## 2019-10-03 DIAGNOSIS — J309 Allergic rhinitis, unspecified: Secondary | ICD-10-CM

## 2019-10-09 ENCOUNTER — Ambulatory Visit (INDEPENDENT_AMBULATORY_CARE_PROVIDER_SITE_OTHER): Payer: Medicare HMO

## 2019-10-09 DIAGNOSIS — J309 Allergic rhinitis, unspecified: Secondary | ICD-10-CM | POA: Diagnosis not present

## 2019-10-16 ENCOUNTER — Ambulatory Visit (INDEPENDENT_AMBULATORY_CARE_PROVIDER_SITE_OTHER): Payer: Medicare HMO

## 2019-10-16 DIAGNOSIS — J309 Allergic rhinitis, unspecified: Secondary | ICD-10-CM

## 2019-11-03 ENCOUNTER — Other Ambulatory Visit: Payer: Self-pay | Admitting: Allergy & Immunology

## 2019-11-06 ENCOUNTER — Ambulatory Visit (INDEPENDENT_AMBULATORY_CARE_PROVIDER_SITE_OTHER): Payer: Medicare HMO

## 2019-11-06 DIAGNOSIS — J309 Allergic rhinitis, unspecified: Secondary | ICD-10-CM

## 2019-11-09 ENCOUNTER — Ambulatory Visit: Payer: Self-pay

## 2019-11-10 ENCOUNTER — Other Ambulatory Visit: Payer: Self-pay

## 2019-11-10 ENCOUNTER — Ambulatory Visit (INDEPENDENT_AMBULATORY_CARE_PROVIDER_SITE_OTHER): Payer: Medicare HMO

## 2019-11-10 DIAGNOSIS — J4550 Severe persistent asthma, uncomplicated: Secondary | ICD-10-CM

## 2019-11-21 ENCOUNTER — Telehealth: Payer: Self-pay

## 2019-11-21 NOTE — Telephone Encounter (Signed)
Pa for dexilant was approved, express kicked back and said pa's was not needed for olopatadine, flonase, spiriva. Informed pt of all this and asked her to call us if she had any issues.

## 2019-11-21 NOTE — Telephone Encounter (Signed)
Patient called and needs new PA's for fasenra, dexilant, spirivia, olopatadine nasal, fluticasone nasal spray. I informed her I would start working on PA's and would inform tammy of the fasenra PA

## 2019-11-22 ENCOUNTER — Other Ambulatory Visit: Payer: Self-pay | Admitting: Allergy & Immunology

## 2019-11-22 DIAGNOSIS — J4550 Severe persistent asthma, uncomplicated: Secondary | ICD-10-CM

## 2019-11-23 DIAGNOSIS — J3089 Other allergic rhinitis: Secondary | ICD-10-CM

## 2019-11-23 NOTE — Progress Notes (Signed)
Exp 11/22/20

## 2019-11-27 ENCOUNTER — Ambulatory Visit (INDEPENDENT_AMBULATORY_CARE_PROVIDER_SITE_OTHER): Payer: Medicare HMO

## 2019-11-27 DIAGNOSIS — J309 Allergic rhinitis, unspecified: Secondary | ICD-10-CM | POA: Diagnosis not present

## 2019-11-28 DIAGNOSIS — J301 Allergic rhinitis due to pollen: Secondary | ICD-10-CM

## 2019-12-20 ENCOUNTER — Ambulatory Visit (INDEPENDENT_AMBULATORY_CARE_PROVIDER_SITE_OTHER): Payer: Medicare HMO

## 2019-12-20 DIAGNOSIS — J309 Allergic rhinitis, unspecified: Secondary | ICD-10-CM

## 2019-12-28 ENCOUNTER — Telehealth: Payer: Self-pay | Admitting: Allergy & Immunology

## 2019-12-28 NOTE — Telephone Encounter (Signed)
Patient called to see if she should get the covid shot with allergy and asthma. [phone].

## 2019-12-28 NOTE — Telephone Encounter (Signed)
Called patient and reviewed algorithm and patient verbalized that she does not have any facial fillers or history of reactions to other medications. Advised to patient that she should be fine with getting the COVID vaccine. Patient verbalized understanding.

## 2019-12-31 ENCOUNTER — Other Ambulatory Visit: Payer: Self-pay | Admitting: Allergy & Immunology

## 2020-01-01 ENCOUNTER — Other Ambulatory Visit: Payer: Self-pay | Admitting: Allergy & Immunology

## 2020-01-02 ENCOUNTER — Telehealth: Payer: Self-pay | Admitting: Allergy & Immunology

## 2020-01-02 MED ORDER — FLUCONAZOLE 200 MG PO TABS: 200.0000 mg | ORAL_TABLET | Freq: Every day | ORAL | 0 refills | Status: AC

## 2020-01-02 NOTE — Telephone Encounter (Signed)
Patient contacted me reporting oral thrush.  She has not had this problem in approximately 1 year.  I will give her a dose of fluconazole.  Patient will let me know if she needs additional doses.  Salvatore Marvel, MD Allergy and [facility]

## 2020-01-04 ENCOUNTER — Ambulatory Visit: Payer: Self-pay

## 2020-01-04 ENCOUNTER — Ambulatory Visit (INDEPENDENT_AMBULATORY_CARE_PROVIDER_SITE_OTHER): Payer: Medicare HMO | Admitting: *Deleted

## 2020-01-04 ENCOUNTER — Other Ambulatory Visit: Payer: Self-pay

## 2020-01-04 DIAGNOSIS — J4550 Severe persistent asthma, uncomplicated: Secondary | ICD-10-CM

## 2020-01-05 ENCOUNTER — Ambulatory Visit: Payer: Self-pay

## 2020-01-09 ENCOUNTER — Other Ambulatory Visit: Payer: Self-pay

## 2020-01-09 MED ORDER — DEXILANT 60 MG PO CPDR: 1.0000 | DELAYED_RELEASE_CAPSULE | Freq: Two times a day (BID) | ORAL | 0 refills | Status: DC

## 2020-01-11 ENCOUNTER — Ambulatory Visit (INDEPENDENT_AMBULATORY_CARE_PROVIDER_SITE_OTHER): Payer: Medicare HMO | Admitting: *Deleted

## 2020-01-11 DIAGNOSIS — J309 Allergic rhinitis, unspecified: Secondary | ICD-10-CM | POA: Diagnosis not present

## 2020-01-29 ENCOUNTER — Other Ambulatory Visit: Payer: Self-pay

## 2020-01-29 ENCOUNTER — Encounter: Payer: Self-pay | Admitting: Allergy & Immunology

## 2020-01-29 ENCOUNTER — Ambulatory Visit: Payer: Medicare HMO | Admitting: Allergy & Immunology

## 2020-01-29 VITALS — BP 120/76 | HR 72 | Temp 98.1°F | Resp 16 | Ht 62.0 in | Wt 240.0 lb

## 2020-01-29 DIAGNOSIS — T7800XD Anaphylactic reaction due to unspecified food, subsequent encounter: Secondary | ICD-10-CM

## 2020-01-29 DIAGNOSIS — J302 Other seasonal allergic rhinitis: Secondary | ICD-10-CM | POA: Diagnosis not present

## 2020-01-29 DIAGNOSIS — J3089 Other allergic rhinitis: Secondary | ICD-10-CM

## 2020-01-29 DIAGNOSIS — H1010 Acute atopic conjunctivitis, unspecified eye: Secondary | ICD-10-CM

## 2020-01-29 DIAGNOSIS — J309 Allergic rhinitis, unspecified: Secondary | ICD-10-CM | POA: Diagnosis not present

## 2020-01-29 DIAGNOSIS — J4550 Severe persistent asthma, uncomplicated: Secondary | ICD-10-CM | POA: Diagnosis not present

## 2020-01-29 DIAGNOSIS — K219 Gastro-esophageal reflux disease without esophagitis: Secondary | ICD-10-CM

## 2020-01-29 NOTE — Progress Notes (Signed)
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 Severe persistent asthma, uncomplicated  - Lung function looked stable today.   - We are not going to make any changes since you are doing so well.  - Daily controller medication(s): Fasenra every 8 weeks and Spiriva 1.16mcg two puffs once daily - Prior to physical activity: ProAir 2 puffs 10-15 minutes before physical activity. - Rescue medications: ProAir 4 puffs every 4-6 hours as needed or albuterol nebulizer one vial puffs every 4-6 hours as needed - Asthma control goals:  * Full participation in all desired activities (may need albuterol before activity) * Albuterol use two time or less a week on average (not counting use with activity) * Cough interfering with sleep two time or less a month * Oral steroids no more than once a year * No hospitalizations  2. Allergic rhinitis - on allergen immunotherapy (prescription altered April 2018) - Continue with allergy shots at the same schedule. - Try weaning off of the nose sprays as tolerated, but we will start in the winter once all of the pollen counts are down.  - Continue with Flonase two sprays per nostril daily. - Continue with Patanase two sprays per nostril 1-2 times daily. - Continue with the antihistamine (Zyrtec or Allegra) as needed.   3. GERD  - Continue with Dexilant 60mg  twice daily.  4. Return in about 6 months (around 07/30/2020). This can be an in-person, a virtual Webex or a telephone follow up  visit.  Subjective:   Joanna [name] is a 54 y.o. female presenting today for follow up of  Chief Complaint  Patient presents with  . Allergic Rhinitis   . Asthma  . Gastroesophageal Reflux    Joanna [name] has a history of the following: Patient Active Problem List   Diagnosis Date Noted  . Moderate persistent asthma without complication 123XX123  . Seasonal and perennial allergic rhinitis 04/16/2017  . Impingement syndrome of both shoulders 11/17/2016  . History of insect sting allergy 06/03/2016  . Thrush 06/03/2016  . 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  . Primary immune deficiency disorder 05/15/2016  . Immunodeficiency ([place]) 05/15/2016  . Allergic rhinitis due to pollen 05/15/2016  . Insect sting allergy, current reaction 05/15/2016  . Severe persistent asthma 05/12/2016  . 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 Essential hypertension 05/12/2016  . Gastroesophageal reflux disease 05/12/2016  . Status post below knee amputation of right lower extremity ([place]) 05/12/2016    History obtained from: chart review and patient.  Joanna [name] is a 54 y.o. female presenting for a follow up visit.  She was last seen in October 2020.  At that time, her lung function look stable.  We did not make any changes.  We continued her on Fasenra every 8 weeks and Spiriva 2 puffs once daily with albuterol as needed.  For her allergen immunotherapy, we continue with her allergy shots at the same schedule.  We did recommend weaning off the nose sprays as tolerated.  We continued with Flonase, Patanase, and a daily antihistamine.  Since last visit, she has mostly done well. She id having her  second Desha vaccination on Saturday and she is very excited about it. She was getting them at the [facility] site in [place].   Asthma/Respiratory Symptom History: She remains on the Spiriva once daily. She does need a refill on her rescue inhaler. She has used it twice this month at  the most. Sometimes, she will wake up and she feels that she cannot breathe. She wakes up coughing wheezing. This has been going on for a period of one year off and on. This is 1-2 times per month at the most. She does not think that this is related to her reflux medication.   Allergic Rhinitis Symptom History: She remains on the nasal sprays. She is doing her shots without a problem. She feels that these have helped to improve her asthma and allergy control. She has not needed antibiotics in quite some time.   Sharonis on allergen immunotherapy. Shereceives two injections. Immunotherapy script #1contains molds and dust mites.Shecurrently receives 0.36mLof the RED vial (1/100). Immunotherapy script #2 containstrees, weeds, grasses, cat and dog.Shecurrently receives 0.41mLof the RED vial (1/100).Shestarted shots Octoberof 2017and reached [facility] 2018.She has had no large local reactions since we slowed down her advance.  Vials were re-mixed in April 2018 due to recurrent reactions.    GERD is under good control with the Dexilant BID. This is controlling her symptoms well. She does not use Tums or other GERD rescue medications often.   Otherwise, there have been no changes to her past medical history, surgical history, family history, or social history.    Review of Systems  Constitutional: Negative.  Negative for chills, fever, malaise/fatigue and weight loss.  HENT: Negative.  Negative for congestion, ear discharge and ear pain.   Eyes: Negative for pain, discharge and redness.  Respiratory: Negative for cough, sputum production, shortness of breath and wheezing.   Cardiovascular: Negative.  Negative for chest pain and palpitations.  Gastrointestinal: Negative for abdominal pain, constipation, diarrhea, heartburn, nausea and vomiting.  Skin: Negative.  Negative for itching and rash.  Neurological: Negative for dizziness and headaches.  Endo/Heme/Allergies:  Negative for environmental allergies. Does not bruise/bleed easily.       Objective:   Blood pressure 120/76, pulse 72, temperature 98.1 F (36.7 C), temperature source Oral, resp. rate 16, height 5\' 2"  (1.575 m), weight 240 lb (108.9 kg), SpO2 96 %. Body mass index is 43.9 kg/m.   Physical Exam:  Physical Exam  Constitutional: She appears well-developed.  Very pleasant female. Cooperative with the exam.   HENT:  Head: Normocephalic and atraumatic.  Right Ear: Tympanic membrane, external ear and ear canal normal.  Left Ear: Tympanic membrane, external ear and ear canal normal.  Nose: Mucosal edema and rhinorrhea present. No nasal deformity or septal deviation. No epistaxis. Right sinus exhibits no maxillary sinus tenderness and no frontal sinus tenderness. Left sinus exhibits no maxillary sinus tenderness and no frontal sinus tenderness.  Mouth/Throat: Uvula is midline and oropharynx is clear and moist. Mucous membranes are not pale and not dry.  Tonsils normally sized bilaterally without exudates.   Eyes: Pupils are equal, round, and reactive to light. Conjunctivae and EOM are normal. Right eye exhibits no chemosis and no discharge. Left eye exhibits no chemosis and no discharge. Right conjunctiva is not injected. Left conjunctiva is not injected.  Cardiovascular: Normal rate, regular rhythm and normal heart sounds.  Respiratory: Effort normal and breath sounds normal. No accessory muscle usage. No tachypnea. No respiratory distress. She has no wheezes. She has no  rhonchi. She has no rales. She exhibits no tenderness.  Moving air well in all lung fields. No increased work of breathing noted.   Lymphadenopathy:    She has no cervical adenopathy.  Neurological: She is alert.  Skin: No abrasion, no petechiae and no rash noted. Rash is not papular, not vesicular and not urticarial. No erythema. No pallor.  No eczematous or urticarial lesions noted.   Psychiatric: She has a normal mood  and affect.     Diagnostic studies:    Spirometry: results normal (FEV1: 2.14/87%, FVC: 2.45/78%, FEV1/FVC: 87%).    Spirometry consistent with normal pattern.   Allergy Studies: none       Salvatore Marvel, MD  [facility] and [facility]

## 2020-01-29 NOTE — Patient Instructions (Addendum)
1. Severe persistent asthma, uncomplicated  - Lung function looked stable today.   - We are not going to make any changes since you are doing so well.  - Daily controller medication(s): Fasenra every 8 weeks and Spiriva 1.71mcg two puffs once daily - Prior to physical activity: ProAir 2 puffs 10-15 minutes before physical activity. - Rescue medications: ProAir 4 puffs every 4-6 hours as needed or albuterol nebulizer one vial puffs every 4-6 hours as needed - Asthma control goals:  * Full participation in all desired activities (may need albuterol before activity) * Albuterol use two time or less a week on average (not counting use with activity) * Cough interfering with sleep two time or less a month * Oral steroids no more than once a year * No hospitalizations  2. Allergic rhinitis - on allergen immunotherapy (prescription altered April 2018) - Continue with allergy shots at the same schedule. - Try weaning off of the nose sprays as tolerated, but we will start in the winter once all of the pollen counts are down.  - Continue with Flonase two sprays per nostril daily. - Continue with Patanase two sprays per nostril 1-2 times daily. - Continue with the antihistamine (Zyrtec or Allegra) as needed.   3. GERD  - Continue with Dexilant 60mg  twice daily.  4. Return in about 6 months (around 07/30/2020). This can be an in-person, a virtual Webex or a telephone follow up visit.   Please inform us of any Emergency Department visits, hospitalizations, or changes in symptoms. Call us before going to the ED for breathing or allergy symptoms since we might be able to fit you in for a sick visit. Feel free to contact us anytime with any questions, problems, or concerns.  It was a pleasure to see you again today!  Websites that have reliable patient information: 1. American Academy of Asthma, Allergy, and Immunology: www.aaaai.org 2. Food Allergy Research and Education (FARE): foodallergy.org 3.  Mothers of Asthmatics: http://www.asthmacommunitynetwork.org 4. American College of Allergy, Asthma, and Immunology: www.acaai.org   COVID-19 Vaccine Information can be found at: ShippingScam.co.uk For questions related to vaccine distribution or appointments, please email vaccine@[email or web address] [email or web address] or call [phone].     "Like" [place] on Facebook and Instagram for our latest updates!       HAPPY SPRING!  Make sure you are registered to vote! If you have moved or changed any of your contact information, you will need to get this updated before voting!  In some cases, you MAY be able to register to vote online: [email or web address]

## 2020-01-30 ENCOUNTER — Encounter: Payer: Self-pay | Admitting: Allergy & Immunology

## 2020-02-20 ENCOUNTER — Ambulatory Visit (INDEPENDENT_AMBULATORY_CARE_PROVIDER_SITE_OTHER): Payer: Medicare HMO

## 2020-02-20 DIAGNOSIS — J309 Allergic rhinitis, unspecified: Secondary | ICD-10-CM

## 2020-02-28 ENCOUNTER — Ambulatory Visit (INDEPENDENT_AMBULATORY_CARE_PROVIDER_SITE_OTHER): Payer: Medicare HMO

## 2020-02-28 DIAGNOSIS — J309 Allergic rhinitis, unspecified: Secondary | ICD-10-CM

## 2020-02-29 ENCOUNTER — Ambulatory Visit: Payer: Self-pay

## 2020-03-01 ENCOUNTER — Other Ambulatory Visit: Payer: Self-pay

## 2020-03-01 ENCOUNTER — Ambulatory Visit (INDEPENDENT_AMBULATORY_CARE_PROVIDER_SITE_OTHER): Payer: Medicare HMO

## 2020-03-01 DIAGNOSIS — J4550 Severe persistent asthma, uncomplicated: Secondary | ICD-10-CM

## 2020-03-07 ENCOUNTER — Ambulatory Visit (INDEPENDENT_AMBULATORY_CARE_PROVIDER_SITE_OTHER): Payer: Medicare HMO

## 2020-03-07 DIAGNOSIS — J309 Allergic rhinitis, unspecified: Secondary | ICD-10-CM | POA: Diagnosis not present

## 2020-03-14 ENCOUNTER — Ambulatory Visit (INDEPENDENT_AMBULATORY_CARE_PROVIDER_SITE_OTHER): Payer: Medicare HMO

## 2020-03-14 DIAGNOSIS — J309 Allergic rhinitis, unspecified: Secondary | ICD-10-CM | POA: Diagnosis not present

## 2020-03-21 ENCOUNTER — Ambulatory Visit (INDEPENDENT_AMBULATORY_CARE_PROVIDER_SITE_OTHER): Payer: Medicare HMO

## 2020-03-21 DIAGNOSIS — J309 Allergic rhinitis, unspecified: Secondary | ICD-10-CM | POA: Diagnosis not present

## 2020-04-09 ENCOUNTER — Ambulatory Visit (INDEPENDENT_AMBULATORY_CARE_PROVIDER_SITE_OTHER): Payer: Medicare HMO

## 2020-04-09 DIAGNOSIS — J309 Allergic rhinitis, unspecified: Secondary | ICD-10-CM | POA: Diagnosis not present

## 2020-04-26 ENCOUNTER — Ambulatory Visit: Payer: Self-pay

## 2020-04-30 ENCOUNTER — Ambulatory Visit (INDEPENDENT_AMBULATORY_CARE_PROVIDER_SITE_OTHER): Payer: Medicare HMO

## 2020-04-30 ENCOUNTER — Telehealth: Payer: Self-pay

## 2020-04-30 DIAGNOSIS — J4550 Severe persistent asthma, uncomplicated: Secondary | ICD-10-CM

## 2020-04-30 MED ORDER — DEXILANT 60 MG PO CPDR: 60.0000 mg | DELAYED_RELEASE_CAPSULE | Freq: Two times a day (BID) | ORAL | 1 refills | Status: DC

## 2020-04-30 NOTE — Telephone Encounter (Signed)
Temp supply with additional refill in event pharmacy is still out. Patient is aware.

## 2020-04-30 NOTE — Telephone Encounter (Signed)
Patients pharmacy [facility] on [address] in [place] called requesting the patients refill be temporarily changed due to the mail order pharmacy being out of [place].   The prescription will need to be a 30 day supply, Quantity 60 as the patient takes the medication 2x/day.   Please Advise

## 2020-05-02 ENCOUNTER — Ambulatory Visit (INDEPENDENT_AMBULATORY_CARE_PROVIDER_SITE_OTHER): Payer: Medicare HMO

## 2020-05-02 DIAGNOSIS — J309 Allergic rhinitis, unspecified: Secondary | ICD-10-CM | POA: Diagnosis not present

## 2020-05-02 MED ORDER — DEXILANT 60 MG PO CPDR: 60.0000 mg | DELAYED_RELEASE_CAPSULE | Freq: Two times a day (BID) | ORAL | 1 refills | Status: DC

## 2020-05-02 NOTE — Addendum Note (Signed)
Addended by: Lucrezia Starch I on: 05/02/2020 10:51 AM   Modules accepted: Orders

## 2020-05-06 NOTE — Progress Notes (Signed)
Vials exp 05-06-21 

## 2020-05-09 DIAGNOSIS — J3089 Other allergic rhinitis: Secondary | ICD-10-CM

## 2020-05-13 DIAGNOSIS — J3081 Allergic rhinitis due to animal (cat) (dog) hair and dander: Secondary | ICD-10-CM

## 2020-05-20 ENCOUNTER — Ambulatory Visit (INDEPENDENT_AMBULATORY_CARE_PROVIDER_SITE_OTHER): Payer: Medicare HMO

## 2020-05-20 DIAGNOSIS — J309 Allergic rhinitis, unspecified: Secondary | ICD-10-CM

## 2020-05-28 ENCOUNTER — Telehealth: Payer: Self-pay | Admitting: Orthopedic Surgery

## 2020-05-28 NOTE — Telephone Encounter (Signed)
Pt would a referral for new liners (inner and outer) faxed to [facility].   [facility] Fax# [phone]

## 2020-05-28 NOTE — Telephone Encounter (Signed)
Can you please call pt and make an appt? She has not been in the office since 09/16/18 and insurance requires that she have eval in the office with in the year

## 2020-05-29 ENCOUNTER — Encounter: Payer: Self-pay | Admitting: Physician Assistant

## 2020-05-29 ENCOUNTER — Ambulatory Visit: Payer: Medicare HMO | Admitting: Physician Assistant

## 2020-05-29 DIAGNOSIS — Z89511 Acquired absence of right leg below knee: Secondary | ICD-10-CM
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
## 2020-06-10 ENCOUNTER — Ambulatory Visit (INDEPENDENT_AMBULATORY_CARE_PROVIDER_SITE_OTHER): Payer: Medicare HMO

## 2020-06-10 DIAGNOSIS — J309 Allergic rhinitis, unspecified: Secondary | ICD-10-CM

## 2020-06-25 ENCOUNTER — Other Ambulatory Visit: Payer: Self-pay

## 2020-06-25 ENCOUNTER — Ambulatory Visit (INDEPENDENT_AMBULATORY_CARE_PROVIDER_SITE_OTHER): Payer: Medicare HMO

## 2020-06-25 DIAGNOSIS — J4550 Severe persistent asthma, uncomplicated: Secondary | ICD-10-CM | POA: Diagnosis not present

## 2020-07-02 ENCOUNTER — Ambulatory Visit (INDEPENDENT_AMBULATORY_CARE_PROVIDER_SITE_OTHER): Payer: Medicare HMO

## 2020-07-02 DIAGNOSIS — J309 Allergic rhinitis, unspecified: Secondary | ICD-10-CM | POA: Diagnosis not present

## 2020-07-10 ENCOUNTER — Telehealth: Payer: Self-pay | Admitting: Allergy & Immunology

## 2020-07-10 NOTE — Telephone Encounter (Signed)
Mickel Baas, nurse from [facility] is with patient for her pre op exam. Requesting last PFT from 01/29/2020 to be faxed to [phone] Attn: Mickel Baas

## 2020-07-10 NOTE — Telephone Encounter (Signed)
fax'd Joanna [name]'s PFT to Mickel Baas at [facility].

## 2020-07-17 ENCOUNTER — Ambulatory Visit (INDEPENDENT_AMBULATORY_CARE_PROVIDER_SITE_OTHER): Payer: Medicare HMO

## 2020-07-17 DIAGNOSIS — J309 Allergic rhinitis, unspecified: Secondary | ICD-10-CM

## 2020-07-29 ENCOUNTER — Ambulatory Visit: Payer: Medicare HMO | Admitting: Allergy & Immunology

## 2020-07-29 ENCOUNTER — Telehealth: Payer: Self-pay | Admitting: Allergy & Immunology

## 2020-07-29 ENCOUNTER — Other Ambulatory Visit: Payer: Self-pay

## 2020-07-29 MED ORDER — ALBUTEROL SULFATE (2.5 MG/3ML) 0.083% IN NEBU: 2.5000 mg | INHALATION_SOLUTION | RESPIRATORY_TRACT | 2 refills | Status: DC | PRN

## 2020-07-29 NOTE — Telephone Encounter (Signed)
Refill sent in for pt. 

## 2020-07-29 NOTE — Telephone Encounter (Signed)
PT had surgery on 9/27 was diagnosed with pneumonia and asking for albuterol nebulizer solution sent in [facility] on [place].

## 2020-07-30 ENCOUNTER — Telehealth: Payer: Self-pay | Admitting: Allergy & Immunology

## 2020-07-30 NOTE — Telephone Encounter (Signed)
Please advise 

## 2020-07-30 NOTE — Telephone Encounter (Signed)
Pt. Request a new nebulizer.

## 2020-07-31 ENCOUNTER — Telehealth: Payer: Self-pay | Admitting: Allergy & Immunology

## 2020-07-31 NOTE — Telephone Encounter (Signed)
Of course she can have a new nebulizer. Please see how she is doing after her surgery. I did not know that she had surgery.  Salvatore Marvel, MD [facility]

## 2020-07-31 NOTE — Telephone Encounter (Signed)
Pt. Called husband was able to fix her nebulizer, request not needed.

## 2020-07-31 NOTE — Telephone Encounter (Signed)
Ok thanks 

## 2020-07-31 NOTE — Telephone Encounter (Signed)
Pt. States her husband was able to get her nebulizer working and does not need the requested one.

## 2020-08-05 ENCOUNTER — Other Ambulatory Visit: Payer: Self-pay

## 2020-08-05 ENCOUNTER — Encounter: Payer: Self-pay | Admitting: Physical Therapy

## 2020-08-05 ENCOUNTER — Ambulatory Visit: Payer: Medicare HMO | Attending: Orthopaedic Surgery | Admitting: Physical Therapy

## 2020-08-05 DIAGNOSIS — R2689 Other abnormalities of gait and mobility: Secondary | ICD-10-CM | POA: Diagnosis present

## 2020-08-05 DIAGNOSIS — M25662 Stiffness of left knee, not elsewhere classified: Secondary | ICD-10-CM | POA: Insufficient documentation

## 2020-08-05 DIAGNOSIS — R262 Difficulty in walking, not elsewhere classified: Secondary | ICD-10-CM

## 2020-08-05 DIAGNOSIS — M6281 Muscle weakness (generalized): Secondary | ICD-10-CM | POA: Insufficient documentation

## 2020-08-05 DIAGNOSIS — M25562 Pain in left knee: Secondary | ICD-10-CM | POA: Insufficient documentation
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
## 2020-08-05 NOTE — Patient Instructions (Signed)
    Home exercise program created by Aashrith Eves, PT.  For questions, please contact Vincy Feliz via phone at [phone] or email at [email or web address]  [facility] [place] [address]  [address] [place], [place], [address] Phone: [phone]   Fax:  [phone]    

## 2020-08-07 ENCOUNTER — Other Ambulatory Visit: Payer: Self-pay

## 2020-08-07 ENCOUNTER — Ambulatory Visit: Payer: Medicare HMO

## 2020-08-07 DIAGNOSIS — M6281 Muscle weakness (generalized): Secondary | ICD-10-CM

## 2020-08-07 DIAGNOSIS — M25562 Pain in left knee: Secondary | ICD-10-CM

## 2020-08-07 DIAGNOSIS — M25662 Stiffness of left knee, not elsewhere classified: Secondary | ICD-10-CM

## 2020-08-07 DIAGNOSIS — R2689 Other abnormalities of gait and mobility: Secondary | ICD-10-CM

## 2020-08-07 DIAGNOSIS — R262 Difficulty in walking, not elsewhere classified: Secondary | ICD-10-CM
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
## 2020-08-08 ENCOUNTER — Ambulatory Visit (INDEPENDENT_AMBULATORY_CARE_PROVIDER_SITE_OTHER): Payer: Medicare HMO

## 2020-08-08 DIAGNOSIS — J309 Allergic rhinitis, unspecified: Secondary | ICD-10-CM

## 2020-08-09 ENCOUNTER — Other Ambulatory Visit: Payer: Self-pay

## 2020-08-09 ENCOUNTER — Ambulatory Visit: Payer: Medicare HMO

## 2020-08-09 DIAGNOSIS — M25662 Stiffness of left knee, not elsewhere classified: Secondary | ICD-10-CM | POA: Diagnosis not present

## 2020-08-09 DIAGNOSIS — M25562 Pain in left knee: Secondary | ICD-10-CM

## 2020-08-09 DIAGNOSIS — R262 Difficulty in walking, not elsewhere classified: Secondary | ICD-10-CM

## 2020-08-09 DIAGNOSIS — M6281 Muscle weakness (generalized): Secondary | ICD-10-CM

## 2020-08-09 DIAGNOSIS — R2689 Other abnormalities of gait and mobility: Secondary | ICD-10-CM
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
## 2020-08-12 ENCOUNTER — Other Ambulatory Visit: Payer: Self-pay

## 2020-08-12 ENCOUNTER — Encounter: Payer: Self-pay | Admitting: Physical Therapy

## 2020-08-12 ENCOUNTER — Ambulatory Visit: Payer: Medicare HMO | Admitting: Physical Therapy

## 2020-08-12 DIAGNOSIS — R2689 Other abnormalities of gait and mobility: Secondary | ICD-10-CM

## 2020-08-12 DIAGNOSIS — M25662 Stiffness of left knee, not elsewhere classified: Secondary | ICD-10-CM

## 2020-08-12 DIAGNOSIS — M6281 Muscle weakness (generalized): Secondary | ICD-10-CM

## 2020-08-12 DIAGNOSIS — M25562 Pain in left knee: Secondary | ICD-10-CM

## 2020-08-12 DIAGNOSIS — R262 Difficulty in walking, not elsewhere classified: Secondary | ICD-10-CM
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
## 2020-08-14 ENCOUNTER — Encounter: Payer: Self-pay | Admitting: Physical Therapy

## 2020-08-14 ENCOUNTER — Other Ambulatory Visit: Payer: Self-pay

## 2020-08-14 ENCOUNTER — Ambulatory Visit: Payer: Medicare HMO | Admitting: Physical Therapy

## 2020-08-14 DIAGNOSIS — M25662 Stiffness of left knee, not elsewhere classified: Secondary | ICD-10-CM | POA: Diagnosis not present

## 2020-08-14 DIAGNOSIS — M6281 Muscle weakness (generalized): Secondary | ICD-10-CM

## 2020-08-14 DIAGNOSIS — M25562 Pain in left knee: Secondary | ICD-10-CM

## 2020-08-14 DIAGNOSIS — R2689 Other abnormalities of gait and mobility: Secondary | ICD-10-CM

## 2020-08-14 DIAGNOSIS — R262 Difficulty in walking, not elsewhere classified: Secondary | ICD-10-CM
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
## 2020-08-15 ENCOUNTER — Ambulatory Visit (INDEPENDENT_AMBULATORY_CARE_PROVIDER_SITE_OTHER): Payer: Medicare HMO

## 2020-08-15 DIAGNOSIS — J309 Allergic rhinitis, unspecified: Secondary | ICD-10-CM | POA: Diagnosis not present

## 2020-08-16 ENCOUNTER — Other Ambulatory Visit: Payer: Self-pay

## 2020-08-16 ENCOUNTER — Ambulatory Visit: Payer: Medicare HMO

## 2020-08-16 DIAGNOSIS — M25662 Stiffness of left knee, not elsewhere classified: Secondary | ICD-10-CM | POA: Diagnosis not present

## 2020-08-16 DIAGNOSIS — R262 Difficulty in walking, not elsewhere classified: Secondary | ICD-10-CM

## 2020-08-16 DIAGNOSIS — M25562 Pain in left knee: Secondary | ICD-10-CM

## 2020-08-16 DIAGNOSIS — M6281 Muscle weakness (generalized): Secondary | ICD-10-CM

## 2020-08-16 DIAGNOSIS — R2689 Other abnormalities of gait and mobility: Secondary | ICD-10-CM
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
## 2020-08-19 ENCOUNTER — Encounter: Payer: Medicare HMO | Admitting: Physical Therapy

## 2020-08-20 ENCOUNTER — Encounter (HOSPITAL_BASED_OUTPATIENT_CLINIC_OR_DEPARTMENT_OTHER): Payer: Self-pay | Admitting: *Deleted

## 2020-08-20 ENCOUNTER — Ambulatory Visit: Payer: Medicare HMO

## 2020-08-20 ENCOUNTER — Emergency Department (HOSPITAL_BASED_OUTPATIENT_CLINIC_OR_DEPARTMENT_OTHER)
Admission: EM | Admit: 2020-08-20 | Discharge: 2020-08-20 | Disposition: A | Discharge status: Home / Self Care | Payer: Medicare HMO | Attending: Emergency Medicine | Admitting: Emergency Medicine

## 2020-08-20 ENCOUNTER — Emergency Department (HOSPITAL_BASED_OUTPATIENT_CLINIC_OR_DEPARTMENT_OTHER): Payer: Medicare HMO

## 2020-08-20 ENCOUNTER — Other Ambulatory Visit: Payer: Self-pay

## 2020-08-20 DIAGNOSIS — I10 Essential (primary) hypertension: Secondary | ICD-10-CM | POA: Insufficient documentation

## 2020-08-20 DIAGNOSIS — Z7722 Contact with and (suspected) exposure to environmental tobacco smoke (acute) (chronic): Secondary | ICD-10-CM | POA: Diagnosis not present

## 2020-08-20 DIAGNOSIS — M6281 Muscle weakness (generalized): Secondary | ICD-10-CM

## 2020-08-20 DIAGNOSIS — G8918 Other acute postprocedural pain: Secondary | ICD-10-CM | POA: Insufficient documentation

## 2020-08-20 DIAGNOSIS — M25662 Stiffness of left knee, not elsewhere classified: Secondary | ICD-10-CM | POA: Diagnosis not present

## 2020-08-20 DIAGNOSIS — Z79899 Other long term (current) drug therapy: Secondary | ICD-10-CM | POA: Insufficient documentation

## 2020-08-20 DIAGNOSIS — R262 Difficulty in walking, not elsewhere classified: Secondary | ICD-10-CM

## 2020-08-20 DIAGNOSIS — Z7982 Long term (current) use of aspirin: Secondary | ICD-10-CM | POA: Diagnosis not present

## 2020-08-20 DIAGNOSIS — M25562 Pain in left knee: Secondary | ICD-10-CM

## 2020-08-20 DIAGNOSIS — Z96652 Presence of left artificial knee joint: Secondary | ICD-10-CM | POA: Insufficient documentation

## 2020-08-20 DIAGNOSIS — R2689 Other abnormalities of gait and mobility: Secondary | ICD-10-CM

## 2020-08-20 DIAGNOSIS — S76112A Strain of left quadriceps muscle, fascia and tendon, initial encounter: Secondary | ICD-10-CM

## 2020-08-20 DIAGNOSIS — J4550 Severe persistent asthma, uncomplicated: Secondary | ICD-10-CM | POA: Diagnosis not present

## 2020-08-20 DIAGNOSIS — E119 Type 2 diabetes mellitus without complications: Secondary | ICD-10-CM | POA: Diagnosis not present

## 2020-08-20 IMAGING — CR DG FEMUR 2+V*L*
4 series · 4 of 4 positions shown · non-contrast
Comparison: None

CLINICAL DATA: Knee buckled after physical therapy, was assisted to
the ground, prior LEFT knee replacement surgery [DATE]

EXAM:
LEFT FEMUR 2 VIEWS

[t femur with hip  ap left (1 of 2)]
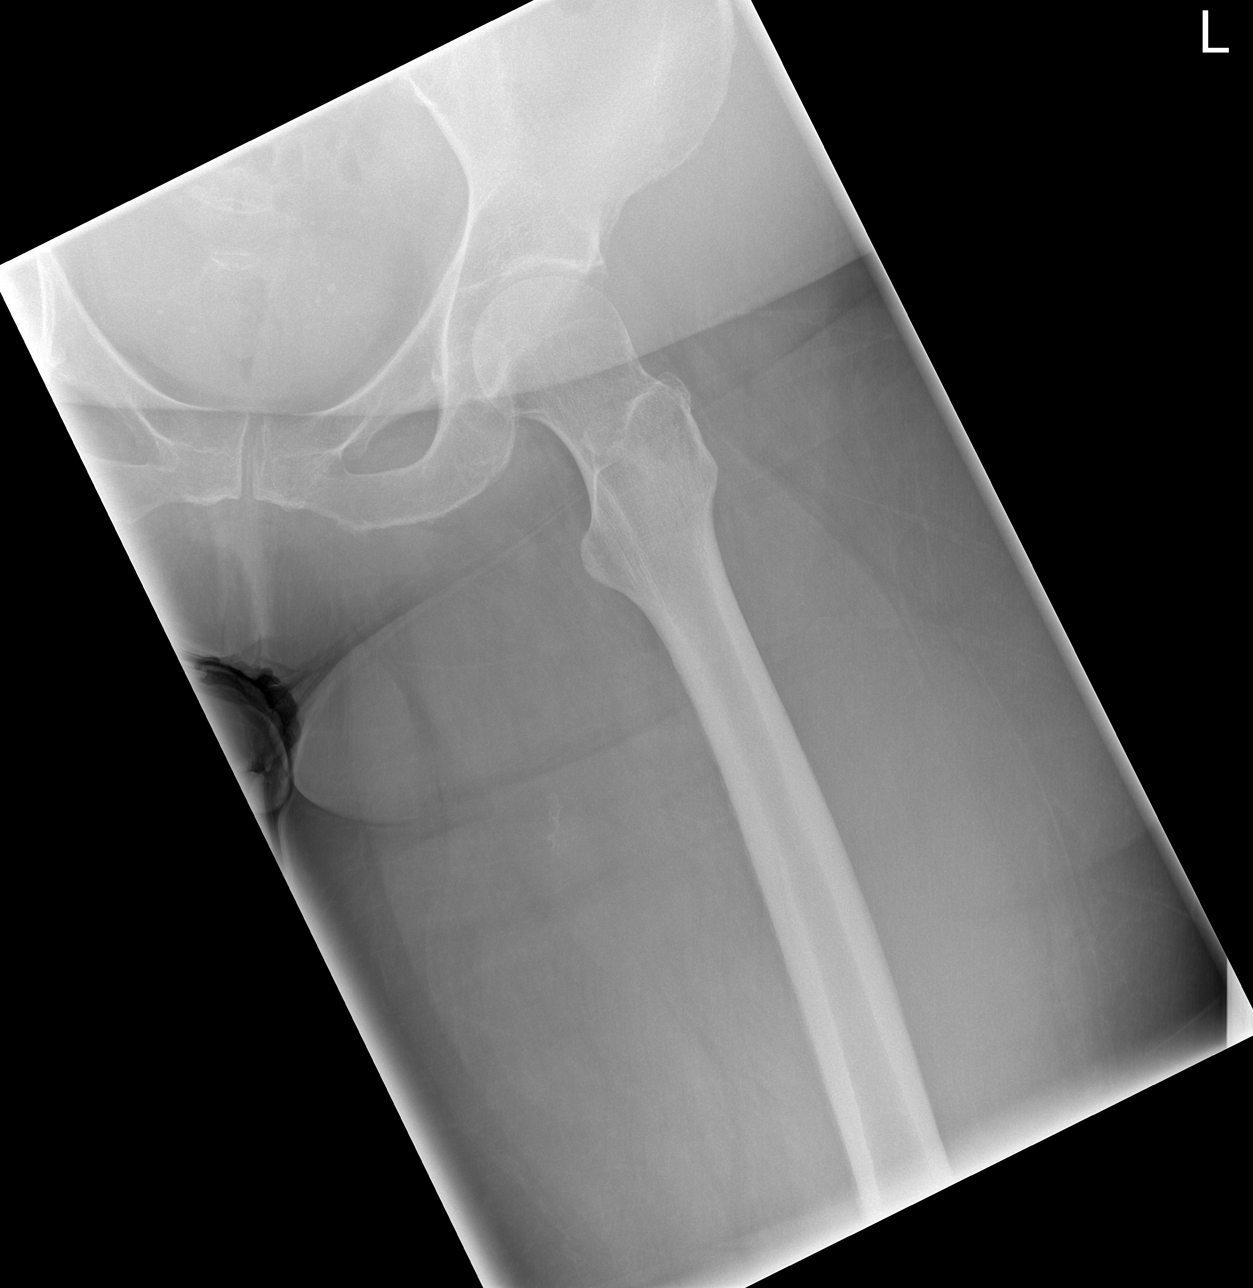

[t femur with knee ap left]
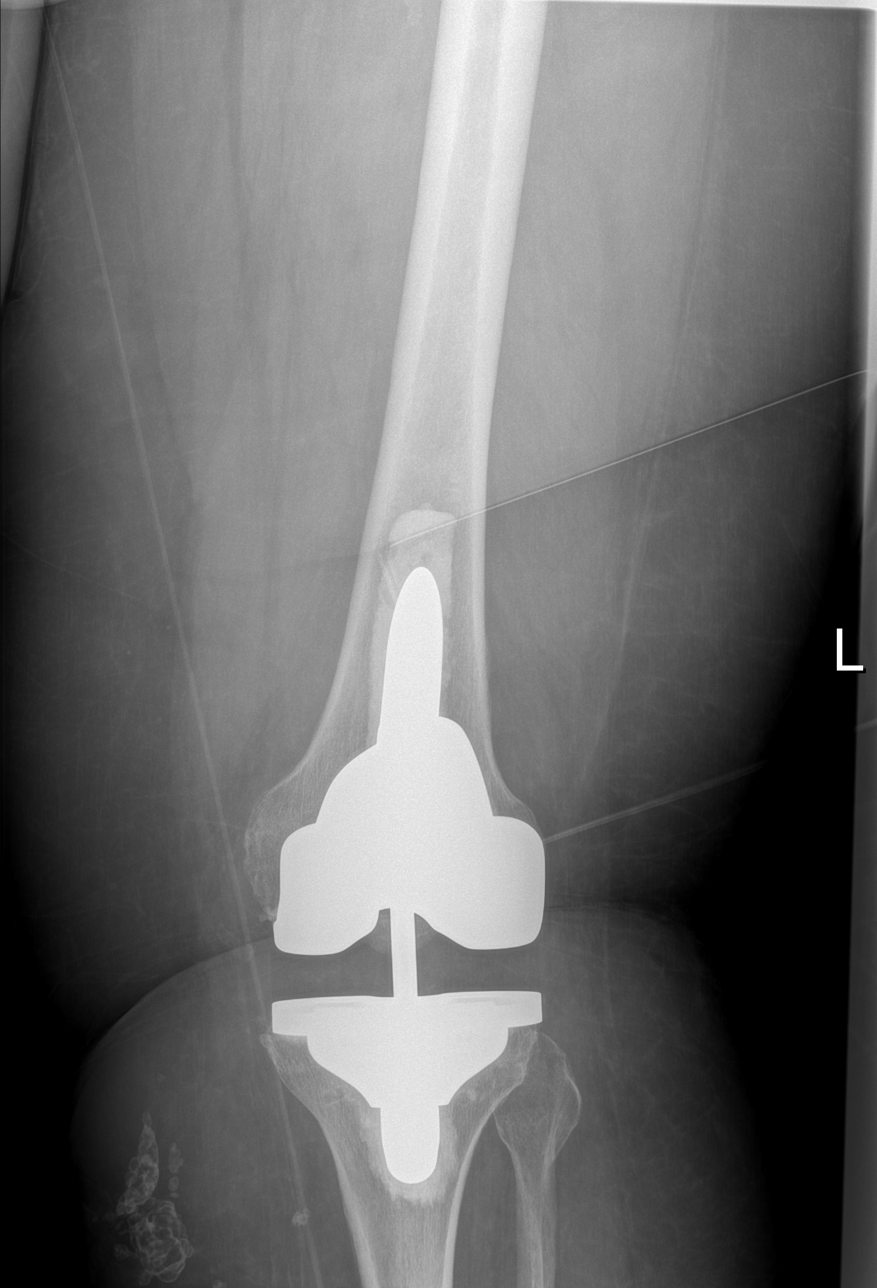

[t femur with hip  ap left (2 of 2)]
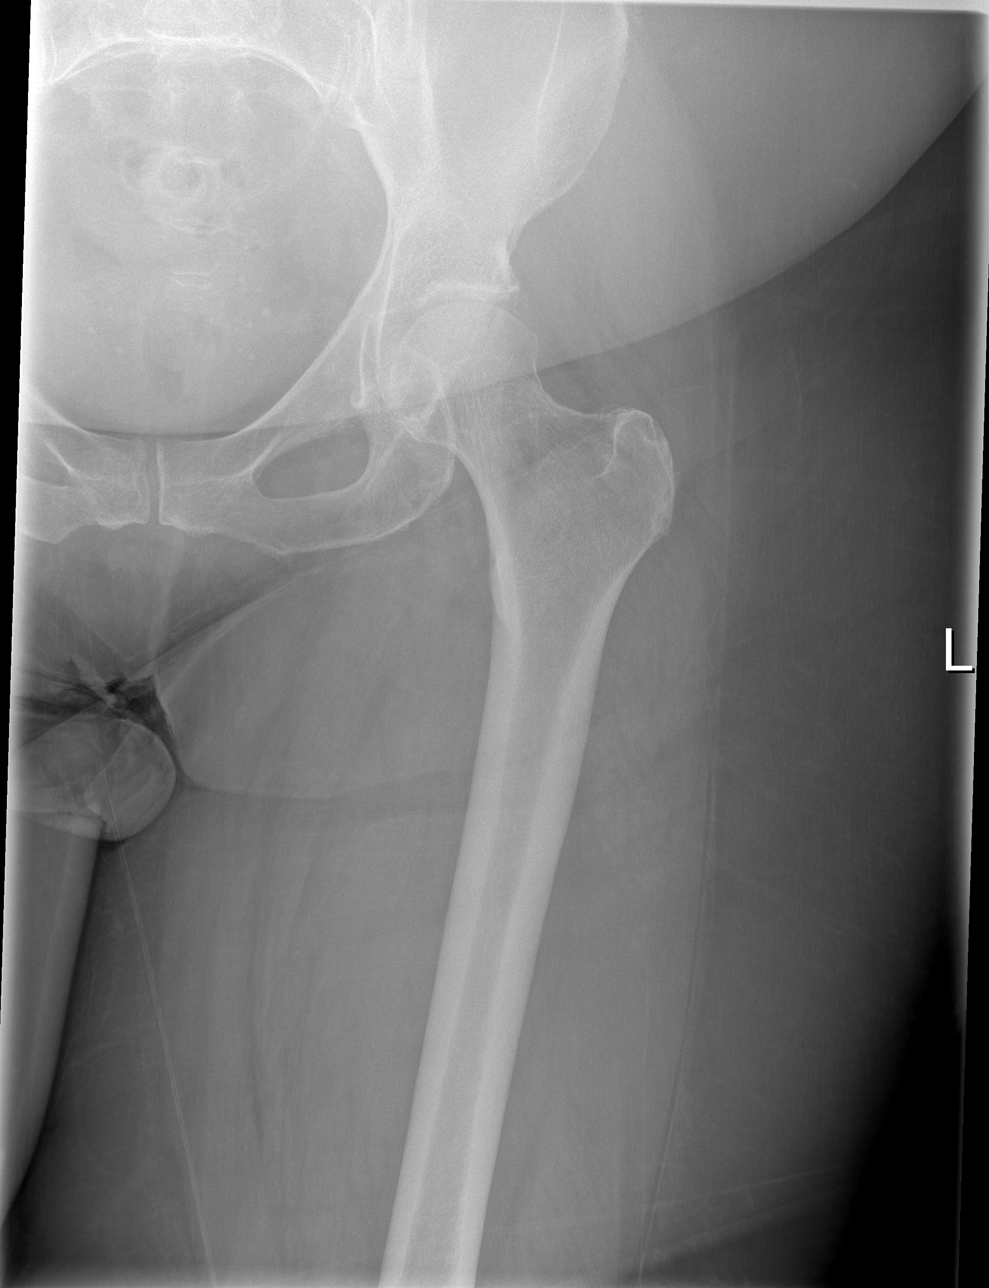

[t femur with knee lat left]
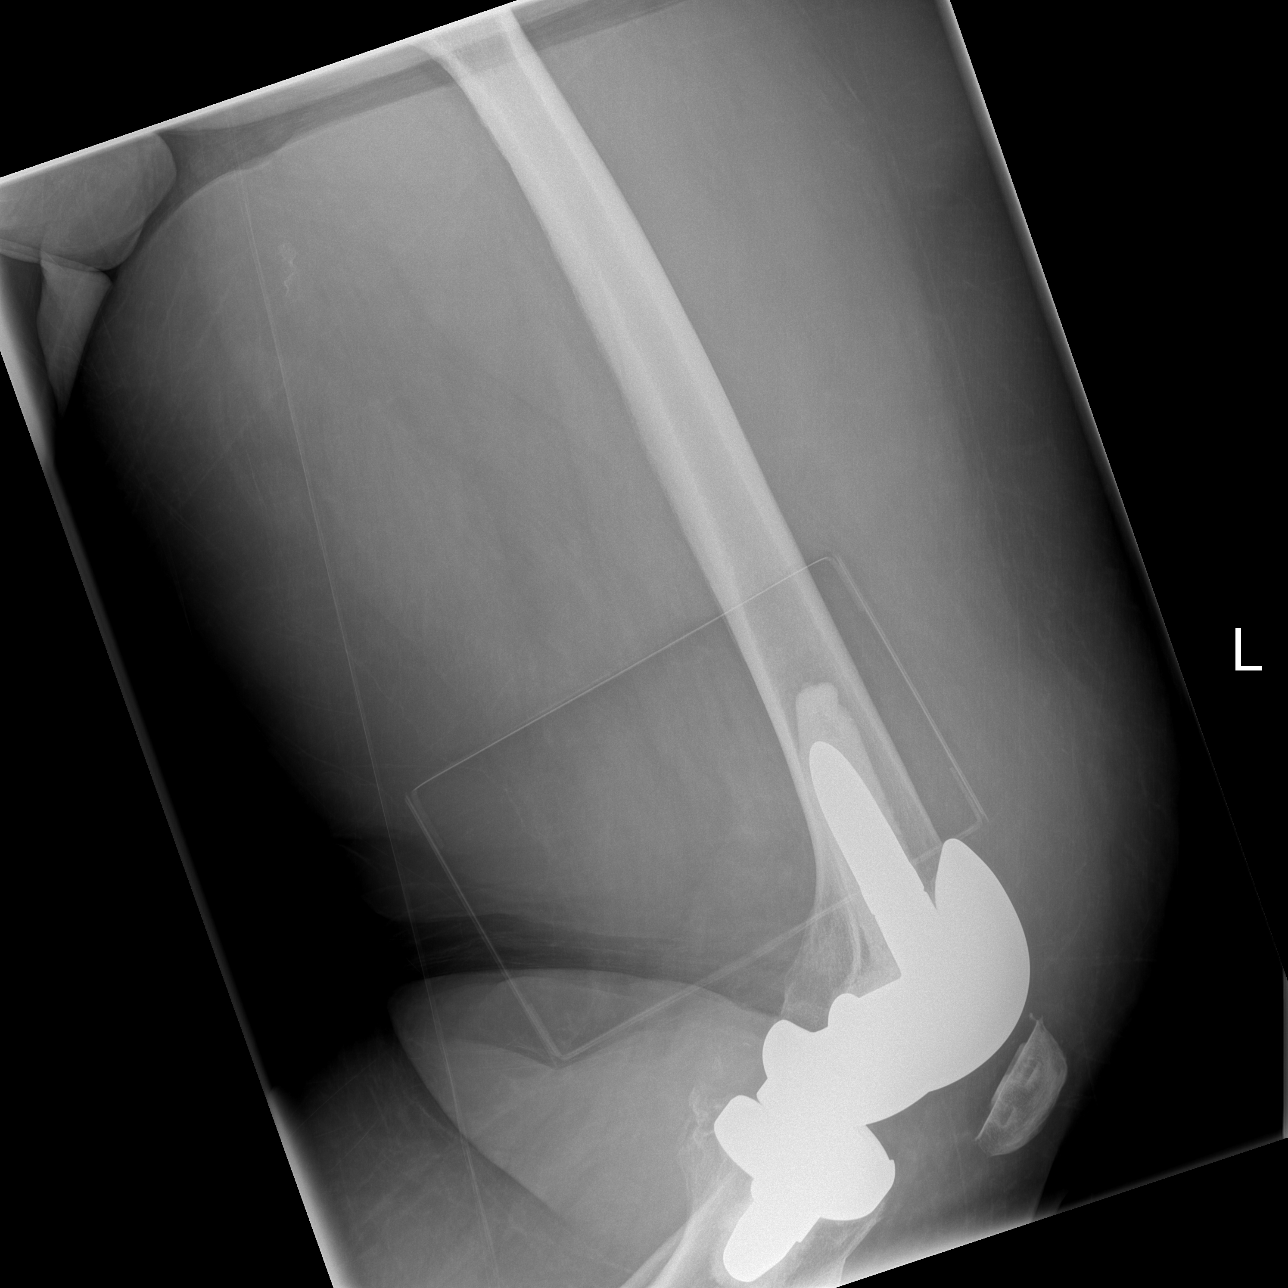

[4 of 4 positions shown; findings below may reference images not displayed]

FINDINGS: Osseous mineralization normal.

Hip joint space preserved.

LEFT hip prosthesis identified.

No acute fracture, dislocation, or bone destruction.
IMPRESSION: No acute abnormalities.

## 2020-08-20 IMAGING — CR DG KNEE COMPLETE 4+V*L*
4 series · 4 of 4 positions shown · non-contrast
Comparison: None

CLINICAL DATA: LEFT leg injury, knee buckled after physical
therapy, was helped to the ground, post LEFT knee replacement
surgery on [DATE]

EXAM:
LEFT KNEE - COMPLETE 4+ VIEW

[t knee oblique left (1 of 2)]
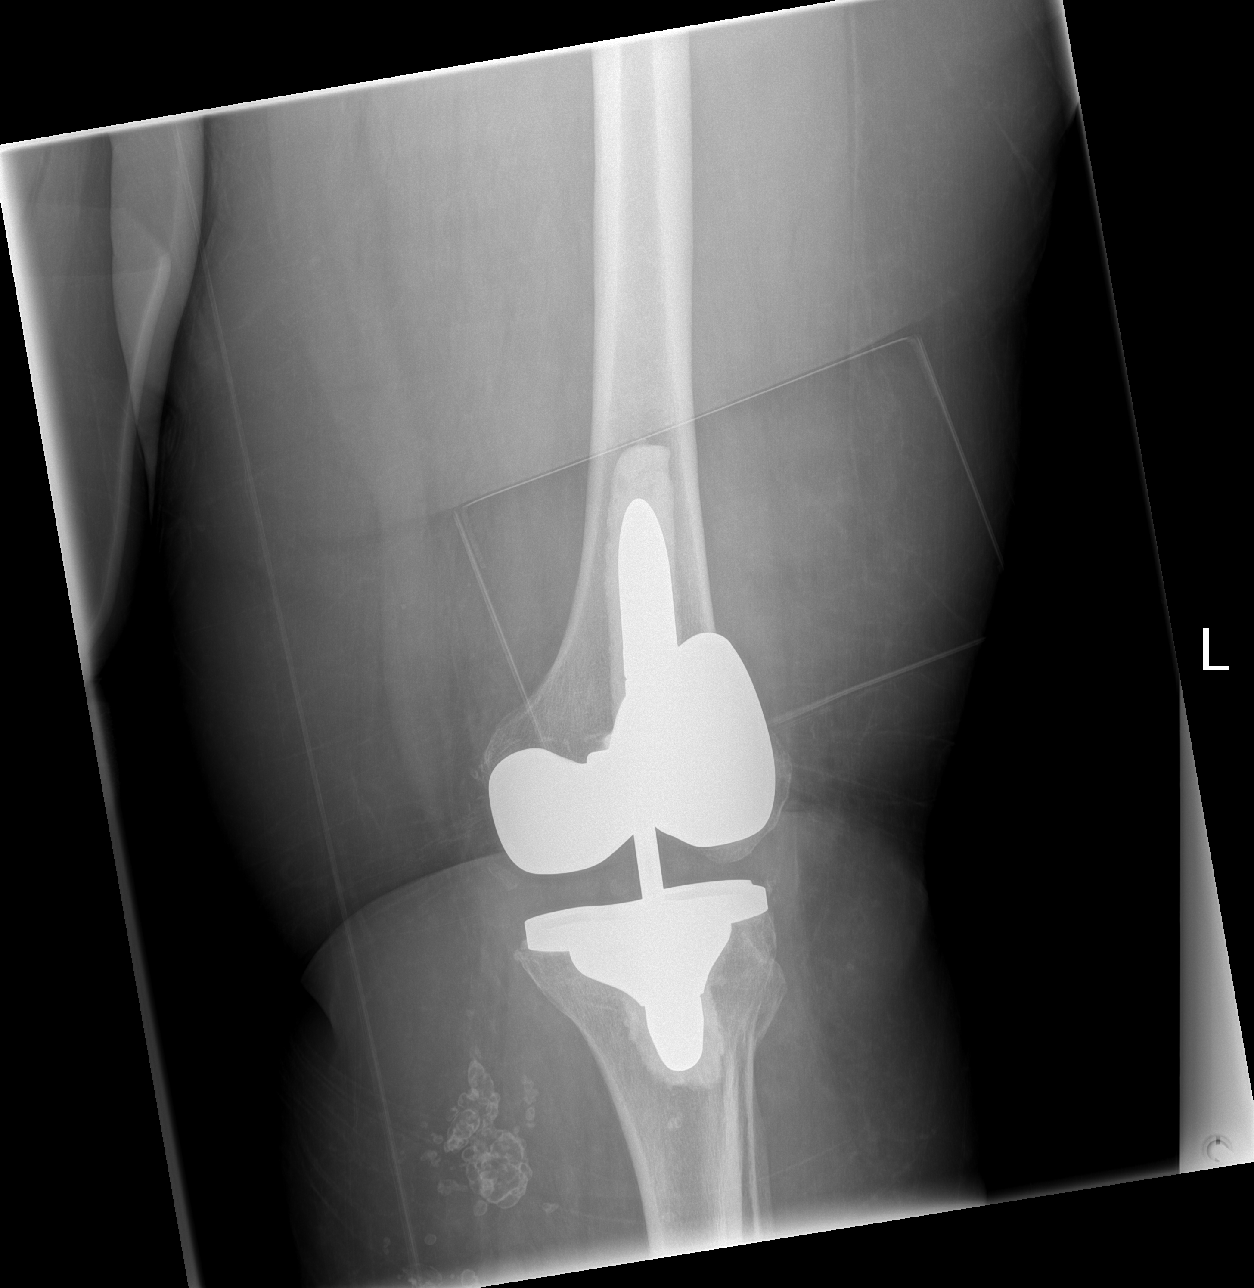

[t knee ap left]
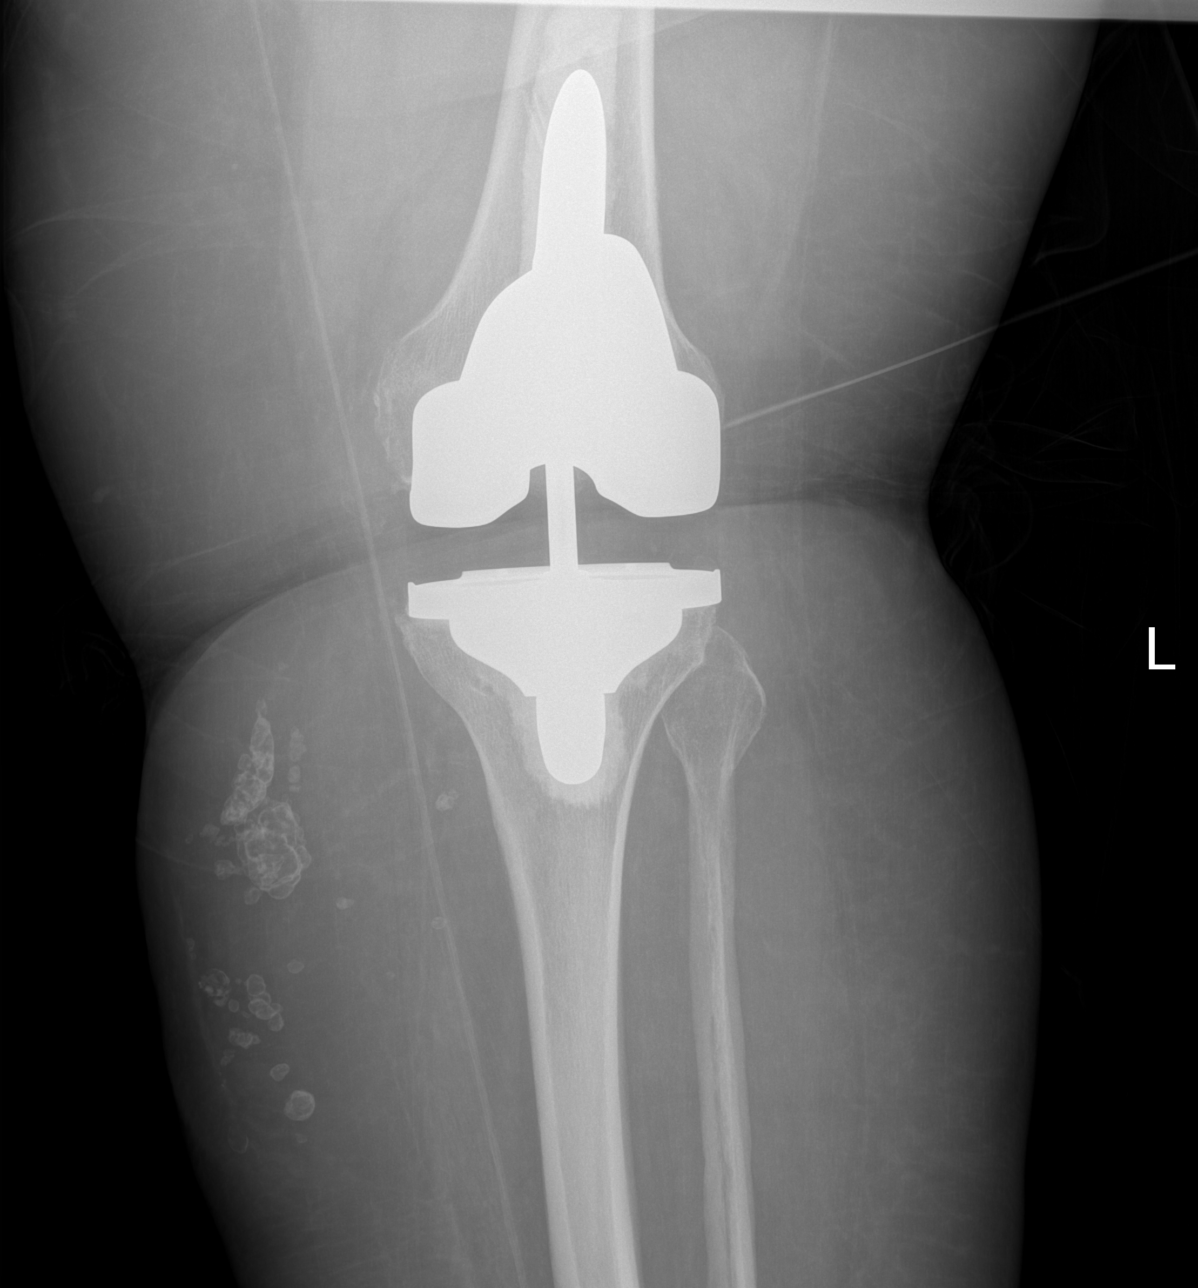

[t knee oblique left (2 of 2)]
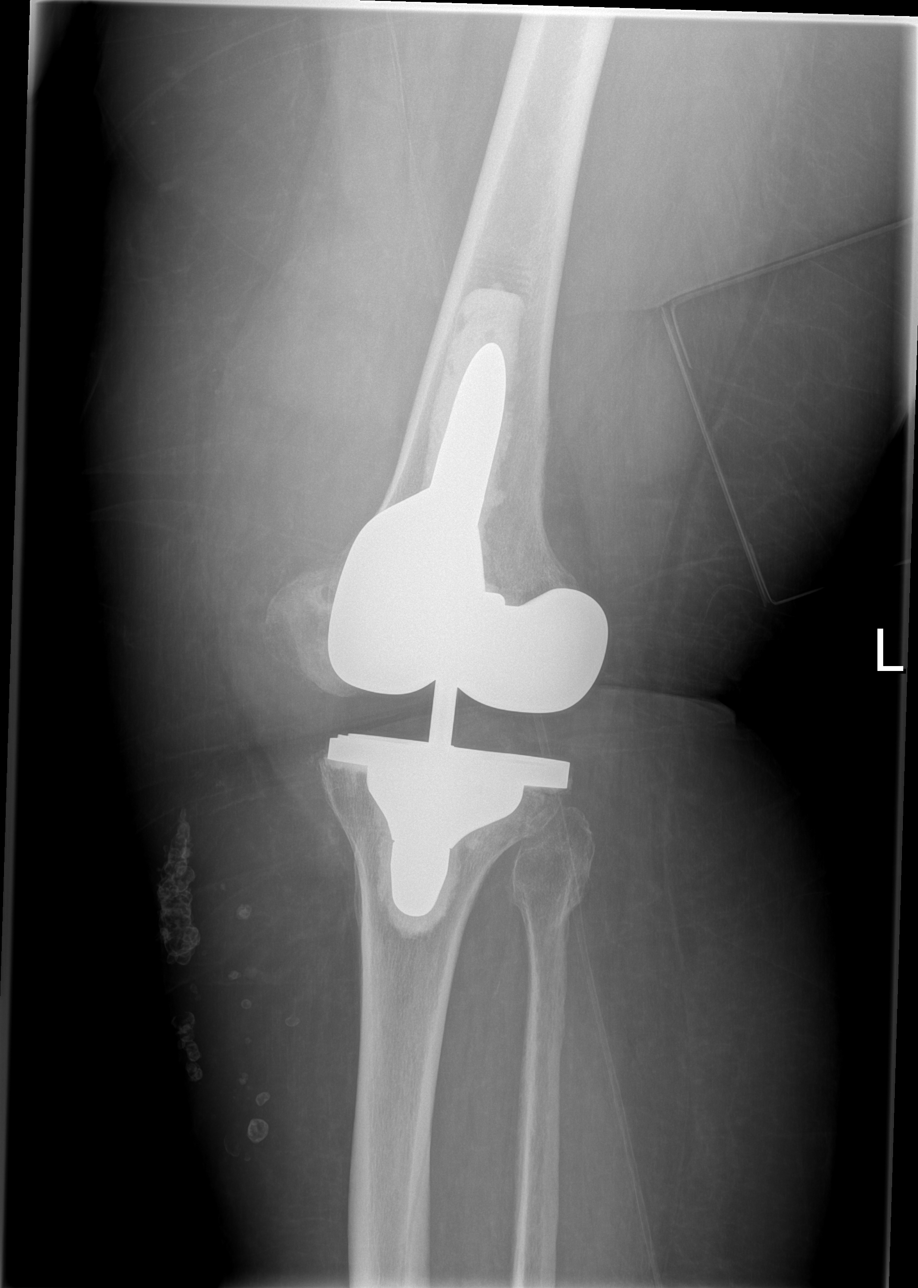

[t knee lat left]
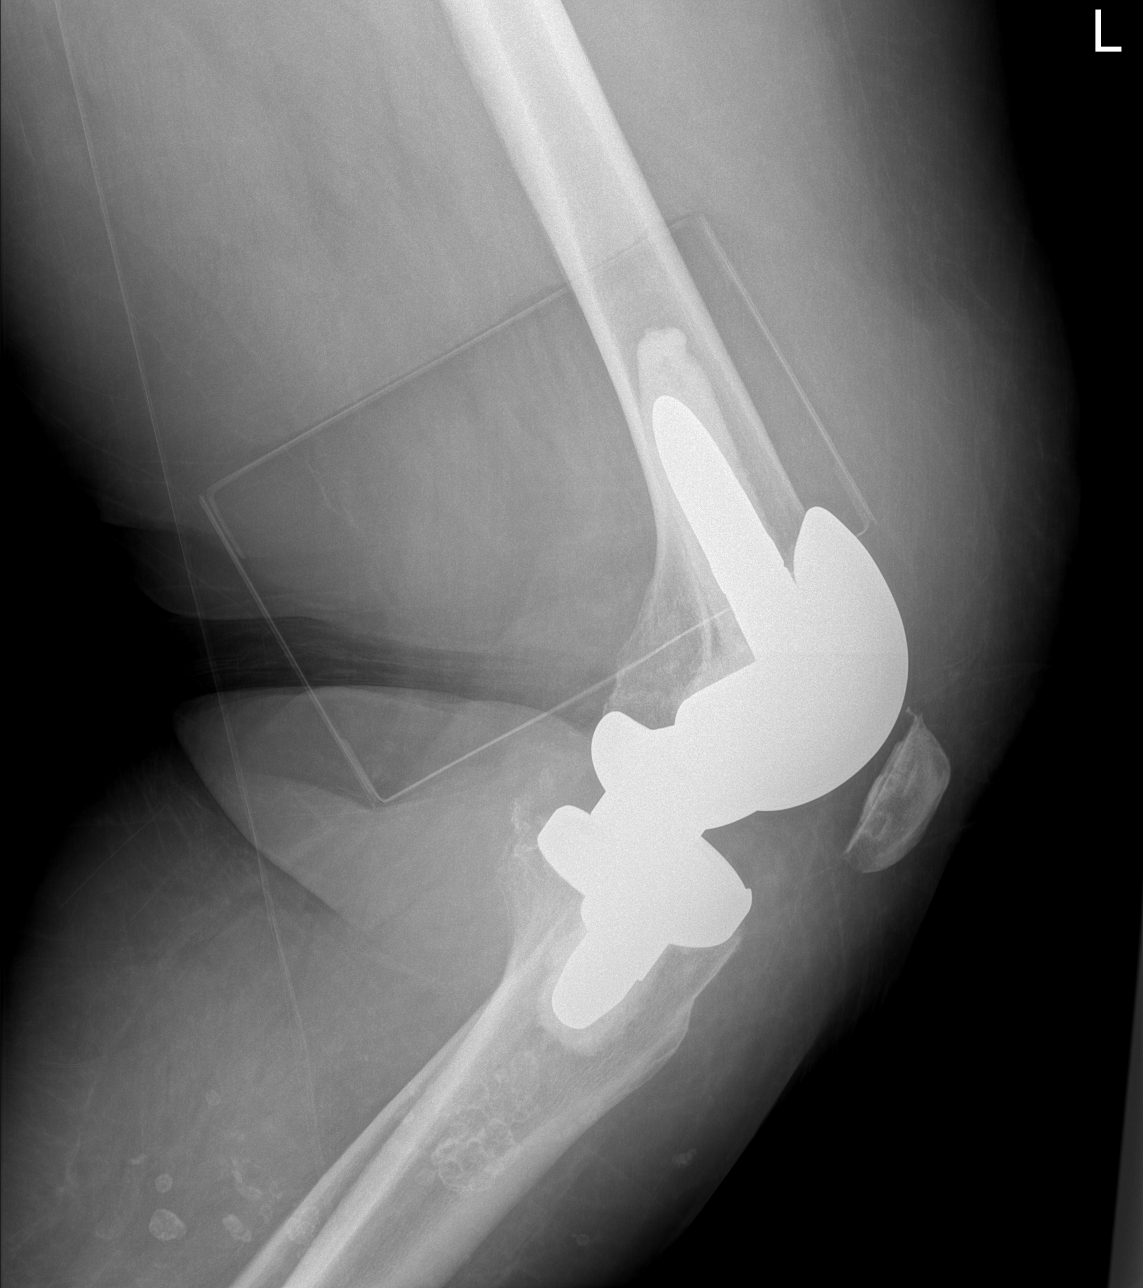

[4 of 4 positions shown; findings below may reference images not displayed]

FINDINGS: Osseous mineralization low normal.

LEFT knee prosthesis identified.

No fracture, dislocation, bone destruction or periprosthetic
lucency.

Question mild suprapatellar soft tissue swelling.

Soft tissue calcifications at the medial proximal LEFT lower leg.
IMPRESSION: LEFT knee prosthesis without acute complication.

## 2020-08-20 MED ORDER — HYDROMORPHONE HCL 1 MG/ML IJ SOLN
1.0000 mg | Freq: Once | INTRAMUSCULAR | Status: AC
  Administered 2020-08-20: 1 mg via INTRAVENOUS
  Filled 2020-08-20: qty 1

## 2020-08-20 MED ORDER — HYDROMORPHONE HCL 1 MG/ML IJ SOLN
2.0000 mg | Freq: Once | INTRAMUSCULAR | Status: AC
  Administered 2020-08-20: 2 mg via INTRAVENOUS
  Filled 2020-08-20: qty 2

## 2020-08-20 MED ORDER — LIDOCAINE 5 % EX PTCH
1.0000 | MEDICATED_PATCH | CUTANEOUS | Status: DC
  Administered 2020-08-20: 1 via TRANSDERMAL
  Filled 2020-08-20: qty 1

## 2020-08-20 NOTE — ED Notes (Signed)
ED Provider at bedside. 

## 2020-08-20 NOTE — ED Notes (Signed)
Called [facility] to transport pt home spoke to [facility]

## 2020-08-20 NOTE — ED Notes (Signed)
Secretary called [facility] for transport to home

## 2020-08-20 NOTE — ED Notes (Signed)
[facility] here for transport. 

## 2020-08-20 NOTE — ED Triage Notes (Signed)
Pt was with Physical therapy outside walking and left knee buckled and pt was assisted to ground by PT.  Left Knee replacement sept 27,
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
## 2020-08-20 NOTE — ED Notes (Signed)
Pt updated on wait for PTAR. Pt request more pain medication.

## 2020-08-20 NOTE — Progress Notes (Signed)
   08/20/20 1931  TOC ED Mini Assessment  TOC Time spent with patient (minutes): 30  PING Used in TOC Assessment No  Admission or Readmission Diverted Yes  Interventions which prevented an admission or readmission [place] or Services  What brought you to the Emergency Department?  s/p fall at [place] of departure [place]  Patient states their goals for this hospitalization and ongoing recovery are: " I want to recover"  CMS Medicare.gov Compare Post Acute Care list provided to: Patient  Choice offered to / list presented to  Patient  [facility] ED CM received consult from [facility] concerning [place] services. ED CM spoke with patient by phone to discuss recommendations patient is agreeable. CM explained that [facility] is [facility] [place], patient verbalized understanding and is agreeable with sending referral to [facility]  Information placed on AVS and patient made aware that if she does not receive a call within 24-48 hours she should contact [facility] TOC Department information placed on AVS, No further questions or concerns voiced.
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
## 2020-08-20 NOTE — ED Notes (Signed)
PT tearful, stating she cant go home, her leg hurts to much. Spouse at bedside. Informed MD.

## 2020-08-20 NOTE — Discharge Instructions (Signed)
You have muscle strain of your thigh.   Since you are under pain management already, please continue taking your Nucynta as prescribed. If you need more pain medicine, please see your pain management doctor.  You should also follow-up with your orthopedic doctor.  Try to use the knee immobilizer and your walker for several days.   You may need to rest for this week but you need to restart therapy next week.  Return to ER if you have worsening knee pain, unable to walk.

## 2020-08-20 NOTE — ED Notes (Signed)
PT into see pt. Pt unable to stand to transfer to w/c. Informed provider. Pt to have PTAR come for transport to home. Primary RN given report.

## 2020-08-21 ENCOUNTER — Ambulatory Visit: Payer: Medicare HMO | Admitting: Physical Therapy

## 2020-08-22 ENCOUNTER — Ambulatory Visit: Payer: Medicare HMO

## 2020-08-23 ENCOUNTER — Encounter: Payer: Medicare HMO | Admitting: Physical Therapy

## 2020-08-26 ENCOUNTER — Encounter: Payer: Medicare HMO | Admitting: Physical Therapy

## 2020-09-02 ENCOUNTER — Encounter: Payer: Medicare HMO | Admitting: Physical Therapy

## 2020-09-12 ENCOUNTER — Encounter: Payer: Medicare HMO | Admitting: Physical Therapy

## 2020-09-18 ENCOUNTER — Encounter: Payer: Medicare HMO | Admitting: Physical Therapy

## 2020-09-25 ENCOUNTER — Ambulatory Visit (INDEPENDENT_AMBULATORY_CARE_PROVIDER_SITE_OTHER): Payer: Medicare HMO

## 2020-09-25 DIAGNOSIS — J309 Allergic rhinitis, unspecified: Secondary | ICD-10-CM

## 2020-09-26 ENCOUNTER — Encounter: Payer: Medicare HMO | Admitting: Physical Therapy

## 2020-09-26 ENCOUNTER — Other Ambulatory Visit: Payer: Self-pay | Admitting: Allergy & Immunology

## 2020-09-27 ENCOUNTER — Other Ambulatory Visit: Payer: Self-pay

## 2020-09-27 ENCOUNTER — Ambulatory Visit (INDEPENDENT_AMBULATORY_CARE_PROVIDER_SITE_OTHER): Payer: Medicare HMO

## 2020-09-27 DIAGNOSIS — J4550 Severe persistent asthma, uncomplicated: Secondary | ICD-10-CM | POA: Diagnosis not present

## 2020-09-30 ENCOUNTER — Encounter: Payer: Self-pay | Admitting: Allergy & Immunology

## 2020-09-30 ENCOUNTER — Other Ambulatory Visit: Payer: Self-pay

## 2020-09-30 ENCOUNTER — Ambulatory Visit: Payer: Medicare HMO | Admitting: Allergy & Immunology

## 2020-09-30 VITALS — BP 118/62 | HR 82 | Resp 18 | Ht 64.0 in | Wt 235.0 lb

## 2020-09-30 DIAGNOSIS — J309 Allergic rhinitis, unspecified: Secondary | ICD-10-CM | POA: Diagnosis not present

## 2020-09-30 DIAGNOSIS — J4550 Severe persistent asthma, uncomplicated: Secondary | ICD-10-CM

## 2020-09-30 MED ORDER — FLUTICASONE PROPIONATE 50 MCG/ACT NA SUSP
2.0000 | Freq: Every day | NASAL | 1 refills | Status: DC
Start: 2020-09-30 — End: 2022-01-19

## 2020-09-30 MED ORDER — MONTELUKAST SODIUM 10 MG PO TABS
10.0000 mg | ORAL_TABLET | Freq: Every day | ORAL | 1 refills | Status: DC
Start: 2020-09-30 — End: 2021-05-19

## 2020-09-30 MED ORDER — ALBUTEROL SULFATE (2.5 MG/3ML) 0.083% IN NEBU: 2.5000 mg | INHALATION_SOLUTION | RESPIRATORY_TRACT | 2 refills | Status: DC | PRN

## 2020-09-30 MED ORDER — PROAIR RESPICLICK 108 (90 BASE) MCG/ACT IN AEPB
2.0000 | INHALATION_SPRAY | RESPIRATORY_TRACT | 1 refills | Status: DC | PRN
Start: 2020-09-30 — End: 2020-10-09

## 2020-09-30 MED ORDER — SPIRIVA RESPIMAT 1.25 MCG/ACT IN AERS: INHALATION_SPRAY | RESPIRATORY_TRACT | 1 refills | Status: DC

## 2020-09-30 NOTE — Patient Instructions (Addendum)
1. Severe persistent asthma, uncomplicated  - Lung function deferred today.  - We are not going to make any changes since you are doing so well.  - Daily controller medication(s): Fasenra every 8 weeks and Spiriva 1.31mcg two puffs once daily - Prior to physical activity: ProAir 2 puffs 10-15 minutes before physical activity. - Rescue medications: ProAir 4 puffs every 4-6 hours as needed or albuterol nebulizer one vial puffs every 4-6 hours as needed - Asthma control goals:  * Full participation in all desired activities (may need albuterol before activity) * Albuterol use two time or less a week on average (not counting use with activity) * Cough interfering with sleep two time or less a month * Oral steroids no more than once a year * No hospitalizations  2. Allergic rhinitis - on allergen immunotherapy (prescription altered April 2018) - Continue with allergy shots at the same schedule. - Continue with Flonase two sprays per nostril daily.  - Continue with the antihistaations, or changes in symptoms. Call us before going to the ED for breathing or allergy symptoms since we might be able to fit you in for a sick visit. Feel free to contact us anytime with any questions, problems, or concerns. mine Surveyor, mining) as needed.    3. GERD  - Continue with Dexilant 60mg  twice daily.  4. Return in about 6 months (around 03/31/2021).    Please inform us of any Emergency Department visits or hospitalizations.   It was a pleasure to see you again today!  Websites that have reliable patient information: 1. American Academy of Asthma, Allergy, and Immunology: www.aaaai.org 2. Food Allergy Research and Education (FARE): foodallergy.org 3. Mothers of Asthmatics: http://www.asthmacommunitynetwork.org 4. American College of Allergy, Asthma, and Immunology: www.acaai.org   COVID-19 Vaccine Information can be found at:  ShippingScam.co.uk For questions related to vaccine distribution or appointments, please email vaccine@[email or web address] .com or call (618)[phone].     "Like" [place] on Facebook and Instagram for our latest updates!     HAPPY FALL!     Make sure you are registered to vote! If you have moved or changed any of your contact information, you will need to get this updated before voting!  In some cases, you MAY be able to register to vote online: [email or web address]

## 2020-09-30 NOTE — Progress Notes (Signed)
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 Severe persistent asthma, uncomplicated  - Lung function deferred today.  - We are not going to make any changes since you are doing so well.  - Daily controller medication(s): Fasenra every 8 weeks and Spiriva 1.29mcg two puffs once daily - Prior to physical activity: ProAir 2 puffs 10-15 minutes before physical activity. - Rescue medications: ProAir 4 puffs every 4-6 hours as needed or albuterol nebulizer one vial puffs every 4-6 hours as needed - Asthma control goals:  * Full participation in all desired activities (may need albuterol before activity) * Albuterol use two time or less a week on average (not counting use with activity) * Cough interfering with sleep two time or less a month * Oral steroids no more than once a year * No hospitalizations  2. Allergic rhinitis - on allergen immunotherapy (prescription altered April 2018) - Continue with allergy shots at the same schedule. - Continue with Flonase two sprays per nostril daily.  - Continue with the antihistaations, or changes in symptoms. Call us before going to the ED for breathing or allergy symptoms since we might be able to fit you in for a sick visit. Feel free to contact us anytime with any questions, problems, or concerns. mine Surveyor, mining) as needed.    3. GERD  - Continue with Dexilant 60mg  twice daily.  4. Return in about 6  months (around 03/31/2021).    Subjective:   Joanna [name] is a 54 y.o. female presenting today for follow up of  Chief Complaint  Patient presents with  . Asthma    doing well for the most part. she did have some minor coughing and shortness of breath last week, but believes that is due to not having had her allergy injections or Fasnera in 2 months due to her recent knee surgeries.     Joanna [name] has a history of the following: Patient Active Problem List   Diagnosis Date Noted  . Moderate persistent asthma without complication 74/25/9563  . Seasonal and perennial allergic rhinitis 04/16/2017  . Impingement syndrome of both shoulders 11/17/2016  . History of insect sting allergy 06/03/2016  . Thrush 06/03/2016  . 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  . Primary immune deficiency disorder ([place]) 05/15/2016  . Immunodeficiency ([place]) 05/15/2016  . Allergic rhinitis due to pollen 05/15/2016  . Insect sting allergy, current reaction 05/15/2016  . Severe persistent asthma 05/12/2016  . 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 Essential hypertension 05/12/2016  . Gastroesophageal reflux disease 05/12/2016  . Status post below knee amputation of right lower extremity ([place]) 05/12/2016    History obtained from: chart review and patient.  Joanna [name] is a 54 y.o. female presenting for a follow up visit. She was last seen in April 2021. At that time, her lung function looked good. We decided not to make medications at all. We continued with her [name] as well as her Spiriva. She was also on her allergen immunotherapy as well as her nasal sprays and antihistamine.   In the  interim, she had some knee surgery in October or so. She did have [name] a couple of days ago. She had a total knee replacement on the left side. She was walking outside and one of the exercises that they had her do was go up and down a curb. Unfortunately, she fell down and tore some more  ligaments. She had to have another surgery. She is doing therapy again but at a different place. She tried to have the medical bills covered at least, but they have been refusing to do that.   Asthma/Respiratory Symptom History: Last week she had a day or two when she developed some SOB and was coughing. She did not get steroids or antibiotics. She is on the Spiriva two puffs once daily. Everything symptom wise seems to be going well. She is doing very well on the [place].   Allergic Rhinitis Symptom History: She actually stopped the Patanase since she did not like the taste. She remains on the fluticasone as well as Singulair and the antihistamine. She has restarted her allergen immunotherapy.   Food Allergy Symptom History: She continues to avoid shellfish. She has not had accidental exposures at all. Her EpiPen is up to date.   She is vaccinated with a booster and she has a flu shot.   Her husband was working 10-15 hours per week anyway at [employer], but he is going to be quitting likely. He is off for three months to drive her around.   Her sister got a bedsore and was in the Rehab facility for a long period of time. She ended up getting COVID in the Rehab facility and she has been very tired.   Otherwise, there have been no changes to her past medical history, surgical history, family history, or social history.    Review of Systems  Constitutional: Negative.  Negative for fever, malaise/fatigue and weight loss.  HENT: Negative.  Negative for congestion, ear discharge and ear pain.   Eyes: Negative for pain, discharge and redness.  Respiratory: Negative for cough, sputum production, shortness of breath and wheezing.   Cardiovascular: Negative.  Negative for chest pain and palpitations.  Gastrointestinal: Negative for abdominal pain, heartburn, nausea and vomiting.  Musculoskeletal: Positive for myalgias.  Skin: Negative.  Negative for itching and rash.  Neurological: Negative for  dizziness and headaches.  Endo/Heme/Allergies: Negative for environmental allergies. Does not bruise/bleed easily.       Objective:   Blood pressure 118/62, pulse 82, resp. rate 18, height 5\' 4"  (1.626 m), weight 235 lb (106.6 kg), SpO2 97 %. Body mass index is 40.34 kg/m.   Physical Exam:  Physical Exam Constitutional:      Appearance: She is well-developed.     Comments: Moving air well in all lung fields. No increased work of breathing noted.   HENT:     Head: Normocephalic and atraumatic.     Right Ear: Tympanic membrane, ear canal and external ear normal.     Left Ear: Tympanic membrane, ear canal and external ear normal.     Nose: No nasal deformity, septal deviation, mucosal edema or rhinorrhea.     Right Turbinates: Enlarged and swollen.     Left Turbinates: Enlarged and swollen.     Right Sinus: No maxillary sinus tenderness or frontal sinus tenderness.     Left Sinus: No maxillary sinus tenderness or frontal sinus tenderness.     Mouth/Throat:     Mouth: Mucous membranes are not pale and not dry.  Pharynx: Uvula midline.  Eyes:     General:        Right eye: No discharge.        Left eye: No discharge.     Conjunctiva/sclera: Conjunctivae normal.     Right eye: Right conjunctiva is not injected. No chemosis.    Left eye: Left conjunctiva is not injected. No chemosis.    Pupils: Pupils are equal, round, and reactive to light.  Cardiovascular:     Rate and Rhythm: Normal rate and regular rhythm.     Heart sounds: Normal heart sounds.  Pulmonary:     Effort: Pulmonary effort is normal. No tachypnea, accessory muscle usage or respiratory distress.     Breath sounds: Normal breath sounds. No wheezing, rhonchi or rales.     Comments: Moving air well in all lung fields. No increased work of breathing noted.  Chest:     Chest wall: No tenderness.  Lymphadenopathy:     Cervical: No cervical adenopathy.  Skin:    Coloration: Skin is not pale.     Findings: No  abrasion, erythema, petechiae or rash. Rash is not papular, urticarial or vesicular.     Comments: No eczematous or urticarial lesions noted.   Neurological:     Mental Status: She is alert.  Psychiatric:        Behavior: Behavior is cooperative.      Diagnostic studies: none     Salvatore Marvel, MD  [facility]

## 2020-10-08 ENCOUNTER — Telehealth: Payer: Self-pay

## 2020-10-08 NOTE — Telephone Encounter (Signed)
Pharmacy notified that Proair Respi-click is not on her preferred drug list. They needed to substitute with Albuterol Proair HFA.  Pt has been notified.

## 2020-10-08 NOTE — Telephone Encounter (Signed)
Noted - sounds good!  ? ?Laritza Vokes, MD ?[facility] ? ?

## 2020-10-09 ENCOUNTER — Other Ambulatory Visit: Payer: Self-pay

## 2020-10-09 MED ORDER — PROAIR RESPICLICK 108 (90 BASE) MCG/ACT IN AEPB: 2.0000 | INHALATION_SPRAY | RESPIRATORY_TRACT | 1 refills | Status: DC | PRN

## 2020-10-10 ENCOUNTER — Ambulatory Visit (INDEPENDENT_AMBULATORY_CARE_PROVIDER_SITE_OTHER): Payer: Medicare HMO

## 2020-10-10 DIAGNOSIS — J309 Allergic rhinitis, unspecified: Secondary | ICD-10-CM

## 2020-10-16 ENCOUNTER — Ambulatory Visit (INDEPENDENT_AMBULATORY_CARE_PROVIDER_SITE_OTHER): Payer: Medicare HMO

## 2020-10-16 DIAGNOSIS — J309 Allergic rhinitis, unspecified: Secondary | ICD-10-CM | POA: Diagnosis not present

## 2020-10-17 ENCOUNTER — Telehealth: Payer: Self-pay

## 2020-10-17 NOTE — Telephone Encounter (Signed)
Pt.'s plan doesn't cover the proair respiclick hfa. Ok to change to generic 90 mcg hfa albuterol 3 inhalers with one refill. Spoke to the pharmacist Keondra Haydu [facility] and gave a verbal order for the  medication change.

## 2020-10-22 ENCOUNTER — Ambulatory Visit (INDEPENDENT_AMBULATORY_CARE_PROVIDER_SITE_OTHER): Payer: Medicare HMO

## 2020-10-22 DIAGNOSIS — J309 Allergic rhinitis, unspecified: Secondary | ICD-10-CM

## 2020-10-30 ENCOUNTER — Ambulatory Visit (INDEPENDENT_AMBULATORY_CARE_PROVIDER_SITE_OTHER): Payer: Medicare HMO | Admitting: *Deleted

## 2020-10-30 DIAGNOSIS — J309 Allergic rhinitis, unspecified: Secondary | ICD-10-CM | POA: Diagnosis not present

## 2020-11-22 ENCOUNTER — Other Ambulatory Visit: Payer: Self-pay

## 2020-11-22 ENCOUNTER — Ambulatory Visit (INDEPENDENT_AMBULATORY_CARE_PROVIDER_SITE_OTHER): Payer: Medicare HMO | Admitting: *Deleted

## 2020-11-22 DIAGNOSIS — J4550 Severe persistent asthma, uncomplicated: Secondary | ICD-10-CM

## 2020-11-24 ENCOUNTER — Other Ambulatory Visit: Payer: Self-pay | Admitting: Allergy & Immunology

## 2020-11-25 ENCOUNTER — Other Ambulatory Visit: Payer: Self-pay

## 2020-11-25 MED ORDER — DEXLANSOPRAZOLE 60 MG PO CPDR
1.0000 | DELAYED_RELEASE_CAPSULE | Freq: Two times a day (BID) | ORAL | 0 refills | Status: DC
Start: 2020-11-25 — End: 2021-01-20

## 2020-11-25 MED ORDER — DEXLANSOPRAZOLE 60 MG PO CPDR
1.0000 | DELAYED_RELEASE_CAPSULE | Freq: Two times a day (BID) | ORAL | 1 refills | Status: DC
Start: 2020-11-25 — End: 2020-11-25

## 2020-11-25 NOTE — Telephone Encounter (Signed)
Refill for Dexilant 60 mg x 90 day supply with 1 refill at [facility].

## 2020-11-25 NOTE — Telephone Encounter (Signed)
Refill for  Dexilant 60 mg x 28 days sent to local pharmacy till the rx is sent to home by [facility]- per patient

## 2020-11-28 ENCOUNTER — Ambulatory Visit (INDEPENDENT_AMBULATORY_CARE_PROVIDER_SITE_OTHER): Payer: Medicare HMO

## 2020-11-28 DIAGNOSIS — J309 Allergic rhinitis, unspecified: Secondary | ICD-10-CM | POA: Diagnosis not present

## 2020-12-02 DIAGNOSIS — J3089 Other allergic rhinitis: Secondary | ICD-10-CM

## 2020-12-02 NOTE — Progress Notes (Signed)
VIAL EXP 12/02/21 

## 2020-12-03 DIAGNOSIS — J3081 Allergic rhinitis due to animal (cat) (dog) hair and dander: Secondary | ICD-10-CM

## 2020-12-12 ENCOUNTER — Ambulatory Visit: Payer: Medicare HMO | Admitting: Allergy & Immunology

## 2020-12-12 ENCOUNTER — Other Ambulatory Visit: Payer: Self-pay

## 2020-12-12 ENCOUNTER — Encounter: Payer: Self-pay | Admitting: Allergy & Immunology

## 2020-12-12 VITALS — BP 128/82 | HR 71 | Temp 98.5°F | Resp 18

## 2020-12-12 DIAGNOSIS — J3089 Other allergic rhinitis: Secondary | ICD-10-CM | POA: Diagnosis not present

## 2020-12-12 DIAGNOSIS — T7800XD Anaphylactic reaction due to unspecified food, subsequent encounter: Secondary | ICD-10-CM

## 2020-12-12 DIAGNOSIS — J0100 Acute maxillary sinusitis, unspecified: Secondary | ICD-10-CM

## 2020-12-12 DIAGNOSIS — J4550 Severe persistent asthma, uncomplicated: Secondary | ICD-10-CM | POA: Diagnosis not present

## 2020-12-12 DIAGNOSIS — K219 Gastro-esophageal reflux disease without esophagitis: Secondary | ICD-10-CM | POA: Diagnosis not present

## 2020-12-12 DIAGNOSIS — J302 Other seasonal allergic rhinitis: Secondary | ICD-10-CM

## 2020-12-12 MED ORDER — CLARITHROMYCIN 500 MG PO TABS: 500.0000 mg | ORAL_TABLET | Freq: Two times a day (BID) | ORAL | 0 refills | Status: DC

## 2020-12-12 MED ORDER — FLUCONAZOLE 100 MG PO TABS: 100.0000 mg | ORAL_TABLET | ORAL | 1 refills | Status: DC

## 2020-12-12 MED ORDER — CLARITHROMYCIN 500 MG PO TABS: 500.0000 mg | ORAL_TABLET | Freq: Two times a day (BID) | ORAL | 0 refills | Status: AC

## 2020-12-12 MED ORDER — FLUCONAZOLE 100 MG PO TABS: 100.0000 mg | ORAL_TABLET | ORAL | 1 refills | Status: AC
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
## 2020-12-12 NOTE — Patient Instructions (Addendum)
1. Severe persistent asthma, uncomplicated  - Lung function looked stable today. - We are not going to make any changes since you are doing so well.  - Daily controller medication(s): Fasenra every 8 weeks and Spiriva 1.46mcg two puffs once daily - Prior to physical activity: ProAir 2 puffs 10-15 minutes before physical activity. - Rescue medications: ProAir 4 puffs every 4-6 hours as needed or albuterol nebulizer one vial puffs every 4-6 hours as needed - Asthma control goals:  * Full participation in all desired activities (may need albuterol before activity) * Albuterol use two time or less a week on average (not counting use with activity) * Cough interfering with sleep two time or less a month * Oral steroids no more than once a year * No hospitalizations  2. Allergic rhinitis - on allergen immunotherapy (prescription altered April 2018) - Continue with allergy shots at the same schedule. - Continue with Flonase two sprays per nostril daily.  - Continue with the antihistaations as needed.    3. Acute sinusitis - With your current symptoms and time course, antibiotics are needed: clarithromycin 500mg  twice daily for 10 days - I also sent in Diflucan if needed for a yeast infection.  - Add on nasal saline spray (i.e., Simply Saline) or nasal saline lavage (i.e., NeilMed) as needed prior to medicated nasal sprays. - For thick post nasal drainage, add guaifenesin [phone] mg (Mucinex) twice daily as needed for mucous thinning with adequate hydration to help it work.    4. GERD  - Continue with Dexilant 60mg  twice daily.  5. Return in about 6 months (around 06/11/2021).    Please inform us of any Emergency Department visits, hospitalizations, or changes in symptoms. Call us before going to the ED for breathing or allergy symptoms since we might be able to fit you in for a sick visit. Feel free to contact us anytime with any questions, problems, or concerns.  It was a pleasure to see  you again today!  Websites that have reliable patient information: 1. American Academy of Asthma, Allergy, and Immunology: www.aaaai.org 2. Food Allergy Research and Education (FARE): foodallergy.org 3. Mothers of Asthmatics: http://www.asthmacommunitynetwork.org 4. American College of Allergy, Asthma, and Immunology: www.acaai.org   COVID-19 Vaccine Information can be found at: ShippingScam.co.uk For questions related to vaccine distribution or appointments, please email [email or web address] or call [phone].   We realize that you might be concerned about having an allergic reaction to the COVID19 vaccines. To help with that concern, WE ARE OFFERING THE COVID19 VACCINES IN OUR OFFICE! Ask the front desk for dates!     "Like" [place] on Facebook and Instagram for our latest updates!      A healthy democracy works best when [employer] participate! Make sure you are registered to vote! If you have moved or changed any of your contact information, you will need to get this updated before voting!  In some cases, you MAY be able to register to vote online: [email or web address]

## 2020-12-23 ENCOUNTER — Ambulatory Visit (INDEPENDENT_AMBULATORY_CARE_PROVIDER_SITE_OTHER): Payer: Medicare HMO

## 2020-12-23 DIAGNOSIS — J309 Allergic rhinitis, unspecified: Secondary | ICD-10-CM

## 2021-01-17 ENCOUNTER — Ambulatory Visit (INDEPENDENT_AMBULATORY_CARE_PROVIDER_SITE_OTHER): Payer: Medicare HMO

## 2021-01-17 ENCOUNTER — Other Ambulatory Visit: Payer: Self-pay

## 2021-01-17 DIAGNOSIS — J4550 Severe persistent asthma, uncomplicated: Secondary | ICD-10-CM

## 2021-01-20 ENCOUNTER — Ambulatory Visit (INDEPENDENT_AMBULATORY_CARE_PROVIDER_SITE_OTHER): Payer: Medicare HMO

## 2021-01-20 ENCOUNTER — Other Ambulatory Visit: Payer: Self-pay

## 2021-01-20 DIAGNOSIS — J309 Allergic rhinitis, unspecified: Secondary | ICD-10-CM | POA: Diagnosis not present

## 2021-01-20 MED ORDER — DEXLANSOPRAZOLE 60 MG PO CPDR
1.0000 | DELAYED_RELEASE_CAPSULE | Freq: Two times a day (BID) | ORAL | 4 refills | Status: DC
Start: 2021-01-20 — End: 2021-02-14

## 2021-01-23 ENCOUNTER — Telehealth: Payer: Self-pay | Admitting: Allergy & Immunology

## 2021-01-23 NOTE — Telephone Encounter (Signed)
Pharmacy states ins won't cover [place]) 60 MG... meds that would be covered are  omerprazole dr capsules  pantoprazole sodium dr tablets  Lansoprazole dr capsules  Esomeprazole magnesium dr

## 2021-01-23 NOTE — Telephone Encounter (Signed)
She needs to be on Dexilant.  She has been stable on that for several years now.  Her reflux can flare her asthma.  Salvatore Marvel, MD [facility] [place]

## 2021-01-24 NOTE — Telephone Encounter (Signed)
Okay I will work on the [place].

## 2021-01-27 NOTE — Telephone Encounter (Signed)
Pt called wanting me to look into doing a pa for her dexilant I informed her that a pa was completed and I was no sure if we had results back yet. I looked on cover my meds and sow this message. I will reach out and see what exactly this message ment from the insurance Your PA request cannot be processed for the member plan submitted. For further inquiries please contact the number on the back of the member prescription car

## 2021-01-29 ENCOUNTER — Telehealth: Payer: Self-pay | Admitting: *Deleted

## 2021-01-29 NOTE — Telephone Encounter (Signed)
Patient Joanna [name] [facility] plan has denied her Joanna [name] advising step therapy and has to try and fail Dupixent

## 2021-01-29 NOTE — Telephone Encounter (Signed)
I did reach out to patient and advised denial for Fasenra with Dupixent step therapy requirement and rx to [facility]. Advised patient storage and dosing directions and to call to bring same in to start around the beginning of May

## 2021-01-30 NOTE — Telephone Encounter (Signed)
There is not option for peer to peer only appeal and she will need to appoint rep (provider) to do so.  They advise this is their policy now.  If you want to write something up I will gladly send to them.

## 2021-01-30 NOTE — Telephone Encounter (Signed)
Is there a peer-to-peer option?  She has been stable on Fasenra for probably 3 years.  She has had no steroids or exacerbations to speak of in that time.  This is absolutely absurd.  Is there at least somewhere I can send a strongly worded letter?  I certainly have choice words.  Salvatore Marvel, MD [facility] [place]

## 2021-01-31 NOTE — Telephone Encounter (Signed)
pts insurance [employer] denied the dexilant pa even though I had put on the pa that she had tried and failed several other meds please advise how to move forward. Either appeal or sending in another medication

## 2021-01-31 NOTE — Telephone Encounter (Signed)
Ok I will write something this weekend. I am pissed. Insurance companies suck. And yes I am leaving this unprofessional note in the chart.  Salvatore Marvel, MD [facility] [place]

## 2021-02-03 NOTE — Telephone Encounter (Signed)
L/M for patient that her Dupixent will be delivered to clinic next week but hold off until she is due for next Fasenra to start.  I did advise her that Dr Ernst Bowler is upset over forced change and writing letter to Ins but not sure if will impact their policy

## 2021-02-05 NOTE — Telephone Encounter (Signed)
PA has been approved for Dexilant 60mg . PA has been faxed to pharmacy, labeled, and placed in bulk scanning.

## 2021-02-05 NOTE — Telephone Encounter (Signed)
Letter written.  Please forward to letter to wherever it should go.   Salvatore Marvel, MD [facility] [place]

## 2021-02-13 ENCOUNTER — Telehealth: Payer: Self-pay | Admitting: *Deleted

## 2021-02-13 NOTE — Telephone Encounter (Signed)
L/M for patient to contact me regarding change in bios.  Appeal for [name] approved so we will give sample in place of next dose to offset her copay she made for [place] and place Dupixent in our inactive and she can continue [place]

## 2021-02-14 ENCOUNTER — Telehealth: Payer: Self-pay | Admitting: *Deleted

## 2021-02-14 MED ORDER — DEXLANSOPRAZOLE 60 MG PO CPDR: 1.0000 | DELAYED_RELEASE_CAPSULE | Freq: Two times a day (BID) | ORAL | 1 refills | Status: DC

## 2021-02-14 NOTE — Telephone Encounter (Signed)
Pt called and states her dexilant was only sent for 14 days and needs to be changed to a 90 day supply. [facility] and [facility] on [facility] phone and had them cancel the first order and authorize the 90 days instead.

## 2021-02-14 NOTE — Telephone Encounter (Signed)
Spoke to patient and advised approval and change back to [place] going forward

## 2021-02-18 ENCOUNTER — Ambulatory Visit (INDEPENDENT_AMBULATORY_CARE_PROVIDER_SITE_OTHER): Payer: Medicare HMO

## 2021-02-18 ENCOUNTER — Other Ambulatory Visit: Payer: Self-pay

## 2021-02-18 DIAGNOSIS — J309 Allergic rhinitis, unspecified: Secondary | ICD-10-CM | POA: Diagnosis not present

## 2021-03-03 ENCOUNTER — Telehealth: Payer: Self-pay | Admitting: Orthopedic Surgery

## 2021-03-03 NOTE — Telephone Encounter (Signed)
Pt would like to know if you could fax over a new prescription for a left shoe insert to the [facility] in [place]. Fax # [phone]

## 2021-03-03 NOTE — Telephone Encounter (Signed)
Order written and faxed per pt request.

## 2021-03-13 ENCOUNTER — Encounter: Payer: Self-pay | Admitting: Allergy & Immunology

## 2021-03-13 ENCOUNTER — Ambulatory Visit: Payer: Medicare HMO | Admitting: Allergy & Immunology

## 2021-03-13 ENCOUNTER — Ambulatory Visit (INDEPENDENT_AMBULATORY_CARE_PROVIDER_SITE_OTHER): Payer: Medicare HMO | Admitting: *Deleted

## 2021-03-13 ENCOUNTER — Other Ambulatory Visit: Payer: Self-pay

## 2021-03-13 VITALS — BP 116/78 | HR 72 | Temp 98.1°F | Resp 17 | Wt 254.1 lb

## 2021-03-13 DIAGNOSIS — J4550 Severe persistent asthma, uncomplicated: Secondary | ICD-10-CM

## 2021-03-13 DIAGNOSIS — K219 Gastro-esophageal reflux disease without esophagitis: Secondary | ICD-10-CM

## 2021-03-13 DIAGNOSIS — J302 Other seasonal allergic rhinitis: Secondary | ICD-10-CM

## 2021-03-13 DIAGNOSIS — J3089 Other allergic rhinitis: Secondary | ICD-10-CM | POA: Diagnosis not present

## 2021-03-13 DIAGNOSIS — T7800XD Anaphylactic reaction due to unspecified food, subsequent encounter: Secondary | ICD-10-CM

## 2021-03-13 NOTE — Progress Notes (Signed)
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  They have been going on for a couple of months.  Her symptoms are responsive to albuterol, but it has affected her sleep.  Its unclear what the trigger is.  She did try to change her on her reflux medications which did help with the reflux but did not have a subsequent effect on her coughing and wheezing.  She feels like this may be related to increased allergen exposure and is wanting to talk about retesting.  However, she is receiving just about every antigen and her immunotherapy at this point in time anyway.  Before we go around changing her immunotherapy prescription and retesting her, I think it would be prudent to try to change her asthma medications.  She has a history of intolerance to inhaled steroids.  Therefore, we will stop her Spiriva and changed her to [facility], which is a nebulized LAMA.  I think we should do this at night to see if this affects her coughing and wheezing at night.  We can make other changes pending her clinical response to this.  We could also add Perforomist to the mix as well.   Plan/Recommendations:   1. Severe persistent asthma, uncomplicated  - Lung function looked fairly stable today. - We are going to stop the Spiriva and start Yupelri nebulized once daily (do this AT NIGHT so the medication is fully in your system before going to bed)  - Once we get your symptoms under control, we can start talking about  taking medications, such as Singulair) away.  - Daily controller medication(s): Fasenra every 8 weeks and Yupelri once nightly via nebulizer - Prior to physical activity: ProAir 2 puffs 10-15 minutes before physical activity. - Rescue medications: ProAir 4 puffs every 4-6 hours as needed or albuterol nebulizer one vial puffs every 4-6 hours as needed - Asthma control goals:  * Full participation in all desired activities (may need albuterol before activity) * Albuterol use two time or less a week on average (not counting use with activity) * Cough interfering with sleep two time or less a month * Oral steroids no more than once a year * No hospitalizations  2. Allergic rhinitis - on allergen immunotherapy (prescription altered April 2018) - Continue with allergy shots at the same schedule. - Continue with Flonase two sprays per nostril daily.  - Continue with the antihistaations as needed.   - We can think about re-testing if there is no improvement with the Yupelri.   3. GERD   - Continue with Dexilant 60mg  twice daily.  4. Return in about 4 weeks (around 04/10/2021).   Subjective:   Joanna [name] is a 55 y.o. female presenting today for follow up of  Chief Complaint  Patient presents with  . Follow-up  . Cough    Frequently at night. That trigger GERD.  Marland Kitchen Wheezing    Frequently at night    Joanna [name] has a history of the following: Patient Active Problem List   Diagnosis Date Noted  . Moderate persistent asthma without complication 47/82/9562  .  Seasonal and perennial allergic rhinitis 04/16/2017  . Impingement syndrome of both shoulders 11/17/2016  . History of insect sting allergy 06/03/2016  . Thrush 06/03/2016  . 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  . Primary immune deficiency disorder ([place]) 05/15/2016  . Immunodeficiency ([place]) 05/15/2016  . Allergic rhinitis due to pollen 05/15/2016  . Insect sting allergy, current reaction 05/15/2016  .  Severe persistent asthma 05/12/2016  . 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 Essential hypertension 05/12/2016  . Gastroesophageal reflux disease 05/12/2016  . Status post below knee amputation of right lower extremity ([place]) 05/12/2016    History obtained from: chart review and patient.  Joanna [name] is a 55 y.o. female presenting for a sick visit.  She was last seen in February 2022.  At that time, her lung function looked stable.  We did not make any medication changes.  We continued her [name] every 8 weeks and Spiriva 2 puffs once daily.  For her allergic rhinitis, we continue with her allergy shots as well as Flonase.  She was already using her antihistamines  on an as-needed basis.  We gave her clarithromycin for sinusitis.  We also sent in [place].  We will continue Dexilant 60 mg twice daily for her reflux.  Since last visit, she has mostly done well.   Asthma/Respiratory Symptom History: She is mainly having issues at night.  She does use her Spiriva every morning. She has been using the rescue inhaler nightly for around one month or so. She even though that the Dexilant was not working and she switched to Nexium at night. This did help her GERD but the coughing is making things worse.   She is unsure of any new triggers. She thinks maybe the pollens were particularly bad.  She did take the Patanase last time we talked. She is still on the Flonase. She is wondering about the Singulair, but agrees to keep it on board until we get this current coughing and wheezing under control.  Allergic Rhinitis Symptom History: She remains on her allergen immunotherapy.  She stopped the Patanase as of the last visit.  She remains on her daily antihistamine.  She does not feel that her coughing and wheezing worsened when she spaced out to every month injections.  She does not think this has anything to do with it at all.  Her husband retired from work completely.  He apparently needs a knee  replacement but has been putting this off.  Her sisters continue to be an issue.  One of the sisters whom she does not talk to has a husband who was recently brutally murdered in [place].  She is going to call her just to see how she is doing.  Otherwise, there have been no changes to her past medical history, surgical history, family history, or social history.    ROS     Objective:   Blood pressure 116/78, pulse 72, temperature 98.1 F (36.7 C), temperature source Temporal, resp. rate 17, weight 254 lb 1.6 oz (115.3 kg), SpO2 97 %. Body mass index is 43.62 kg/m.   Physical Exam:  Physical Exam Constitutional:      Appearance: She is well-developed.     Comments: Pleasant female. Cooperative with the exam.   HENT:     Head: Atraumatic.     Right Ear: Tympanic membrane, ear canal and external ear normal.     Left Ear: Tympanic membrane, ear canal and external ear normal.  Nose: Rhinorrhea present. No nasal deformity, septal deviation or mucosal edema.     Right Turbinates: Enlarged and swollen.     Left Turbinates: Enlarged and swollen.     Right Sinus: No maxillary sinus tenderness or frontal sinus tenderness.     Left Sinus: No maxillary sinus tenderness or frontal sinus tenderness.     Mouth/Throat:     Mouth: Mucous membranes are not pale and not dry.     Pharynx: Uvula midline.  Eyes:     General:        Right eye: No discharge.        Left eye: No discharge.     Conjunctiva/sclera: Conjunctivae normal.     Right eye: Right conjunctiva is not injected. No chemosis.    Left eye: Left conjunctiva is not injected. No chemosis.    Pupils: Pupils are equal, round, and reactive to light.  Cardiovascular:     Rate and Rhythm: Normal rate and regular rhythm.     Heart sounds: Normal heart sounds.  Pulmonary:     Effort: Pulmonary effort is normal. No tachypnea, accessory muscle usage or respiratory distress.     Breath sounds: Normal breath sounds. No wheezing, rhonchi  or rales.     Comments: Moving air well in all lung fields.  There are some coarse upper airway sounds.  However, compared to some of her previous exams, she actually sounds fairly good. Chest:     Chest wall: No tenderness.  Lymphadenopathy:     Cervical: No cervical adenopathy.  Skin:    Coloration: Skin is not pale.     Findings: No abrasion, erythema, petechiae or rash. Rash is not papular, urticarial or vesicular.  Neurological:     Mental Status: She is alert.  Psychiatric:        Behavior: Behavior is cooperative.      Diagnostic studies:    Spirometry: results abnormal (FEV1: 1.76/68%, FVC: 2.18/67%, FEV1/FVC: 81%).    Spirometry consistent with possible restrictive disease. Overall her values are fairly stable compared to previous spirometric findings.     Allergy Studies: none      Salvatore Marvel, MD  [facility]

## 2021-03-13 NOTE — Patient Instructions (Addendum)
1. Severe persistent asthma, uncomplicated  - Lung function looked fairly stable today. - We are going to stop the Spiriva and start Yupelri nebulized once daily (do this AT NIGHT so the medication is fully in your system before going to bed)  - Once we get your symptoms under control, we can start talking about taking medications, such as Singulair) away.  - Daily controller medication(s): Fasenra every 8 weeks and Yupelri once nightly via nebulizer - Prior to physical activity: ProAir 2 puffs 10-15 minutes before physical activity. - Rescue medications: ProAir 4 puffs every 4-6 hours as needed or albuterol nebulizer one vial puffs every 4-6 hours as needed - Asthma control goals:  * Full participation in all desired activities (may need albuterol before activity) * Albuterol use two time or less a week on average (not counting use with activity) * Cough interfering with sleep two time or less a month * Oral steroids no more than once a year * No hospitalizations  2. Allergic rhinitis - on allergen immunotherapy (prescription altered April 2018) - Continue with allergy shots at the same schedule. - Continue with Flonase two sprays per nostril daily.  - Continue with the antihistaations as needed.   - We can think about re-testing if there is no improvement with the Yupelri.   3. GERD   - Continue with Dexilant 60mg  twice daily.  4. Return in about 4 weeks (around 04/10/2021).    Please inform us of any Emergency Department visits, hospitalizations, or changes in symptoms. Call us before going to the ED for breathing or allergy symptoms since we might be able to fit you in for a sick visit. Feel free to contact us anytime with any questions, problems, or concerns.  It was a pleasure to see you again today!  Websites that have reliable patient information: 1. American Academy of Asthma, Allergy, and Immunology: www.aaaai.org 2. Food Allergy Research and Education (FARE):  foodallergy.org 3. Mothers of Asthmatics: http://www.asthmacommunitynetwork.org 4. American College of Allergy, Asthma, and Immunology: www.acaai.org   COVID-19 Vaccine Information can be found at: ShippingScam.co.uk For questions related to vaccine distribution or appointments, please email [email or web address] or call [phone].   We realize that you might be concerned about having an allergic reaction to the COVID19 vaccines. To help with that concern, WE ARE OFFERING THE COVID19 VACCINES IN OUR OFFICE! Ask the front desk for dates!     "Like" [place] on Facebook and Instagram for our latest updates!      A healthy democracy works best when [employer] participate! Make sure you are registered to vote! If you have moved or changed any of your contact information, you will need to get this updated before voting!  In some cases, you MAY be able to register to vote online: [email or web address]

## 2021-03-14 ENCOUNTER — Ambulatory Visit: Payer: Self-pay

## 2021-03-17 ENCOUNTER — Ambulatory Visit (INDEPENDENT_AMBULATORY_CARE_PROVIDER_SITE_OTHER): Payer: Medicare HMO

## 2021-03-17 DIAGNOSIS — J309 Allergic rhinitis, unspecified: Secondary | ICD-10-CM | POA: Diagnosis not present

## 2021-03-26 ENCOUNTER — Ambulatory Visit (INDEPENDENT_AMBULATORY_CARE_PROVIDER_SITE_OTHER): Payer: Medicare HMO

## 2021-03-26 DIAGNOSIS — J309 Allergic rhinitis, unspecified: Secondary | ICD-10-CM

## 2021-04-03 ENCOUNTER — Ambulatory Visit (INDEPENDENT_AMBULATORY_CARE_PROVIDER_SITE_OTHER): Payer: Medicare HMO

## 2021-04-03 DIAGNOSIS — J309 Allergic rhinitis, unspecified: Secondary | ICD-10-CM

## 2021-04-07 ENCOUNTER — Ambulatory Visit (INDEPENDENT_AMBULATORY_CARE_PROVIDER_SITE_OTHER): Payer: Medicare HMO

## 2021-04-07 DIAGNOSIS — J309 Allergic rhinitis, unspecified: Secondary | ICD-10-CM | POA: Diagnosis not present

## 2021-04-10 ENCOUNTER — Telehealth: Payer: Self-pay

## 2021-04-10 MED ORDER — DOXYCYCLINE MONOHYDRATE 100 MG PO TABS: 100.0000 mg | ORAL_TABLET | Freq: Two times a day (BID) | ORAL | 0 refills | Status: DC

## 2021-04-10 NOTE — Telephone Encounter (Signed)
For the last 3 days pt has had chest tightness, and coughing and spitting up yellow mucus. No fevers no body aches no chills doing all inhalers and allergy meds. Pt would like an [facility] [place]

## 2021-04-10 NOTE — Addendum Note (Signed)
Addended by: Felipa Emory on: 04/10/2021 04:25 PM   Modules accepted: Orders

## 2021-04-10 NOTE — Telephone Encounter (Signed)
Please send in doxycycline 100 mg twice a day for 10 days.  Salvatore Marvel, MD [facility]

## 2021-04-10 NOTE — Telephone Encounter (Signed)
[facility] and informed pt of doing so

## 2021-04-16 ENCOUNTER — Ambulatory Visit (INDEPENDENT_AMBULATORY_CARE_PROVIDER_SITE_OTHER): Payer: Medicare HMO

## 2021-04-16 DIAGNOSIS — J309 Allergic rhinitis, unspecified: Secondary | ICD-10-CM

## 2021-05-01 ENCOUNTER — Other Ambulatory Visit: Payer: Self-pay

## 2021-05-01 ENCOUNTER — Ambulatory Visit: Payer: Medicare HMO | Admitting: Allergy & Immunology

## 2021-05-01 ENCOUNTER — Encounter: Payer: Self-pay | Admitting: Allergy & Immunology

## 2021-05-01 VITALS — BP 120/70 | HR 67 | Temp 98.1°F | Resp 18 | Ht 63.25 in | Wt 248.4 lb

## 2021-05-01 DIAGNOSIS — J302 Other seasonal allergic rhinitis: Secondary | ICD-10-CM

## 2021-05-01 DIAGNOSIS — K219 Gastro-esophageal reflux disease without esophagitis: Secondary | ICD-10-CM

## 2021-05-01 DIAGNOSIS — J4550 Severe persistent asthma, uncomplicated: Secondary | ICD-10-CM | POA: Diagnosis not present

## 2021-05-01 DIAGNOSIS — J3089 Other allergic rhinitis: Secondary | ICD-10-CM | POA: Diagnosis not present

## 2021-05-01 DIAGNOSIS — T7800XD Anaphylactic reaction due to unspecified food, subsequent encounter: Secondary | ICD-10-CM

## 2021-05-01 DIAGNOSIS — J0100 Acute maxillary sinusitis, unspecified: Secondary | ICD-10-CM | POA: Diagnosis not present

## 2021-05-01 MED ORDER — CEFDINIR 300 MG PO CAPS: 300.0000 mg | ORAL_CAPSULE | Freq: Two times a day (BID) | ORAL | 0 refills | Status: AC

## 2021-05-01 NOTE — Progress Notes (Signed)
FOLLOW UP  Date of Service/Encounter:  05/01/21   Assessment:   Severe persistent asthma with eosinophilic phenotype (stable on Fasenra)   Seasonal and perennial allergic rhinitis (molds, dust mite, grasses, weeds, trees, cat, dog) - on allergen immunotherapy with prescription altered April 2018  Acute sinusitis with continued symptoms despite 10 days of doxycycline twice daily (consider sinus CT at the next visit if continued symptoms)   Gastroesophageal reflux disease   Anaphylactic shock due to food (shellfish)    Recent prolonged hospitalization due to LLE fall and left knee replacement  Plan/Recommendations:   1. Severe persistent asthma, uncomplicated  - Lung function looked fairly stable today. - We are going to add on Stiolto (combined Spiriva and a long acting albuterol) - Once we get your symptoms under control, we can start talking about taking medications, such as Singulair) away.  - Daily controller medication(s): Fasenra every 8 weeks and Stiolto two puffs once daily (samples provided) - Prior to physical activity: ProAir 2 puffs 10-15 minutes before physical activity. - Rescue medications: ProAir 4 puffs every 4-6 hours as needed or albuterol nebulizer one vial puffs every 4-6 hours as needed - Asthma control goals:  * Full participation in all desired activities (may need albuterol before activity) * Albuterol use two time or less a week on average (not counting use with activity) * Cough interfering with sleep two time or less a month * Oral steroids no more than once a year * No hospitalizations  2. Allergic rhinitis - on allergen immunotherapy (prescription altered April 2018) - Continue with allergy shots at the same schedule. - Continue with Flonase two sprays per nostril daily.  - Continue with the antihistaations as needed.   - We can think about re-testing if there is no improvement with the Stiolto.   3. Acute sinusitis  - Start cefdinir 300mg   twice daily. - We are going to do this for two weeks. - This is a 3rd generation cephalosporin and rarely if ever cross reacts with penicillin.   4. GERD   - Continue with Dexilant 60mg  twice daily.  5. Return in about 2 months (around 07/02/2021).    Subjective:   Aaylah Pokorny is a 55 y.o. female presenting today for follow up of  Chief Complaint  Patient presents with   Cough    Frequent coughing during all times of the day mostly at night. Still coughing up greenish phlegm.   Wheezing   Nasal Congestion    Kathleena Freeman has a history of the following: Patient Active Problem List   Diagnosis Date Noted   Moderate persistent asthma without complication 47/06/6282   Seasonal and perennial allergic rhinitis 04/16/2017   Impingement syndrome of both shoulders 11/17/2016   History of insect sting allergy 06/03/2016   Thrush 06/03/2016   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   Primary immune deficiency disorder ([place]) 05/15/2016   Immunodeficiency ([place]) 05/15/2016   Allergic rhinitis due to pollen 05/15/2016   Insect sting allergy, current reaction 05/15/2016   Severe persistent asthma 05/12/2016   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   Essential hypertension 05/12/2016   Gastroesophageal reflux disease 05/12/2016   Status post below knee amputation of right lower extremity ([place]) 05/12/2016    History obtained from: chart review and patient.  Ariatna is a 55 y.o. female presenting for a follow up visit.  She was last seen in May 2022.  At that time, she was having more wheezing and  coughing at night.  Her lung function looks stable.  We stopped the Spiriva and started Yupelri once daily at night to see if that helped.  We continue with Fasenra every 8 weeks.  We also discussed repeating allergy testing to see if there is something that might be contributing to her symptoms.  Since last visit, she has continued to have the green discharge. She did  have doxycycline 100mg  BID for ten days.  This has not cleared it up.  She continues to have coughing requiring her to sleep in the recliner.  She is not getting good rest.  She has not had a fever.  She has had COVID testing which has been negative.  Asthma/Respiratory Symptom History: She did do the Yupelri for a few weeks .  However, she did not feel that it made much of a difference.  She decided to go back to Spiriva 2 puffs at night.  She does use ProAir 2 puffs before using the Spiriva.  However, this is not helped her nighttime wheezing and coughing.  She has never been on Stiolto.  Her [name] is due next week and she would like to get it early since she is here.  However, we did not have the drug delivered yet.  Allergic Rhinitis Symptom History: She remains on her allergen immunotherapy.  This is going well without any large local reactions.  She remains on Singulair 10 mg daily.  She does use her Flonase on a as needed basis. She has not needed any antibiotics prior to the initiation of the doxycycline at the last visit.  GERD Symptom History: She remains on Dexilant 60 mg 1-2 times daily.  She did actually reach out to her sister in [place] whom she does not get along with since her husband was recently murdered.  They did seem to patch things up.  She still is not talking to her sister in [place].  Otherwise, there have been no changes to her past medical history, surgical history, family history, or social history.    Review of Systems  Constitutional: Negative.  Negative for chills, fever, malaise/fatigue and weight loss.  HENT:  Positive for congestion and sinus pain. Negative for ear discharge and ear pain.        Positive for postnasal drip.  Eyes:  Negative for pain, discharge and redness.  Respiratory:  Positive for cough and wheezing. Negative for sputum production and shortness of breath.   Cardiovascular: Negative.  Negative for chest pain and palpitations.   Gastrointestinal:  Negative for abdominal pain, constipation, diarrhea, heartburn, nausea and vomiting.  Skin: Negative.  Negative for itching and rash.  Neurological:  Negative for dizziness and headaches.  Endo/Heme/Allergies:  Positive for environmental allergies. Does not bruise/bleed easily.      Objective:   Blood pressure 120/70, pulse 67, temperature 98.1 F (36.7 C), temperature source Temporal, resp. rate 18, height 5' 3.25" (1.607 m), weight 248 lb 6.4 oz (112.7 kg), SpO2 96 %. Body mass index is 43.65 kg/m.   Physical Exam:  Physical Exam Constitutional:      Appearance: She is well-developed.     Comments: She is now walking without a cane.  HENT:     Head: Normocephalic and atraumatic.     Right Ear: Tympanic membrane, ear canal and external ear normal.     Left Ear: Tympanic membrane, ear canal and external ear normal.     Nose: No nasal deformity, septal deviation, mucosal edema or rhinorrhea.  Right Turbinates: Enlarged and swollen.     Left Turbinates: Enlarged and swollen.     Right Sinus: No maxillary sinus tenderness or frontal sinus tenderness.     Left Sinus: No maxillary sinus tenderness or frontal sinus tenderness.     Comments: Bilateral maxillary sinus pressure.    Mouth/Throat:     Mouth: Mucous membranes are not pale and not dry.     Pharynx: Uvula midline.  Eyes:     General: Lids are normal. No allergic shiner.       Right eye: No discharge.        Left eye: No discharge.     Conjunctiva/sclera: Conjunctivae normal.     Right eye: Right conjunctiva is not injected. No chemosis.    Left eye: Left conjunctiva is not injected. No chemosis.    Pupils: Pupils are equal, round, and reactive to light.  Cardiovascular:     Rate and Rhythm: Normal rate and regular rhythm.     Heart sounds: Normal heart sounds.  Pulmonary:     Effort: Pulmonary effort is normal. No tachypnea, accessory muscle usage or respiratory distress.     Breath sounds:  Normal breath sounds. No wheezing, rhonchi or rales.     Comments: Moving air well in all lung fields.  No increased work of breathing. Chest:     Chest wall: No tenderness.  Lymphadenopathy:     Cervical: No cervical adenopathy.  Skin:    Coloration: Skin is not pale.     Findings: No abrasion, erythema, petechiae or rash. Rash is not papular, urticarial or vesicular.  Neurological:     Mental Status: She is alert.  Psychiatric:        Behavior: Behavior is cooperative.     Diagnostic studies: none       Salvatore Marvel, MD  [facility]

## 2021-05-01 NOTE — Patient Instructions (Addendum)
1. Severe persistent asthma, uncomplicated  - Lung function looked fairly stable today. - We are going to add on Stiolto (combined Spiriva and a long acting albuterol) - Once we get your symptoms under control, we can start talking about taking medications, such as Singulair) away.  - Daily controller medication(s): Fasenra every 8 weeks and Stiolto two puffs once daily (samples provided) - Prior to physical activity: ProAir 2 puffs 10-15 minutes before physical activity. - Rescue medications: ProAir 4 puffs every 4-6 hours as needed or albuterol nebulizer one vial puffs every 4-6 hours as needed - Asthma control goals:  * Full participation in all desired activities (may need albuterol before activity) * Albuterol use two time or less a week on average (not counting use with activity) * Cough interfering with sleep two time or less a month * Oral steroids no more than once a year * No hospitalizations  2. Allergic rhinitis - on allergen immunotherapy (prescription altered April 2018) - Continue with allergy shots at the same schedule. - Continue with Flonase two sprays per nostril daily.  - Continue with the antihistaations as needed.   - We can think about re-testing if there is no improvement with the Stiolto.   3. Acute sinusitis  - Start cefdinir 300mg  twice daily. - We are going to do this for two weeks. - This is a 3rd generation cephalosporin and rarely if ever cross reacts with penicillin.   4. GERD   - Continue with Dexilant 60mg  twice daily.  5. Return in about 2 months (around 07/02/2021).    Please inform us of any Emergency Department visits, hospitalizations, or changes in symptoms. Call us before going to the ED for breathing or allergy symptoms since we might be able to fit you in for a sick visit. Feel free to contact us anytime with any questions, problems, or concerns.  It was a pleasure to see you again today!  Websites that have reliable patient information: 1.  American Academy of Asthma, Allergy, and Immunology: www.aaaai.org 2. Food Allergy Research and Education (FARE): foodallergy.org 3. Mothers of Asthmatics: http://www.asthmacommunitynetwork.org 4. American College of Allergy, Asthma, and Immunology: www.acaai.org   COVID-19 Vaccine Information can be found at: ShippingScam.co.uk For questions related to vaccine distribution or appointments, please email [email or web address] or call [phone].   We realize that you might be concerned about having an allergic reaction to the COVID19 vaccines. To help with that concern, WE ARE OFFERING THE COVID19 VACCINES IN OUR OFFICE! Ask the front desk for dates!     "Like" [place] on Facebook and Instagram for our latest updates!      A healthy democracy works best when [employer] participate! Make sure you are registered to vote! If you have moved or changed any of your contact information, you will need to get this updated before voting!  In some cases, you MAY be able to register to vote online: [email or web address]

## 2021-05-05 ENCOUNTER — Ambulatory Visit (INDEPENDENT_AMBULATORY_CARE_PROVIDER_SITE_OTHER): Payer: Medicare HMO

## 2021-05-05 DIAGNOSIS — J309 Allergic rhinitis, unspecified: Secondary | ICD-10-CM | POA: Diagnosis not present

## 2021-05-07 ENCOUNTER — Telehealth: Payer: Self-pay | Admitting: Allergy & Immunology

## 2021-05-07 ENCOUNTER — Ambulatory Visit: Payer: Self-pay

## 2021-05-07 NOTE — Telephone Encounter (Signed)
Pt states she was in the office last thur, and still is not better, she is still coughing, and throat is raw and not sleeping. Coughing is worse at night.

## 2021-05-07 NOTE — Telephone Encounter (Signed)
Pt is still having issues since visit a week ago

## 2021-05-08 ENCOUNTER — Ambulatory Visit: Payer: Self-pay

## 2021-05-09 MED ORDER — AZITHROMYCIN 250 MG PO TABS: ORAL_TABLET | ORAL | 0 refills | Status: DC

## 2021-05-09 MED ORDER — PREDNISONE 10 MG PO TABS: ORAL_TABLET | ORAL | 0 refills | Status: DC

## 2021-05-09 NOTE — Telephone Encounter (Signed)
I sent in a course of low dose prednisone and a course of azithromycin to [facility]. Please let the patient know.   Salvatore Marvel, MD Allergy and [place] [facility] [place]

## 2021-05-09 NOTE — Telephone Encounter (Signed)
Called patient and left a detailed voicemail informing.

## 2021-05-12 ENCOUNTER — Other Ambulatory Visit: Payer: Self-pay

## 2021-05-12 ENCOUNTER — Ambulatory Visit (INDEPENDENT_AMBULATORY_CARE_PROVIDER_SITE_OTHER): Payer: Medicare HMO | Admitting: *Deleted

## 2021-05-12 DIAGNOSIS — J4550 Severe persistent asthma, uncomplicated: Secondary | ICD-10-CM | POA: Diagnosis not present
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
## 2021-05-19 ENCOUNTER — Ambulatory Visit: Payer: Self-pay

## 2021-05-19 ENCOUNTER — Other Ambulatory Visit: Payer: Self-pay | Admitting: Allergy & Immunology

## 2021-05-23 ENCOUNTER — Telehealth: Payer: Self-pay | Admitting: Allergy & Immunology

## 2021-05-23 MED ORDER — STIOLTO RESPIMAT 2.5-2.5 MCG/ACT IN AERS: 2.0000 | INHALATION_SPRAY | Freq: Every day | RESPIRATORY_TRACT | 3 refills | Status: DC

## 2021-05-23 NOTE — Telephone Encounter (Signed)
[facility]. Pt informed.

## 2021-05-23 NOTE — Telephone Encounter (Signed)
Patient was given a sample of Stioito inhaler 2.5 mcg Asking for an RX for this medication please advise

## 2021-05-26 ENCOUNTER — Other Ambulatory Visit: Payer: Self-pay | Admitting: *Deleted

## 2021-05-26 ENCOUNTER — Telehealth: Payer: Self-pay | Admitting: Allergy & Immunology

## 2021-05-26 MED ORDER — MONTELUKAST SODIUM 10 MG PO TABS: 10.0000 mg | ORAL_TABLET | Freq: Every day | ORAL | 1 refills | Status: DC

## 2021-05-26 NOTE — Telephone Encounter (Signed)
Called and spoke with patient. She stated that she already has an appointment with Dr. Ernst Bowler about her asthma and only wanted her Singulair called in for a 90 day supply. Medication has been sent by [facility].

## 2021-05-26 NOTE — Telephone Encounter (Signed)
Pt states she is still having the same symptoms, coughing(worse at night) she feels like she can't breathe. She is taking meds as prescribed, she would like a call back.

## 2021-05-29 ENCOUNTER — Ambulatory Visit: Payer: Medicare HMO | Admitting: Allergy & Immunology

## 2021-05-29 ENCOUNTER — Other Ambulatory Visit: Payer: Self-pay

## 2021-05-29 ENCOUNTER — Ambulatory Visit: Payer: Self-pay

## 2021-05-29 VITALS — BP 126/82 | HR 80 | Temp 98.3°F | Resp 20

## 2021-05-29 DIAGNOSIS — J4550 Severe persistent asthma, uncomplicated: Secondary | ICD-10-CM | POA: Diagnosis not present

## 2021-05-29 DIAGNOSIS — T7800XD Anaphylactic reaction due to unspecified food, subsequent encounter: Secondary | ICD-10-CM

## 2021-05-29 DIAGNOSIS — K219 Gastro-esophageal reflux disease without esophagitis: Secondary | ICD-10-CM

## 2021-05-29 DIAGNOSIS — R059 Cough, unspecified: Secondary | ICD-10-CM

## 2021-05-29 DIAGNOSIS — J309 Allergic rhinitis, unspecified: Secondary | ICD-10-CM

## 2021-05-29 DIAGNOSIS — B999 Unspecified infectious disease: Secondary | ICD-10-CM | POA: Diagnosis not present

## 2021-05-29 DIAGNOSIS — J3089 Other allergic rhinitis: Secondary | ICD-10-CM

## 2021-05-29 DIAGNOSIS — J302 Other seasonal allergic rhinitis: Secondary | ICD-10-CM

## 2021-05-29 MED ORDER — GUAIFENESIN-CODEINE 100-10 MG/5ML PO SOLN: 10.0000 mL | Freq: Three times a day (TID) | ORAL | 0 refills | Status: DC | PRN

## 2021-05-29 MED ORDER — AZITHROMYCIN 250 MG PO TABS: 250.0000 mg | ORAL_TABLET | Freq: Every day | ORAL | 0 refills | Status: AC

## 2021-05-29 NOTE — Progress Notes (Signed)
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)  Chronic cough - minimally responsive to antibiotics, getting chest x-ray and starting prophylactic azithromycin for its anti-inflammatory effects today   Seasonal and perennial allergic rhinitis (molds, dust mite, grasses, weeds, trees, cat, dog) - on allergen immunotherapy with prescription altered April 2018   Acute sinusitis with continued symptoms despite 10 days of doxycycline twice daily (consider sinus CT at the next visit if continued symptoms)   Gastroesophageal reflux disease   Anaphylactic shock due to food (shellfish)    Recent prolonged hospitalization due to LLE fall and left knee replacement  Plan/Recommendations:   1. Severe persistent asthma, uncomplicated  - Lung function not done today, but you sounded pretty good. - We are going to get a chest X-ray. - We are going to refer you to see Pulmonology in [facility] for an evaluation of your cough.  - We are also starting daily azithromycin 250 mg to see if this helps your coughing.  - Daily controller medication(s): Fasenra every 8 weeks and Stiolto two puffs once daily and azithromycin '250mg'$  daily - Prior to physical activity: ProAir RespiClick 2 puffs [id number] minutes before physical activity. - Rescue medications: ProAir 4 puffs every 4-6 hours as needed or albuterol nebulizer one vial puffs every 4-6 hours as needed - Asthma control goals:  * Full participation in all desired activities (may need albuterol before activity) * Albuterol use two time or less a week on average (not counting use with activity) * Cough interfering with sleep two time or less a month * Oral steroids no more than once a year * No hospitalizations  2. Allergic rhinitis - on allergen immunotherapy (prescription altered April 2018) - Continue with allergy shots at the same schedule. - Continue with  Flonase two sprays per nostril daily.  - Continue with the antihistaations as needed.   - We can think about re-testing if there is no improvement with the Stiolto.   3. Recurrent infections - We will obtain some screening labs to evaluate your immune system.  - Labs to evaluate the quantitative [facility]) aspects of your immune system: IgG/IgA/IgM, CBC with differential - Labs to evaluate the qualitative ([place]) aspects of your immune system: CH50, Pneumococcal titers, Tetanus titers, Diphtheria titers - We may consider immunizations with Pneumovax and Tdap to challenge your immune system, and then obtain repeat titers in 4-6 weeks.   4. GERD   - Continue with Dexilant '60mg'$  in the morning and Nexium at night.   5. Return in about 4 weeks (around 06/26/2021).     Subjective:   Joanna [name] is a 55 y.o. female presenting today for follow up of  Chief Complaint  Patient presents with   Follow-up    Pt reports still no major improvements in symptom. Still coughing greenish yellow sputum. Pt states unnoticeable improvements in Asthma symptoms due to coughing and congestion.   Nasal Congestion   Asthma    Joanna [name] has a history of the following: Patient Active Problem List   Diagnosis Date Noted   Moderate persistent asthma without complication 123XX123   Seasonal and perennial allergic rhinitis 04/16/2017   Impingement syndrome of both shoulders 11/17/2016   History of insect sting allergy 06/03/2016   Thrush 06/03/2016   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   Primary immune deficiency disorder ([place]) 05/15/2016   Immunodeficiency ([place]) 05/15/2016  Allergic rhinitis due to pollen 05/15/2016   Insect sting allergy, current reaction 05/15/2016   Severe persistent asthma 05/12/2016   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   Essential hypertension 05/12/2016   Gastroesophageal reflux disease 05/12/2016   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  Joanna [name] is a 55 y.o. female presenting for a sick visit.  She was last seen in July 2022.  At that time, her lung function looks stable.  She continued to endorse a cough.  We changed her from Spiriva to Stiolto 2 puffs once daily.  She had quite a few Spiriva at home that she wanted to use up first.  We continue with [name] every 8 weeks as well as Singulair.  For her rhinitis, we continued on Flonase as well as antihistamines.  She was also doing well on allergy shots.  We ended up diagnosing her with acute sinusitis and started her on cefdinir 300 mg twice daily for 2 weeks.  We also continued with her Dexilant.  Since last visit, she has continued to have the same productive cough. This is despite the doxycycline and the cefdinir.  We also sent in azithromycin shortly after her last visit since the coughing was continuing.  Azithromycin did help somewhat.  She was up coughing throughout the night. The ProAir RespiClick seems to cause more improvement with her SOB.  She has been using her Spiriva at home.  She remains on [place] and is up-to-date. She has never seen pulmonology for the cough.  She is coughing during the day as well. She keeps a box of Kleenex near her chair.  Her cough is productive of clear mucus.  She has had no fever during this time.  Overall, the coughing is continued for a period of 3 months.  She hates waking her husband up with the coughing.  She thinks that it started when the trees started blowing.  In particular, she associates her symptoms with mimosas and crape myrtles.  She keeps feeling that this is all allergy triggered.  We did review her allergen immunotherapy and she is covered for nearly all the pollens as well as a scattering of other items.  She is doing Dexilant in the morning and Nexium at night. She has been on Pepcid and all sorts of other H2 blockers  without improvement in her symptoms. She has been on Prilosec and others; none of these seem to have worked.  Otherwise, there have been no changes to her past medical history, surgical history, family history, or social history.    Review of Systems  Constitutional: Negative.  Negative for chills, fever, malaise/fatigue and weight loss.  HENT: Negative.  Negative for congestion, ear discharge and ear pain.   Eyes:  Negative for pain, discharge and redness.  Respiratory:  Positive for cough and shortness of breath. Negative for sputum production and wheezing.   Cardiovascular: Negative.  Negative for chest pain and palpitations.  Gastrointestinal:  Negative for abdominal pain, heartburn, nausea and vomiting.  Skin: Negative.  Negative for itching and rash.  Neurological:  Negative for dizziness and headaches.  Endo/Heme/Allergies:  Negative for environmental allergies. Does not bruise/bleed easily.  All other systems reviewed and are negative.     Objective:   Blood pressure 126/82, pulse 80, temperature 98.3 F (36.8 C), temperature source Temporal, resp. rate 20, SpO2 98 %. There is no height or weight on file  to calculate BMI.   Physical Exam:  Physical Exam Constitutional:      Appearance: She is well-developed.     Comments: Hoarse voice.  HENT:     Head: Normocephalic and atraumatic.     Right Ear: Tympanic membrane, ear canal and external ear normal.     Left Ear: Tympanic membrane, ear canal and external ear normal.     Nose: No nasal deformity, septal deviation, mucosal edema or rhinorrhea.     Right Turbinates: Enlarged and swollen.     Left Turbinates: Enlarged and swollen.     Right Sinus: No maxillary sinus tenderness or frontal sinus tenderness.     Left Sinus: No maxillary sinus tenderness or frontal sinus tenderness.     Mouth/Throat:     Mouth: Mucous membranes are not pale and not dry.     Pharynx: Uvula midline.  Eyes:     General: Lids are normal. No  allergic shiner.       Right eye: No discharge.        Left eye: No discharge.     Conjunctiva/sclera: Conjunctivae normal.     Right eye: Right conjunctiva is not injected. No chemosis.    Left eye: Left conjunctiva is not injected. No chemosis.    Pupils: Pupils are equal, round, and reactive to light.  Cardiovascular:     Rate and Rhythm: Normal rate and regular rhythm.     Heart sounds: Normal heart sounds.  Pulmonary:     Effort: Pulmonary effort is normal. No tachypnea, accessory muscle usage or respiratory distress.     Breath sounds: Normal breath sounds. No wheezing, rhonchi or rales.     Comments: Moving air well in all lung fields.  No increased work of breathing. Chest:     Chest wall: No tenderness.  Lymphadenopathy:     Cervical: No cervical adenopathy.  Skin:    General: Skin is warm.     Capillary Refill: Capillary refill takes less than 2 seconds.     Coloration: Skin is not pale.     Findings: No abrasion, erythema, petechiae or rash. Rash is not papular, urticarial or vesicular.     Comments: No eczematous or urticarial lesions.  Neurological:     Mental Status: She is alert.  Psychiatric:        Behavior: Behavior is cooperative.     Diagnostic studies: none  Spirometry: Stable compared to previous spirometry's      Salvatore Marvel, MD  Allergy and [place] [facility] [place]

## 2021-05-29 NOTE — Patient Instructions (Addendum)
1. Severe persistent asthma, uncomplicated  - Lung function not done today, but you sounded pretty good. - We are going to get a chest X-ray. - We are going to refer you to see Pulmonology in [facility] for an evaluation of your cough.  - We are also starting daily azithromycin 250 mg to see if this helps your coughing.  - Daily controller medication(s): Fasenra every 8 weeks and Stiolto two puffs once daily and azithromycin '250mg'$  daily - Prior to physical activity: ProAir RespiClick 2 puffs [id number] minutes before physical activity. - Rescue medications: ProAir 4 puffs every 4-6 hours as needed or albuterol nebulizer one vial puffs every 4-6 hours as needed - Asthma control goals:  * Full participation in all desired activities (may need albuterol before activity) * Albuterol use two time or less a week on average (not counting use with activity) * Cough interfering with sleep two time or less a month * Oral steroids no more than once a year * No hospitalizations  2. Allergic rhinitis - on allergen immunotherapy (prescription altered April 2018) - Continue with allergy shots at the same schedule. - Continue with Flonase two sprays per nostril daily.  - Continue with the antihistaations as needed.   - We can think about re-testing if there is no improvement with the Stiolto.   3. Recurrent infections - We will obtain some screening labs to evaluate your immune system.  - Labs to evaluate the quantitative [facility]) aspects of your immune system: IgG/IgA/IgM, CBC with differential - Labs to evaluate the qualitative ([place]) aspects of your immune system: CH50, Pneumococcal titers, Tetanus titers, Diphtheria titers - We may consider immunizations with Pneumovax and Tdap to challenge your immune system, and then obtain repeat titers in 4-6 weeks.   4. GERD   - Continue with Dexilant '60mg'$  in the morning and Nexium at night.   5. Return in about 4 weeks (around 06/26/2021).     Please inform us of any Emergency Department visits, hospitalizations, or changes in symptoms. Call us before going to the ED for breathing or allergy symptoms since we might be able to fit you in for a sick visit. Feel free to contact us anytime with any questions, problems, or concerns.  It was a pleasure to see you again today!  Websites that have reliable patient information: 1. American Academy of Asthma, Allergy, and Immunology: www.aaaai.org 2. Food Allergy Research and Education (FARE): foodallergy.org 3. Mothers of Asthmatics: http://www.asthmacommunitynetwork.org 4. American College of Allergy, Asthma, and Immunology: www.acaai.org   COVID-19 Vaccine Information can be found at: ShippingScam.co.uk For questions related to vaccine distribution or appointments, please email [email or web address] or call [phone].   We realize that you might be concerned about having an allergic reaction to the COVID19 vaccines. To help with that concern, WE ARE OFFERING THE COVID19 VACCINES IN OUR OFFICE! Ask the front desk for dates!     "Like" [place] on Facebook and Instagram for our latest updates!      A healthy democracy works best when [employer] participate! Make sure you are registered to vote! If you have moved or changed any of your contact information, you will need to get this updated before voting!  In some cases, you MAY be able to register to vote online: [email or web address]

## 2021-05-30 ENCOUNTER — Encounter: Payer: Self-pay | Admitting: Allergy & Immunology

## 2021-06-02 ENCOUNTER — Other Ambulatory Visit: Payer: Self-pay

## 2021-06-02 ENCOUNTER — Other Ambulatory Visit: Payer: Self-pay | Admitting: Allergy & Immunology

## 2021-06-02 ENCOUNTER — Telehealth: Payer: Self-pay

## 2021-06-02 ENCOUNTER — Ambulatory Visit (HOSPITAL_BASED_OUTPATIENT_CLINIC_OR_DEPARTMENT_OTHER)
Admission: RE | Admit: 2021-06-02 | Discharge: 2021-06-02 | Disposition: A | Discharge status: Home / Self Care | Payer: 59 | Source: Ambulatory Visit | Attending: Allergy & Immunology | Admitting: Allergy & Immunology

## 2021-06-02 DIAGNOSIS — J4550 Severe persistent asthma, uncomplicated: Secondary | ICD-10-CM | POA: Diagnosis present

## 2021-06-02 DIAGNOSIS — R059 Cough, unspecified: Secondary | ICD-10-CM | POA: Diagnosis present

## 2021-06-02 IMAGING — DX DG CHEST 2V
2 series · 2 of 2 positions shown · non-contrast
Comparison: [DATE].

CLINICAL DATA: Cough.

EXAM:
CHEST - 2 VIEW

[chest pa]
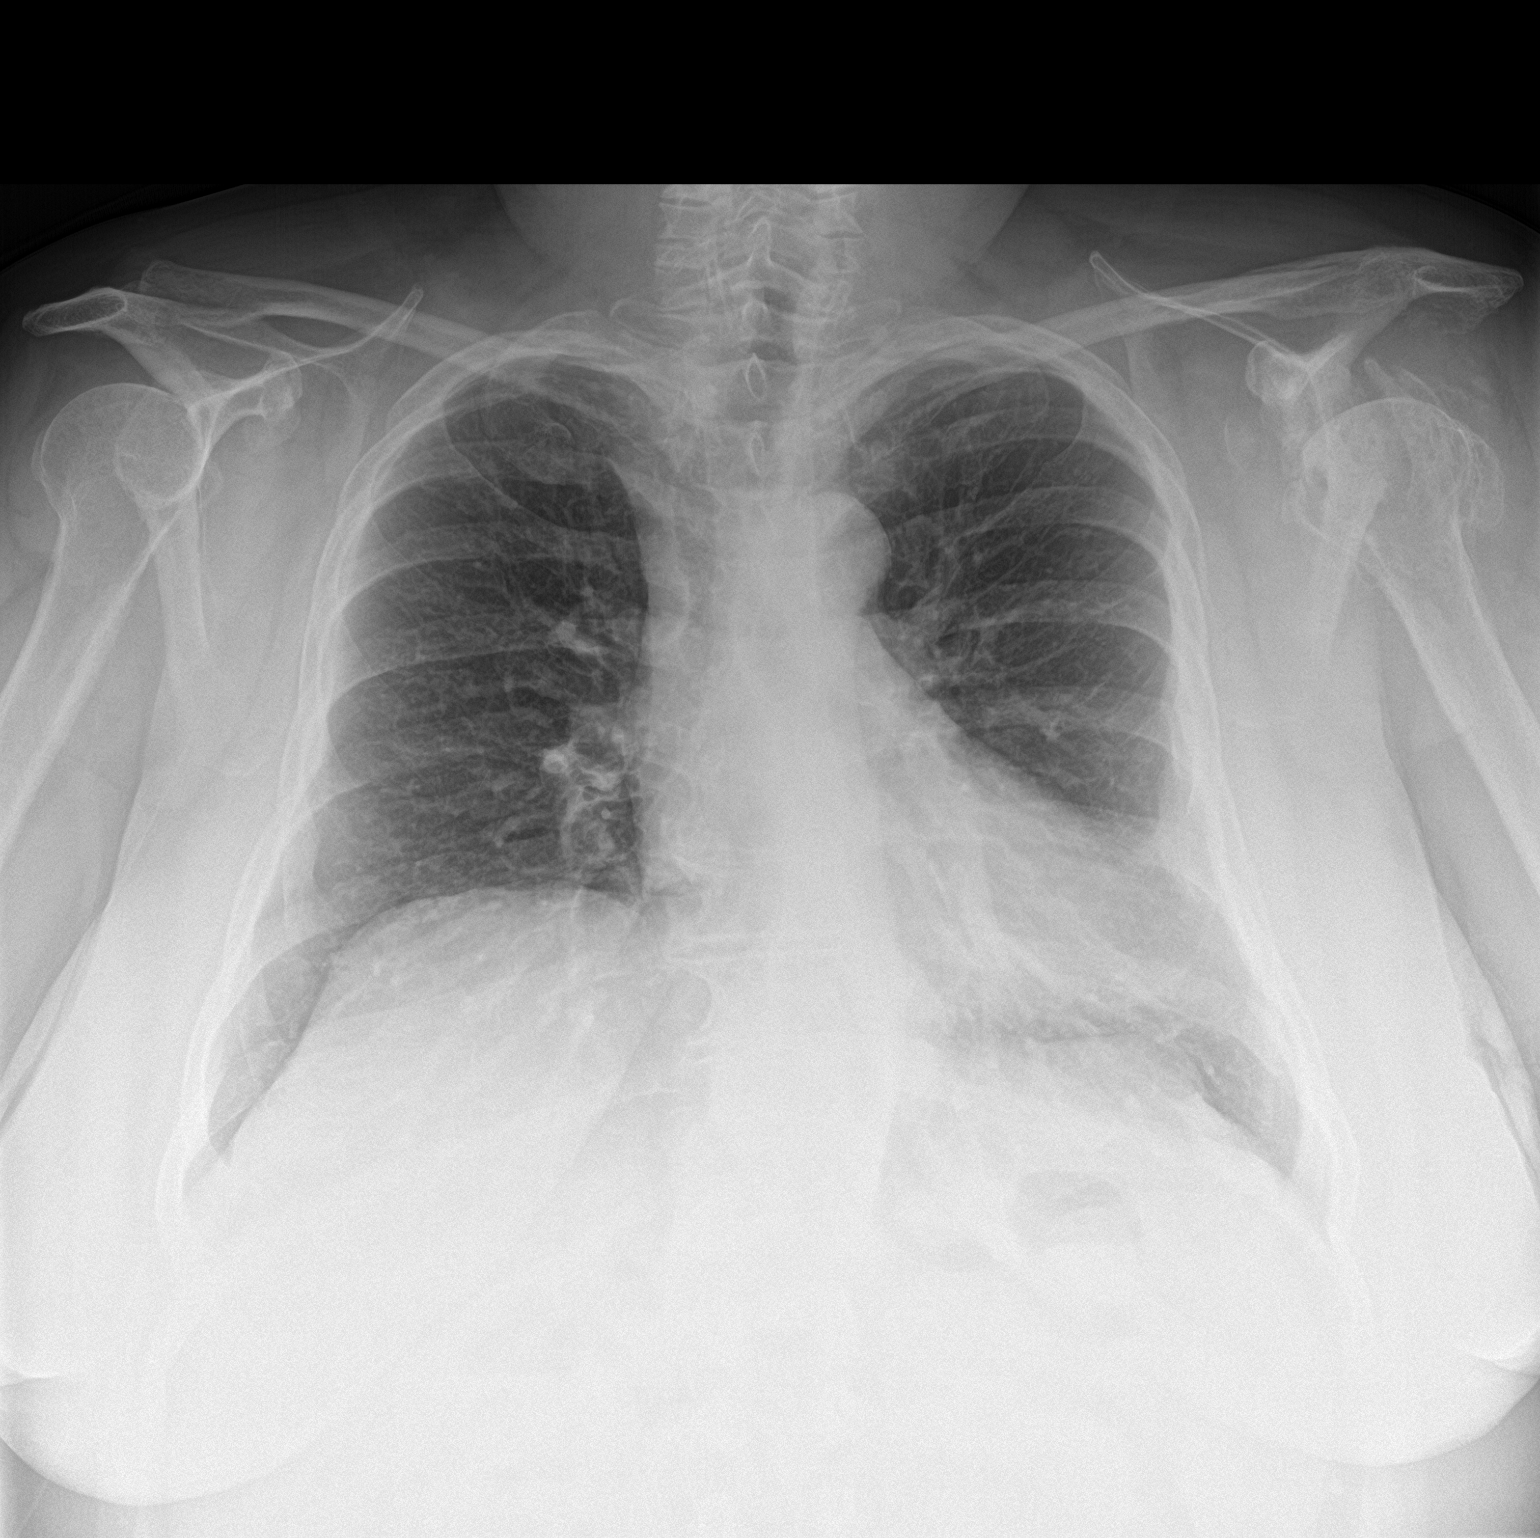

[chest lat]
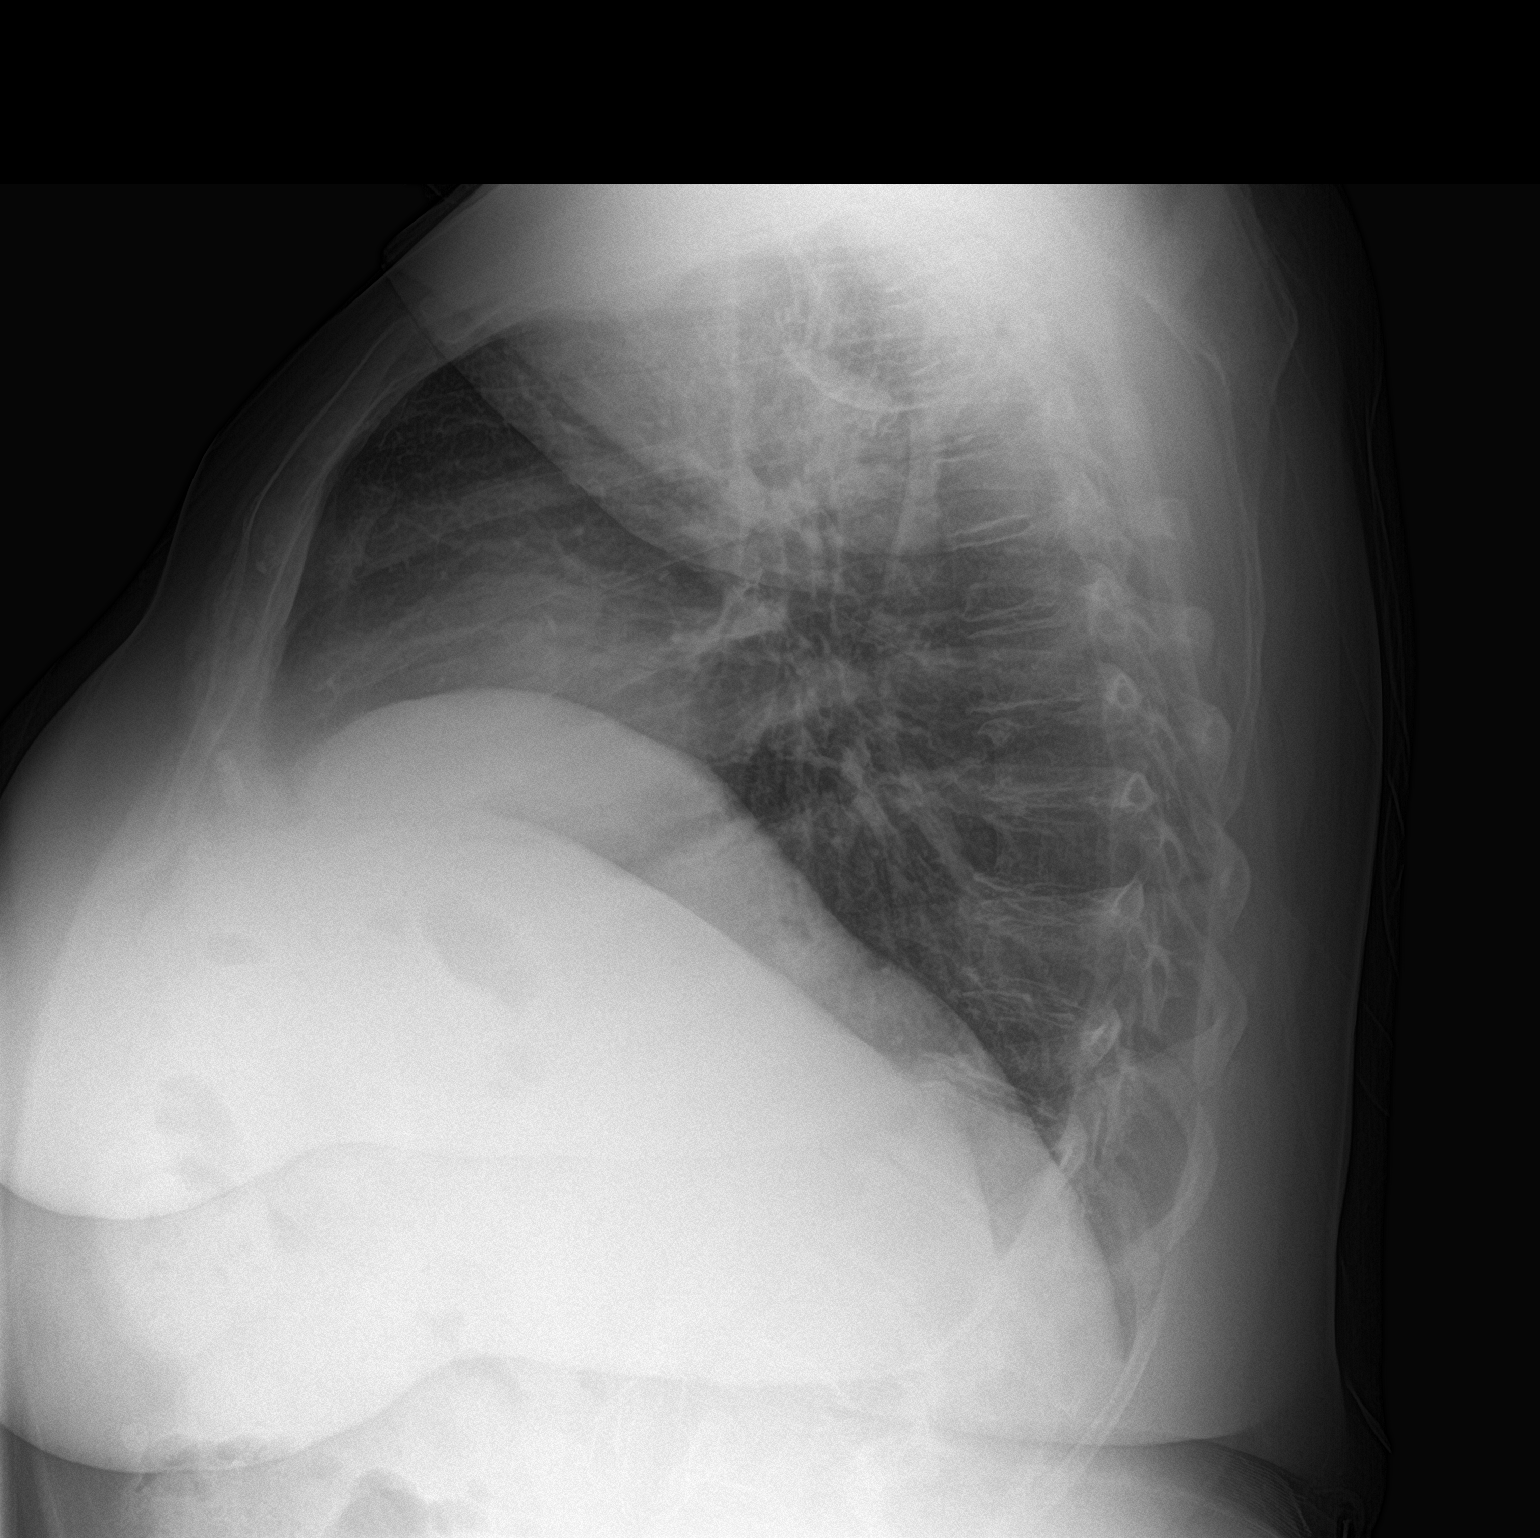

[2 of 2 positions shown; findings below may reference images not displayed]

FINDINGS: The heart size and mediastinal contours are within normal limits.
Both lungs are clear. The visualized skeletal structures are
unremarkable.
IMPRESSION: No active cardiopulmonary disease.

## 2021-06-02 NOTE — Telephone Encounter (Signed)
I called and left the patient a voicemail to see if she is okay with going to [facility]monary in [facility] as [facility] Pulmonology in [facility] has been closed for about 3 Years now. She could be seen at the [facility] Location if she is wanting to keep her care within [facility].

## 2021-06-02 NOTE — Telephone Encounter (Signed)
-----   Message from Valentina Shaggy, MD sent at 05/30/2021  1:38 PM EDT ----- Pulmonology referral placed.

## 2021-06-02 NOTE — Telephone Encounter (Signed)
That is fine - sorry I did not realize that!   Salvatore Marvel, MD Allergy and [place] [facility] [place]

## 2021-06-03 NOTE — Telephone Encounter (Signed)
Patient called back and requested we hold off on sending the referral out to [facility]. Patient states she just went for some labs and chest x-rays to be done. Patient stated she will call back when she is ready.

## 2021-06-03 NOTE — Telephone Encounter (Signed)
Thank you Carla .

## 2021-06-05 NOTE — Telephone Encounter (Signed)
Can someone call the patient back to let her know that her chest x-ray was normal?  We are still waiting on a lot of the blood results, but she has a normal complete blood count as well as protection to tetanus and diphtheria.  Her immunoglobulin levels are normal as well.  Salvatore Marvel, MD Allergy and [place] [facility] [place]

## 2021-06-05 NOTE — Telephone Encounter (Signed)
Pt informed of this and stated her understanding

## 2021-06-11 ENCOUNTER — Other Ambulatory Visit: Payer: Self-pay | Admitting: *Deleted

## 2021-06-11 MED ORDER — BENRALIZUMAB 30 MG/ML ~~LOC~~ SOSY: 30.0000 mg | PREFILLED_SYRINGE | SUBCUTANEOUS | 6 refills | Status: DC

## 2021-06-13 DIAGNOSIS — J3089 Other allergic rhinitis: Secondary | ICD-10-CM

## 2021-06-13 NOTE — Progress Notes (Signed)
Exp 06/13/22

## 2021-06-16 DIAGNOSIS — J301 Allergic rhinitis due to pollen: Secondary | ICD-10-CM

## 2021-06-18 ENCOUNTER — Ambulatory Visit (INDEPENDENT_AMBULATORY_CARE_PROVIDER_SITE_OTHER): Payer: 59 | Admitting: Family

## 2021-06-18 ENCOUNTER — Encounter: Payer: Self-pay | Admitting: Family

## 2021-06-18 DIAGNOSIS — Z89511 Acquired absence of right leg below knee: Secondary | ICD-10-CM

## 2021-06-18 LAB — STREP PNEUMONIAE 23 SEROTYPES IGG
Pneumo Ab Type 1*: 0.1 ug/mL — ABNORMAL LOW (ref >1.3)
Pneumo Ab Type 12 (12F)*: 0.2 ug/mL — ABNORMAL LOW (ref >1.3)
Pneumo Ab Type 14*: 1.4 ug/mL (ref >1.3)
Pneumo Ab Type 17 (17F)*: 0.2 ug/mL — ABNORMAL LOW (ref >1.3)
Pneumo Ab Type 19 (19F)*: 0.4 ug/mL — ABNORMAL LOW (ref >1.3)
Pneumo Ab Type 2*: 0.3 ug/mL — ABNORMAL LOW (ref >1.3)
Pneumo Ab Type 20*: 0.6 ug/mL — ABNORMAL LOW (ref >1.3)
Pneumo Ab Type 22 (22F)*: <0.1 ug/mL — ABNORMAL LOW (ref >1.3)
Pneumo Ab Type 23 (23F)*: <0.1 ug/mL — ABNORMAL LOW (ref >1.3)
Pneumo Ab Type 26 (6B)*: <0.1 ug/mL — ABNORMAL LOW (ref >1.3)
Pneumo Ab Type 3*: 0.2 ug/mL — ABNORMAL LOW (ref >1.3)
Pneumo Ab Type 34 (10A)*: 0.2 ug/mL — ABNORMAL LOW (ref >1.3)
Pneumo Ab Type 4*: 0.2 ug/mL — ABNORMAL LOW (ref >1.3)
Pneumo Ab Type 43 (11A)*: 0.1 ug/mL — ABNORMAL LOW (ref >1.3)
Pneumo Ab Type 5*: <0.1 ug/mL — ABNORMAL LOW (ref >1.3)
Pneumo Ab Type 51 (7F)*: <0.1 ug/mL — ABNORMAL LOW (ref >1.3)
Pneumo Ab Type 54 (15B)*: 0.3 ug/mL — ABNORMAL LOW (ref >1.3)
Pneumo Ab Type 56 (18C)*: <0.1 ug/mL — ABNORMAL LOW (ref >1.3)
Pneumo Ab Type 57 (19A)*: 2.3 ug/mL (ref >1.3)
Pneumo Ab Type 68 (9V)*: 0.5 ug/mL — ABNORMAL LOW (ref >1.3)
Pneumo Ab Type 70 (33F)*: 0.3 ug/mL — ABNORMAL LOW (ref >1.3)
Pneumo Ab Type 8*: 0.2 ug/mL — ABNORMAL LOW (ref >1.3)
Pneumo Ab Type 9 (9N)*: <0.1 ug/mL — ABNORMAL LOW (ref >1.3)

## 2021-06-18 LAB — IGG, IGA, IGM
IgA/Immunoglobulin A, Serum: 125 mg/dL (ref 87–352)
IgG (Immunoglobin G), Serum: 631 mg/dL (ref 586–1602)
IgM (Immunoglobulin M), Srm: 82 mg/dL (ref 26–217)

## 2021-06-18 LAB — CBC WITH DIFFERENTIAL
Basophils Absolute: 0 10*3/uL (ref 0.0–0.2)
Basos: 0 %
EOS (ABSOLUTE): 0 10*3/uL (ref 0.0–0.4)
Eos: 0 %
Hematocrit: 40.7 % (ref 34.0–46.6)
Hemoglobin: 14.2 g/dL (ref 11.1–15.9)
Immature Grans (Abs): 0 10*3/uL (ref 0.0–0.1)
Immature Granulocytes: 0 %
Lymphocytes Absolute: 1.8 10*3/uL (ref 0.7–3.1)
Lymphs: 24 %
MCH: 30.8 pg (ref 26.6–33.0)
MCHC: 34.9 g/dL (ref 31.5–35.7)
MCV: 88 fL (ref 79–97)
Monocytes Absolute: 0.6 10*3/uL (ref 0.1–0.9)
Monocytes: 7 %
Neutrophils Absolute: 5.3 10*3/uL (ref 1.4–7.0)
Neutrophils: 69 %
RBC: 4.61 x10E6/uL (ref 3.77–5.28)
RDW: 13.6 % (ref 11.7–15.4)
WBC: 7.7 10*3/uL (ref 3.4–10.8)

## 2021-06-18 LAB — DIPHTHERIA / TETANUS ANTIBODY PANEL
Diphtheria Ab: 0.35 IU/mL (ref <0.10)
Tetanus Ab, IgG: 1.19 IU/mL (ref <0.10)

## 2021-06-18 LAB — COMPLEMENT, TOTAL: Compl, Total (CH50): >60 U/mL (ref >41)
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
## 2021-06-19 ENCOUNTER — Ambulatory Visit: Payer: Medicare HMO | Admitting: Allergy & Immunology

## 2021-06-25 ENCOUNTER — Other Ambulatory Visit: Payer: Self-pay

## 2021-06-25 MED ORDER — AZITHROMYCIN 250 MG PO TABS: ORAL_TABLET | ORAL | 3 refills | Status: DC

## 2021-06-26 ENCOUNTER — Ambulatory Visit (INDEPENDENT_AMBULATORY_CARE_PROVIDER_SITE_OTHER): Payer: Medicare HMO

## 2021-06-26 DIAGNOSIS — J309 Allergic rhinitis, unspecified: Secondary | ICD-10-CM

## 2021-07-07 ENCOUNTER — Ambulatory Visit: Payer: Self-pay

## 2021-07-07 ENCOUNTER — Ambulatory Visit (INDEPENDENT_AMBULATORY_CARE_PROVIDER_SITE_OTHER): Payer: Medicare HMO

## 2021-07-07 ENCOUNTER — Other Ambulatory Visit: Payer: Self-pay

## 2021-07-07 DIAGNOSIS — J4550 Severe persistent asthma, uncomplicated: Secondary | ICD-10-CM

## 2021-07-08 ENCOUNTER — Telehealth: Payer: Self-pay | Admitting: Allergy & Immunology

## 2021-07-08 NOTE — Telephone Encounter (Signed)
Pt states she was taking zpack everyday for 30days then started taking it 1x 3 times a week.per Dr. Ernst Bowler. Since she started the three times a week pt has noticed more coughing and green phlegm. Pt would like a call back.

## 2021-07-09 MED ORDER — AZITHROMYCIN 250 MG PO TABS: 250.0000 mg | ORAL_TABLET | Freq: Every day | ORAL | 3 refills | Status: DC

## 2021-07-09 NOTE — Telephone Encounter (Signed)
I would have appreciated more information, Joanna [name]. And she should have been called yesterday.   But I called the patient this evening. She is having a lot of SOB and green mucous production. She was having difficulty breathing. She has been having trouble making the bed. She is not having energy to make the bed.   She was doing azithromycin daily and it seemed like she was doing better. She was doing more downhill once she switched it to three times weekly. She thinks that we need to go back to the every day.   I sent in 250 mg daily every day with 90 day supply. Confirmed pharmacy.  I will see her in [place] on October 3rd at 3:15pm.  Salvatore Marvel, MD [facility]

## 2021-07-09 NOTE — Telephone Encounter (Signed)
Please advise. Thanks.  

## 2021-07-09 NOTE — Telephone Encounter (Signed)
Thanks, Beth! I could not schedule on blocked slots, so I appreciate you doing that for me!   Salvatore Marvel, MD [facility] [place]

## 2021-07-17 ENCOUNTER — Telehealth: Payer: Self-pay | Admitting: Family

## 2021-07-17 NOTE — Telephone Encounter (Signed)
06/18/21 ov note faxed to [facility] with order for liners and sock

## 2021-07-23 ENCOUNTER — Ambulatory Visit (INDEPENDENT_AMBULATORY_CARE_PROVIDER_SITE_OTHER): Payer: Medicare HMO

## 2021-07-23 DIAGNOSIS — J309 Allergic rhinitis, unspecified: Secondary | ICD-10-CM | POA: Diagnosis not present

## 2021-07-26 HISTORY — PX: TOTAL KNEE ARTHROPLASTY: SHX125

## 2021-07-28 ENCOUNTER — Ambulatory Visit: Payer: Medicare HMO | Admitting: Allergy & Immunology

## 2021-07-30 ENCOUNTER — Other Ambulatory Visit: Payer: Self-pay | Admitting: Allergy & Immunology

## 2021-08-04 ENCOUNTER — Ambulatory Visit: Payer: Medicare HMO | Admitting: Internal Medicine

## 2021-08-04 ENCOUNTER — Encounter: Payer: Self-pay | Admitting: Internal Medicine

## 2021-08-04 ENCOUNTER — Ambulatory Visit: Payer: Self-pay

## 2021-08-04 ENCOUNTER — Other Ambulatory Visit: Payer: Self-pay

## 2021-08-04 VITALS — BP 136/76 | HR 77 | Temp 98.1°F | Resp 16 | Ht 63.25 in

## 2021-08-04 DIAGNOSIS — J3089 Other allergic rhinitis: Secondary | ICD-10-CM | POA: Diagnosis not present

## 2021-08-04 DIAGNOSIS — Z23 Encounter for immunization: Secondary | ICD-10-CM | POA: Diagnosis not present

## 2021-08-04 DIAGNOSIS — K219 Gastro-esophageal reflux disease without esophagitis: Secondary | ICD-10-CM

## 2021-08-04 DIAGNOSIS — J302 Other seasonal allergic rhinitis: Secondary | ICD-10-CM

## 2021-08-04 DIAGNOSIS — T7800XD Anaphylactic reaction due to unspecified food, subsequent encounter: Secondary | ICD-10-CM

## 2021-08-04 DIAGNOSIS — J4550 Severe persistent asthma, uncomplicated: Secondary | ICD-10-CM

## 2021-08-04 DIAGNOSIS — R053 Chronic cough: Secondary | ICD-10-CM

## 2021-08-04 MED ORDER — IPRATROPIUM BROMIDE 0.03 % NA SOLN: 2.0000 | Freq: Every day | NASAL | 12 refills | Status: DC | PRN

## 2021-08-04 MED ORDER — PREDNISONE 20 MG PO TABS: 40.0000 mg | ORAL_TABLET | Freq: Every day | ORAL | 0 refills | Status: AC
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
## 2021-08-04 NOTE — Patient Instructions (Addendum)
1. Severe persistent asthma, uncomplicated - Lung function looks restricted today. - We are going to get a chest CT. - We are going to refer you to see Pulmonology in [facility] for an evaluation of your cough.  - Daily controller medication(s): Fasenra every 8 weeks and Stiolto two puffs once daily and (azithromycin 250mg  daily -continue for 3 month trial) - Prior to physical activity: ProAir RespiClick 2 puffs 51-10 minutes before physical activity. - Rescue medications: ProAir 4 puffs every 4-6 hours as needed or albuterol nebulizer one vial puffs every 4-6 hours as needed - Asthma control goals:  * Full participation in all desired activities (may need albuterol before activity) * Albuterol use two time or less a week on average (not counting use with activity) * Cough interfering with sleep two time or less a month * Oral steroids no more than once a year * No hospitalizations  2. Allergic rhinitis - on allergen immunotherapy (prescription altered April 2018) - Continue with allergy shots at the same schedule. - Continue with Flonase two sprays per nostril daily.  - Start Atrovent (ipatopium) nasal spray 1-2 sprays in each nostril at night AS NEEDED for POST NASAL DRIP/RUNNY NOSE/DRAINAGE.  If you become too dry, use less often.  DO NOT CONTINUE IF HAVING ISSUES URINATING AS THIS CAN MAKE YOUR SYMPTOMS WORSE.  - Continue with the antihistaations as needed.    3. Recurrent infections - Pneumovax vaccine today - we will repeat these titers in 6 weeks at your follow-up.  4. GERD   - Continue with Dexilant 60mg  in the morning and Nexium at night.   5. Chronic cough: - likely multifactorial including from asthma, drainage, reflux - will get sinus CT - imaging to look at your sinuses to see if this is where mucus production is coming from - trial atrovent as above  6. Follow-up in 6 weeks   Please inform us of any Emergency Department visits, hospitalizations, or changes in  symptoms. Call us before going to the ED for breathing or allergy symptoms since we might be able to fit you in for a sick visit. Feel free to contact us anytime with any questions, problems, or concerns.  It was a pleasure to meet you today!

## 2021-08-11 ENCOUNTER — Telehealth (HOSPITAL_BASED_OUTPATIENT_CLINIC_OR_DEPARTMENT_OTHER): Payer: Self-pay

## 2021-08-11 ENCOUNTER — Telehealth: Payer: Self-pay

## 2021-08-11 NOTE — Telephone Encounter (Signed)
Referral has been placed. I called and left a detailed voicemail for the patient regarding her referral.   Pulmonology - [facility] [address] [place],  [address] [phone]

## 2021-08-11 NOTE — Telephone Encounter (Signed)
-----   Message from Sigurd Sos, MD sent at 08/04/2021  5:25 PM EDT ----- Good afternoon-patient was referred to Pulmonology but did not go.  She is now interested in going.  Can you help get this set-up? Does she need a new referral order?   Thanks!

## 2021-08-11 NOTE — Telephone Encounter (Signed)
Hey Yes,  Would you like to use the same diagnosis as before?  J45.50 (ICD-10-CM) - Severe persistent asthma, uncomplicated E69.5 ([id number]) - Cough

## 2021-08-12 ENCOUNTER — Ambulatory Visit (INDEPENDENT_AMBULATORY_CARE_PROVIDER_SITE_OTHER): Payer: Medicare HMO

## 2021-08-12 DIAGNOSIS — J309 Allergic rhinitis, unspecified: Secondary | ICD-10-CM | POA: Diagnosis not present

## 2021-08-14 ENCOUNTER — Telehealth: Payer: Self-pay

## 2021-08-14 NOTE — Telephone Encounter (Signed)
pRe cert for maxo facial A [id number] Pre cert for ches thigh res A [id number]  Informed [facility] at [facility] and pt of the approval

## 2021-08-18 ENCOUNTER — Telehealth: Payer: Self-pay | Admitting: Internal Medicine

## 2021-08-18 NOTE — Telephone Encounter (Signed)
Pt has appointment for CT on Thursday 08-21-2021. Patient has a CT for her lung and they need to use the IV Contrast for scan. Patient states she is allergic to IV Contrast, patient did advise them that she is allergic. Patient was told to reach out to her allergist to receive a preventative so she does not have a reaction. Patient would like to know what preventative can be given to her.   Best contact number: [phone]

## 2021-08-18 NOTE — Telephone Encounter (Signed)
Called and left her a VM to return call.  We need details of her actual reaction to contrast prior to deciding what we can use as a preventative.  Typically would use prednisone the night before CT and then again an hour prior to CT along with Benadryl.  However, if she has had a severe reaction, we should get CT without contrast.   If she calls back, please have whoever speaks with her collect this data so that we can decide what would work best for her.   Thanks!

## 2021-08-19 NOTE — Telephone Encounter (Signed)
Patient returned phone call.   Patient's reaction to contrast was "one big hive" patient had hives head to toe and it lasted a few hours. Patient stated she was in the hospital and they kept her for observation in for a few hours but by the next ay they were gone. Patient had this reaction about 30-35 years ago.   Patient did state she had a scan done and aspirated so a CT was done of her chest last year. The doctor did use the contrast but patient was given prep stuff before. Patient is unsure of what exactly was given to her- she thinks maybe prednisone and some allergy medication- but she did not have a reaction. It was sometime last October.

## 2021-08-19 NOTE — Telephone Encounter (Signed)
Awesome - thanks for taking such good care of her!  Sounds like we can pre-treat with prednisone and diphenhydramine (if that is [place] with Dr. Simona Huh):  Three doses of 50 mg oral prednisone administered 13, 7, and 1 hour prior to contrast administration, plus 50 mg oral diphenhydramine administered 1 hour prior to contrast administration  Salvatore Marvel, MD [facility] [facility] [place]

## 2021-08-19 NOTE — Telephone Encounter (Signed)
I received a call from [place] in [facility] stating that they had a Pre Cert for CT Chest with Contrast but that they needed one for CT Chest without Contrast. I called her insurance and spoke with them and opened a case. They stated that [place] is out of her network but they are working to see if they can get it approved. A new case has been opened and most recent clinic note has been faxed to [phone]. Reference #[id number].

## 2021-08-19 NOTE — Telephone Encounter (Signed)
09/17/2021  4:00 PM    Providers  Emeterio Reeve, PA-C Physician Assistant NPI: [id number] [address] [place] [address]   Phone: [phone]

## 2021-08-20 NOTE — Telephone Encounter (Signed)
Yes the first Pre Cert that was done was for a CT Chest with Contrast, So I had to resubmit for  CT Chest w/o which is currently pending. I did just have to call and update the NPI number, it is currently under review. Hopefully it will be approved today since her CT is scheduled for tomorrow. The plan though is that it will be without contrast.

## 2021-08-21 ENCOUNTER — Ambulatory Visit (HOSPITAL_BASED_OUTPATIENT_CLINIC_OR_DEPARTMENT_OTHER)
Admission: RE | Admit: 2021-08-21 | Discharge: 2021-08-21 | Disposition: A | Discharge status: Home / Self Care | Payer: Medicare HMO | Source: Ambulatory Visit | Attending: Internal Medicine | Admitting: Internal Medicine

## 2021-08-21 ENCOUNTER — Telehealth: Payer: Self-pay | Admitting: Internal Medicine

## 2021-08-21 ENCOUNTER — Other Ambulatory Visit: Payer: Self-pay

## 2021-08-21 DIAGNOSIS — R053 Chronic cough: Secondary | ICD-10-CM | POA: Diagnosis not present

## 2021-08-21 IMAGING — CT CT MAXILLOFACIAL W/O CM
3 series · 16 of 47 positions shown, 19 images · non-contrast
Comparison: None.

CLINICAL DATA: Maxillofacial pain

EXAM:
CT MAXILLOFACIAL WITHOUT CONTRAST
TECHNIQUE: Multidetector CT imaging of the maxillofacial structures was
performed. Multiplanar CT image reconstructions were also generated.

[Series 2: max soft · axial · 0.32mm/px · z∈[-195,-43]mm · 10 of 90 slices shown, 13 images]
[im 7/90  brain]
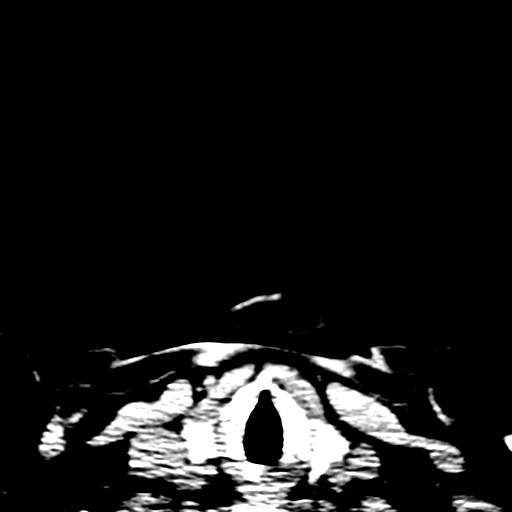
[im 7/90  bone]
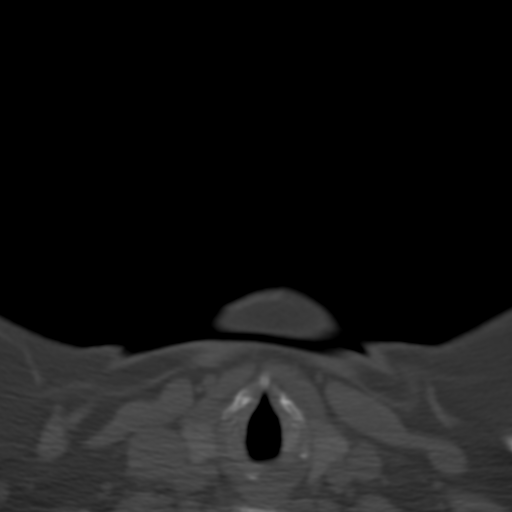
[im 16/90  bone]
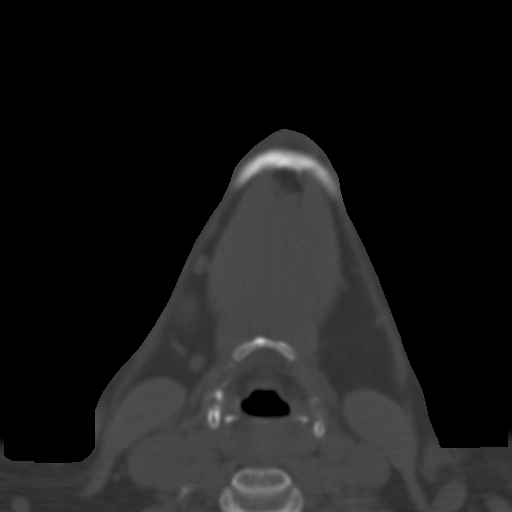
[im 25/90  bone]
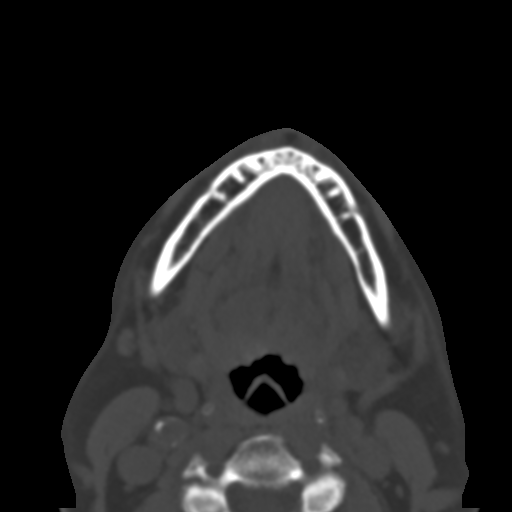
[im 31/90  bone]
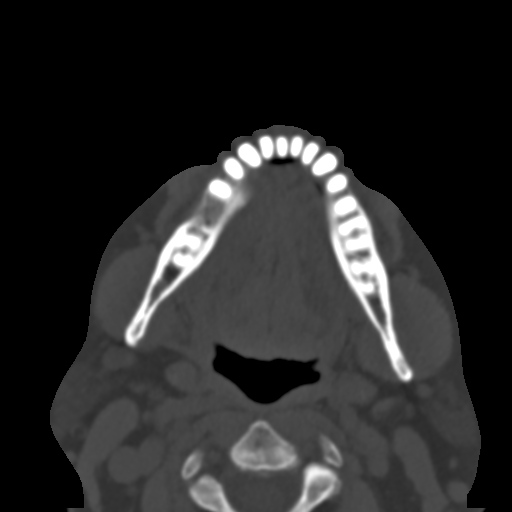
[im 40/90  brain]
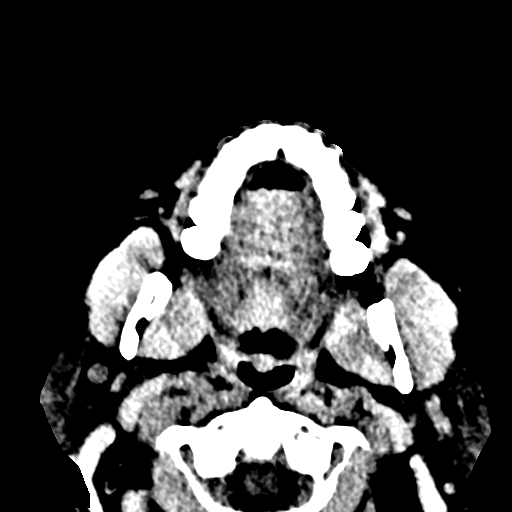
[im 40/90  bone]
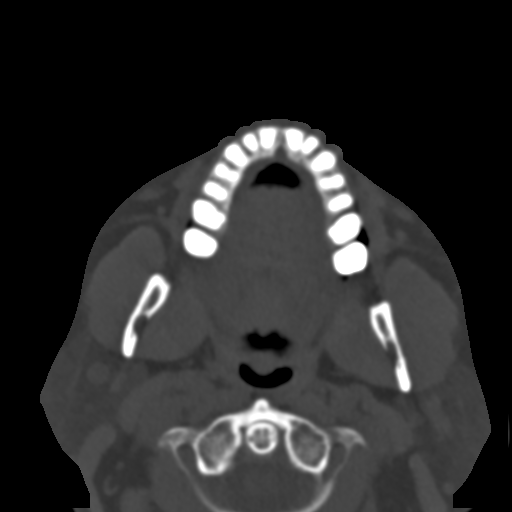
[im 50/90  bone]
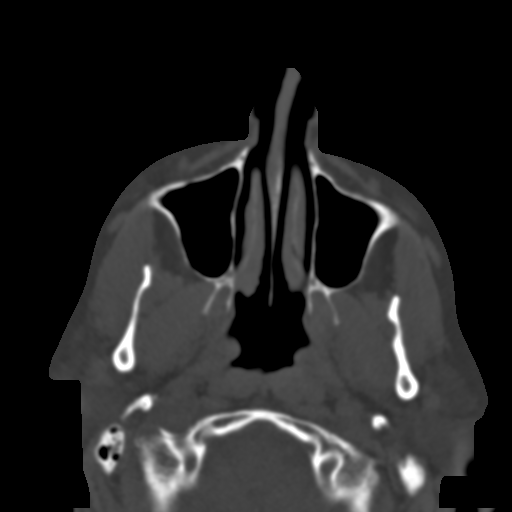
[im 59/90  bone]
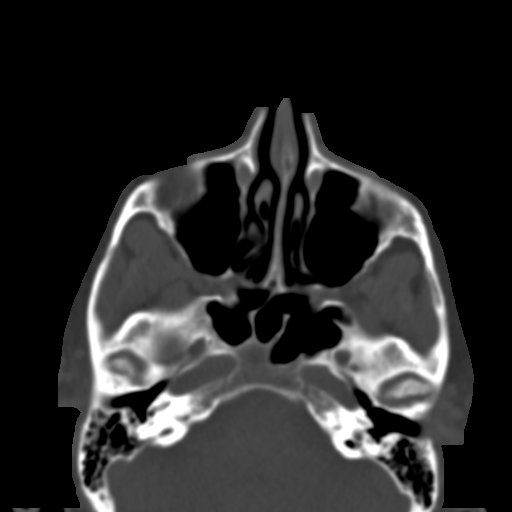
[im 68/90  bone]
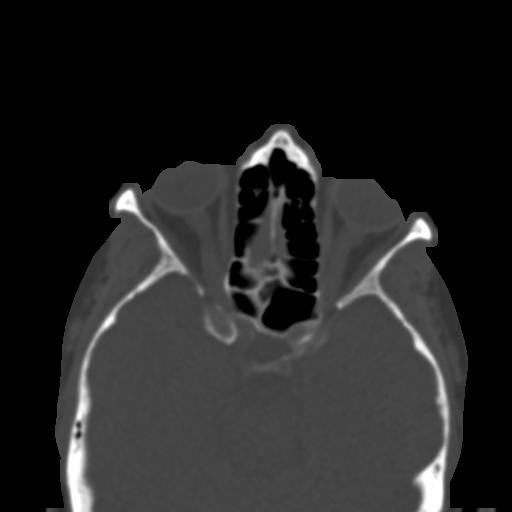
[im 74/90  brain]
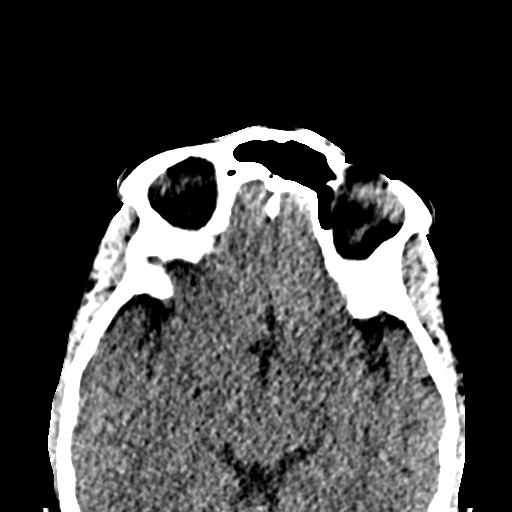
[im 74/90  bone]
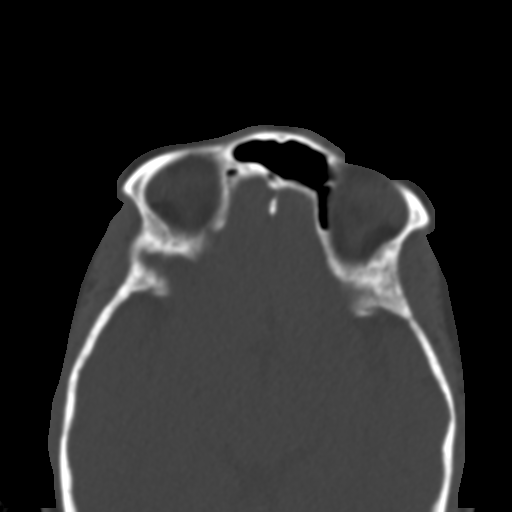
[im 83/90  bone]
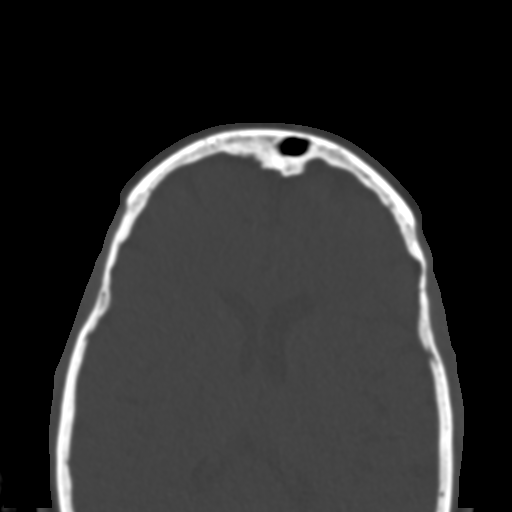

[Series 4: coronal soft · coronal · 0.32mm/px · 3 of 73 slices shown]
[im 25/73  bone]
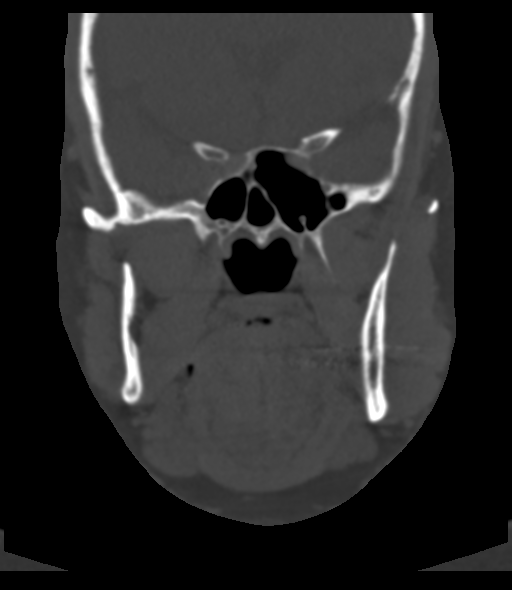
[im 33/73  bone]
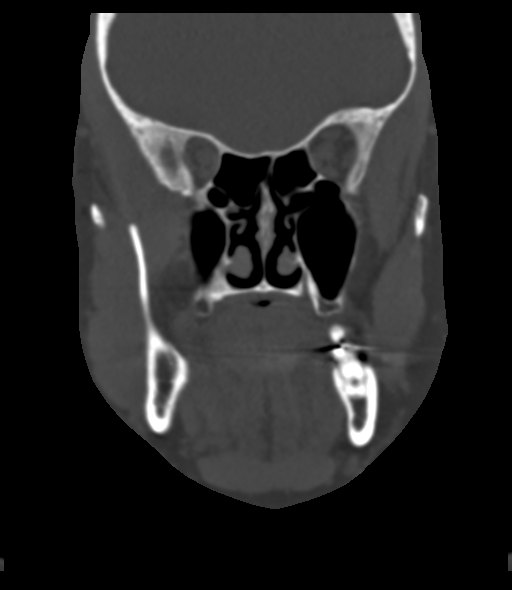
[im 41/73  bone]
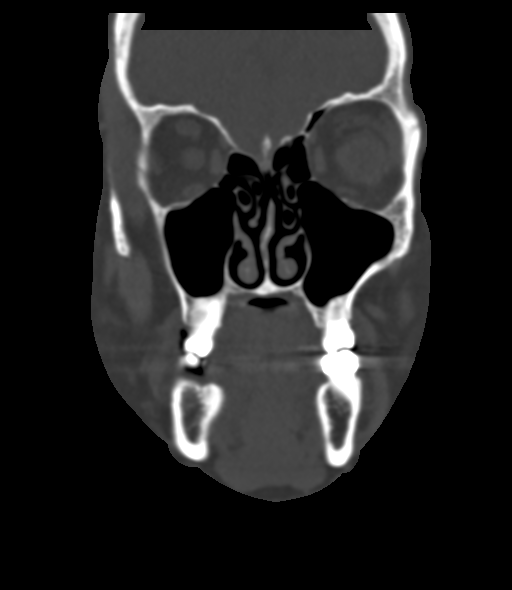

[Series 5: sagittal soft · sagittal · 0.33mm/px · 3 of 82 slices shown]
[im 28/82  bone]
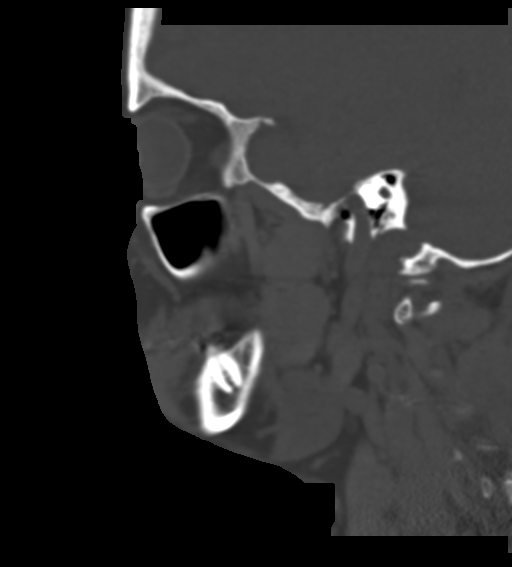
[im 41/82  bone]
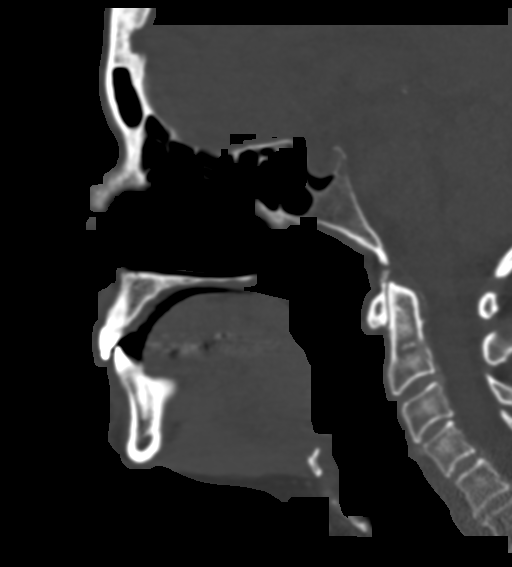
[im 55/82  bone]
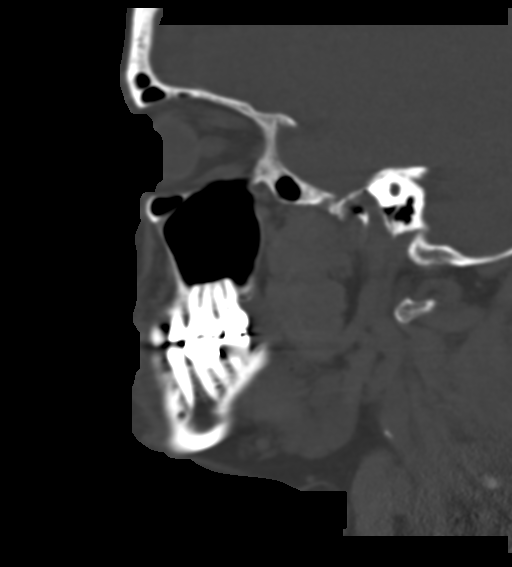

[16 of 47 positions shown; findings below may reference images not displayed]

FINDINGS: Osseous: Unremarkable

Orbits: No focal abnormalities are seen

Sinuses: There are no air-fluid levels or significant mucosal
thickening.

Soft tissues: Unremarkable

Limited intracranial: Unremarkable
IMPRESSION: No significant abnormality is seen. Orbits and paranasal sinuses are
unremarkable.

## 2021-08-21 IMAGING — CT CT CHEST HIGH RESOLUTION
2 of 5 series · 14 of 36 positions shown, 17 images · non-contrast
Comparison: No prior chest CT. CT the abdomen and pelvis
[DATE].

CLINICAL DATA: 55-year-old female with history of dyspnea. Chronic
productive cough. History of asthma.

EXAM:
CT CHEST WITHOUT CONTRAST
TECHNIQUE: Multidetector CT imaging of the chest was performed following the
standard protocol without intravenous contrast. High resolution
imaging of the lungs, as well as inspiratory and expiratory imaging,
was performed.

[Series 4: thorax · axial · 0.73mm/px · z∈[-480,-218]mm · 11 of 145 slices shown, 14 images]
[im 7/145  mediastinal]
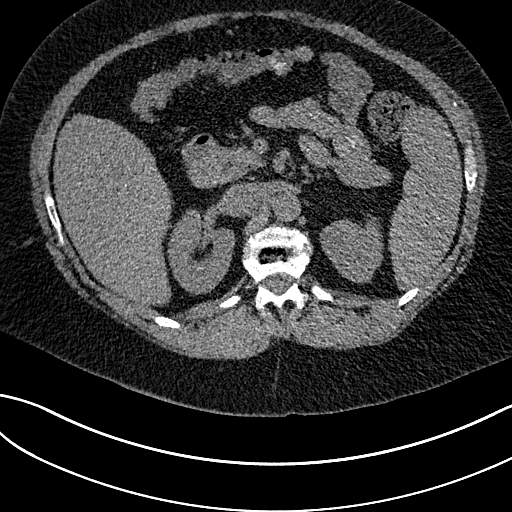
[im 7/145  lung]
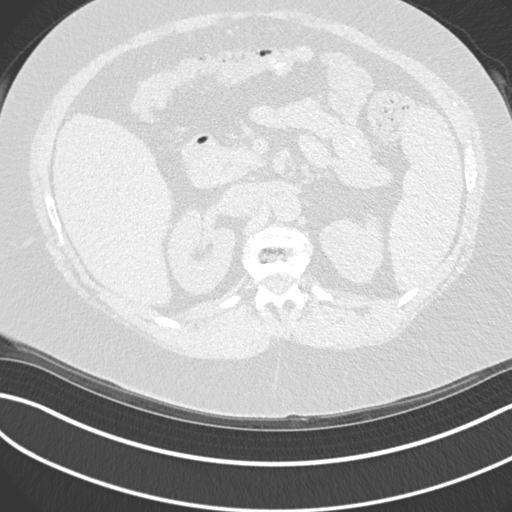
[im 21/145  lung]
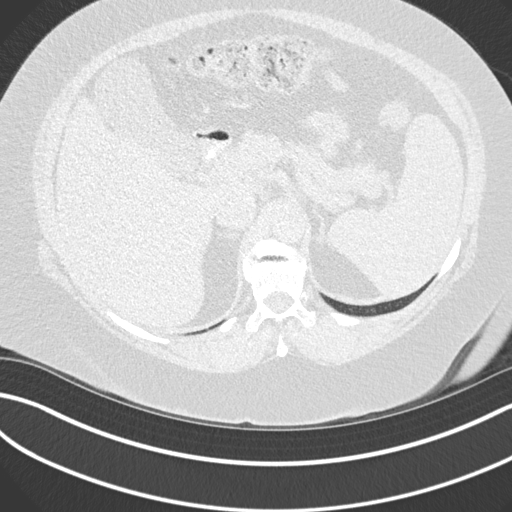
[im 35/145  lung]
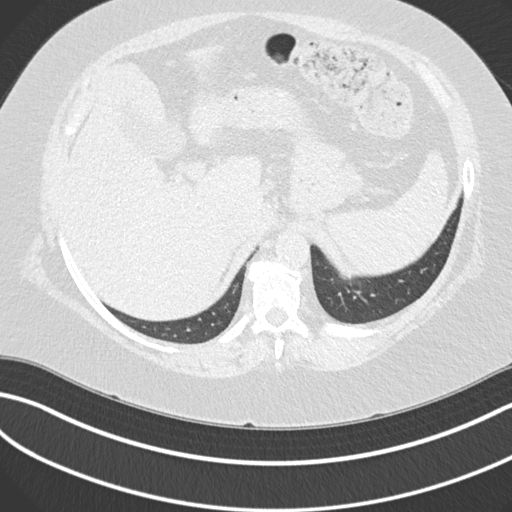
[im 49/145  lung]
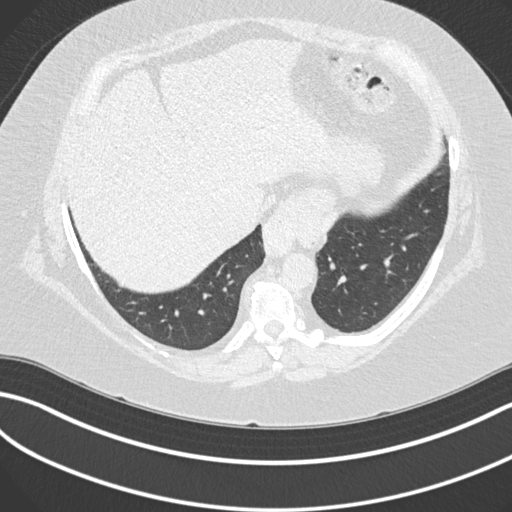
[im 62/145  mediastinal]
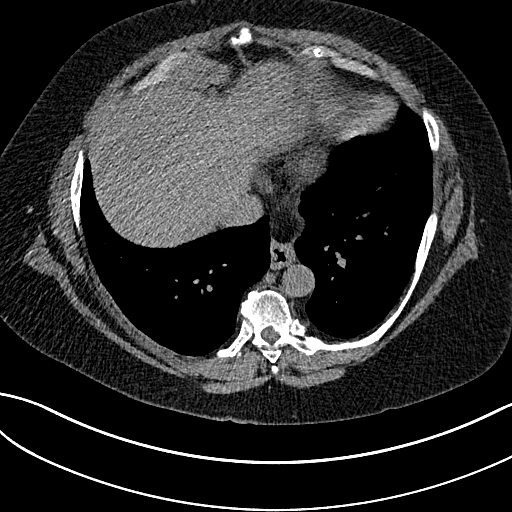
[im 62/145  lung]
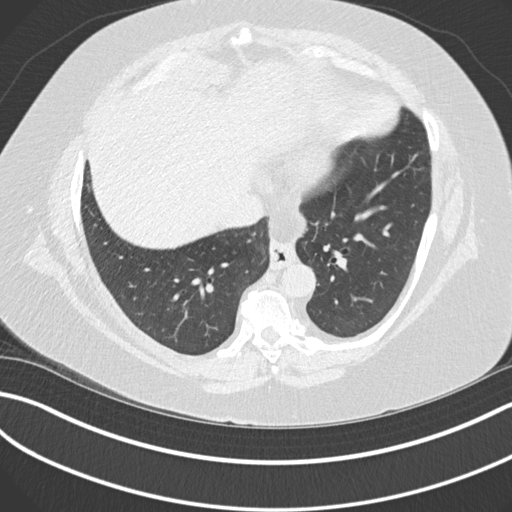
[im 76/145  lung]
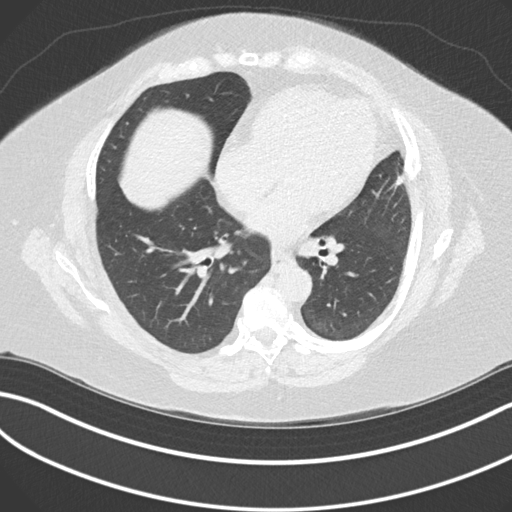
[im 83/145  lung]
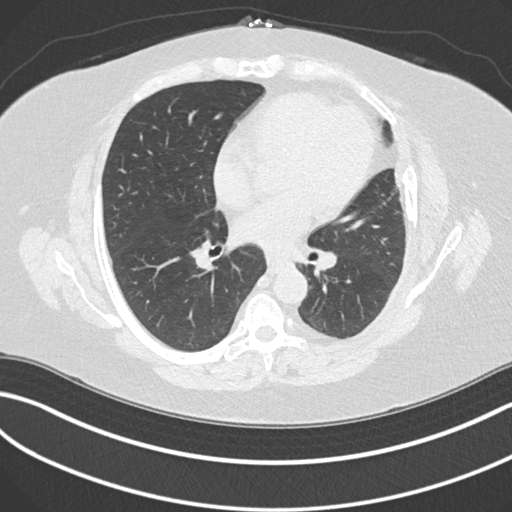
[im 97/145  lung]
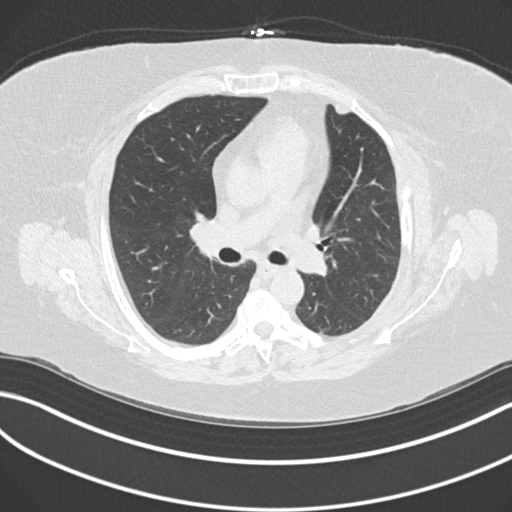
[im 110/145  mediastinal]
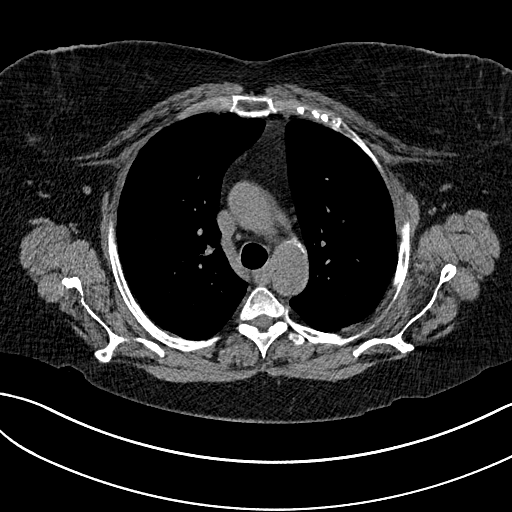
[im 110/145  lung]
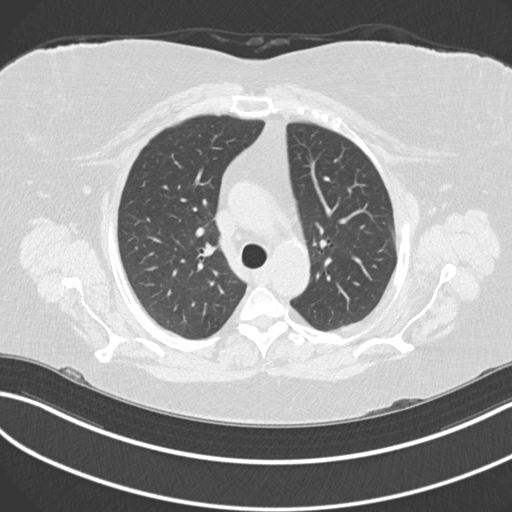
[im 124/145  lung]
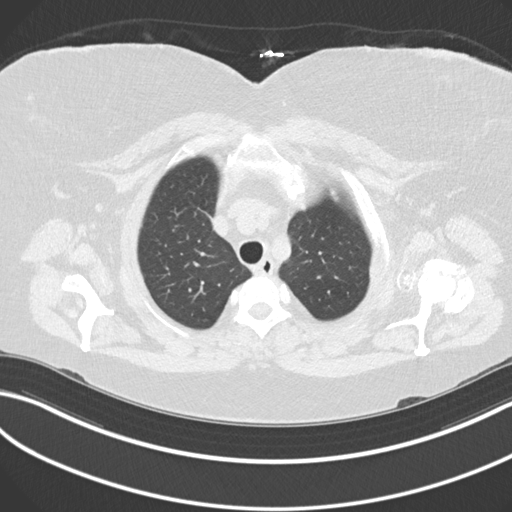
[im 138/145  lung]
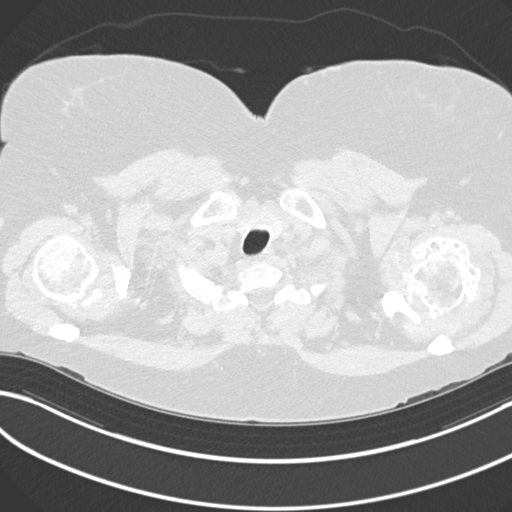

[Series 8: coronal · coronal · 0.59mm/px · 3 of 170 slices shown]
[im 34/170  lung]
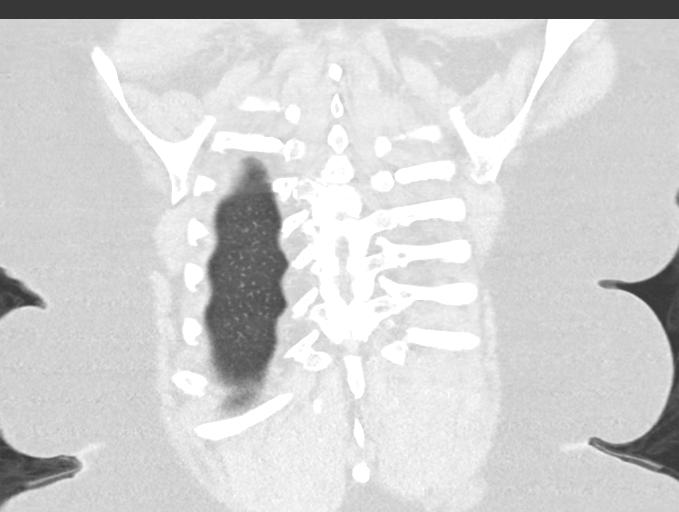
[im 68/170  lung]
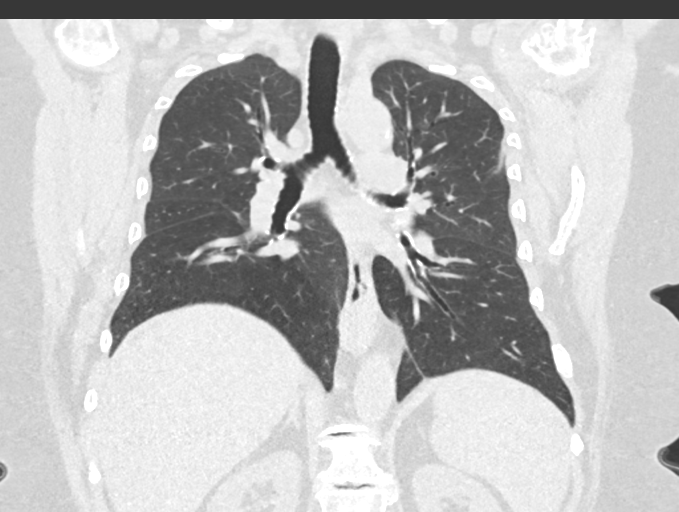
[im 102/170  lung]
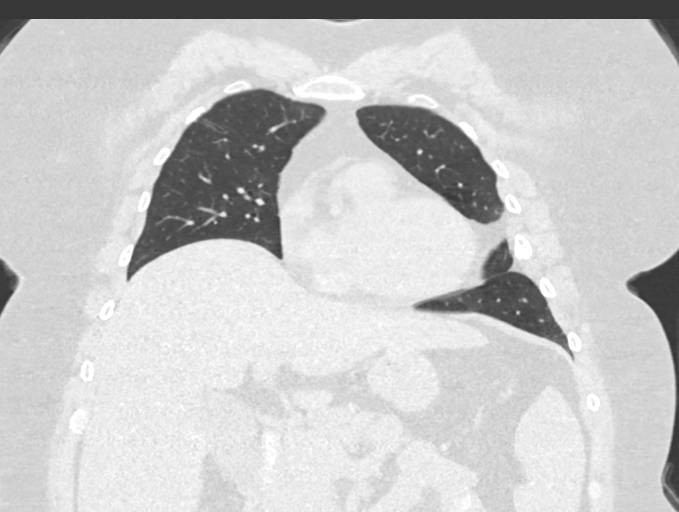

[14 of 36 positions shown; findings below may reference images not displayed]

FINDINGS: Cardiovascular: Heart size is normal. There is no significant
pericardial fluid, thickening or pericardial calcification. Aortic
atherosclerosis. No definite coronary artery calcifications. Mild
calcifications of the aortic valve.

Mediastinum/Nodes: No pathologically enlarged mediastinal or hilar
lymph nodes. Please note that accurate exclusion of hilar adenopathy
is limited on noncontrast CT scans. Small hiatal hernia.

Lungs/Pleura: High-resolution images demonstrate no significant
regions of ground-glass attenuation, septal thickening, subpleural
reticulation, parenchymal banding, traction bronchiectasis or frank
honeycombing to indicate interstitial lung disease. Inspiratory and
expiratory imaging is generally unremarkable, although there is some
collapse of the trachea during expiration. No acute consolidative
airspace disease. No pleural effusions. No suspicious appearing
pulmonary nodules or masses are noted. There is some focal
pleuroparenchymal scarring in the lateral aspect of the inferior
segment of the lingula adjacent to several old healed left-sided rib
fractures. Small calcified and noncalcified left-sided pleural
plaques are noted, likely sequela of remote hemothorax. No definite
right-sided pleural plaques are identified.

Upper Abdomen: Again noted is a low-intermediate attenuation lesion
arising from the lateral aspect of the greater curvature of the
proximal stomach (axial image 105 of series 4, coronal image 92 of
series 8 and sagittal image 141 of series 9) measuring 3.2 x 2.8 x
3.7 cm on today's examination (previously 2.8 x 2.5 x 2.6 cm).

Musculoskeletal: Advanced degenerative changes of osteoarthritis are
noted in the left glenohumeral joint. Multiple old healed left-sided
rib fractures. There are no aggressive appearing lytic or blastic
lesions noted in the visualized portions of the skeleton.
IMPRESSION: 1. No definitive findings to suggest interstitial lung disease.
2. Persistent low-intermediate attenuation mass arising from the
lateral aspect of the greater curvature of the proximal stomach
which has grown slightly compared to the prior examination from
[DATE]. This remains concerning for potential neoplasm. Further
evaluation with endoscopy and endoscopic ultrasound is strongly
recommended if not already performed. Alternatively, further
evaluation with contrast enhanced CT of the abdomen could be
considered.
3. Mild tracheomalacia.
4. Aortic atherosclerosis.
5. There are calcifications of the aortic valve. Echocardiographic
correlation for evaluation of potential valvular dysfunction may be
warranted if clinically indicated.

Aortic Atherosclerosis ([YP]-[YP]).

## 2021-08-21 NOTE — Telephone Encounter (Signed)
Talked with insurance they stated she needed a current xray and to stop lisiniopril. Pt had xray in aug 2022. Dr Simona Huh requested a perr to peer review to get it approved. I called insurance back peer to peer review will be today at 1pm with dr Simona Huh. Pts insurance will back date so pt can go ahead and get the ct and then they can back date once approved.

## 2021-08-21 NOTE — Telephone Encounter (Signed)
Patient is requesting an RX for prednisone or another inhaler please advise

## 2021-08-21 NOTE — Telephone Encounter (Signed)
Dr Simona Huh was able to get the ct of chest approved with the peer to peer review. [id number] is cert # cpt code [id number] good until 02/17/2022

## 2021-08-22 MED ORDER — PREDNISONE 10 MG PO TABS: 40.0000 mg | ORAL_TABLET | Freq: Every day | ORAL | 0 refills | Status: DC

## 2021-08-22 NOTE — Addendum Note (Signed)
Addended by: Felipa Emory on: 08/22/2021 12:33 PM   Modules accepted: Orders

## 2021-08-22 NOTE — Telephone Encounter (Signed)
Yes, please send in prednisone 40 mg daily x 5 days.  We will call her once we have her CT results back.  Thank you!

## 2021-08-22 NOTE — Telephone Encounter (Signed)
Lm for pt to call us back about this need to know why

## 2021-08-22 NOTE — Telephone Encounter (Signed)
Sent in prednisone and informed pt of me doing so

## 2021-08-22 NOTE — Telephone Encounter (Signed)
Pt is having a lot of coughing and was wondering if we could try some prednisone? She is doing all meds, antibiotic, nebulizer, and cough meds

## 2021-08-25 ENCOUNTER — Encounter: Payer: Self-pay | Admitting: Physician Assistant

## 2021-08-25 ENCOUNTER — Other Ambulatory Visit: Payer: Self-pay | Admitting: Internal Medicine

## 2021-08-25 DIAGNOSIS — K3189 Other diseases of stomach and duodenum: Secondary | ICD-10-CM

## 2021-08-25 DIAGNOSIS — J4550 Severe persistent asthma, uncomplicated: Secondary | ICD-10-CM

## 2021-08-25 MED ORDER — BREZTRI AEROSPHERE 160-9-4.8 MCG/ACT IN AERO: 2.0000 | INHALATION_SPRAY | Freq: Two times a day (BID) | RESPIRATORY_TRACT | 1 refills | Status: DC
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
## 2021-08-26 HISTORY — PX: KNEE SURGERY: SHX244

## 2021-09-01 ENCOUNTER — Ambulatory Visit (INDEPENDENT_AMBULATORY_CARE_PROVIDER_SITE_OTHER): Payer: Medicare HMO

## 2021-09-01 DIAGNOSIS — J4550 Severe persistent asthma, uncomplicated: Secondary | ICD-10-CM

## 2021-09-01 DIAGNOSIS — J3089 Other allergic rhinitis: Secondary | ICD-10-CM | POA: Diagnosis not present

## 2021-09-01 NOTE — Progress Notes (Signed)
VIALS MADE. EXP 09-01-22 

## 2021-09-02 DIAGNOSIS — J3081 Allergic rhinitis due to animal (cat) (dog) hair and dander: Secondary | ICD-10-CM | POA: Diagnosis not present
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
## 2021-09-11 ENCOUNTER — Ambulatory Visit: Payer: Medicare HMO | Admitting: Internal Medicine

## 2021-09-11 ENCOUNTER — Encounter: Payer: Self-pay | Admitting: Internal Medicine

## 2021-09-11 ENCOUNTER — Other Ambulatory Visit: Payer: Self-pay

## 2021-09-11 VITALS — BP 104/68 | HR 68 | Temp 97.9°F | Resp 18

## 2021-09-11 DIAGNOSIS — K3189 Other diseases of stomach and duodenum: Secondary | ICD-10-CM

## 2021-09-11 DIAGNOSIS — R053 Chronic cough: Secondary | ICD-10-CM | POA: Diagnosis not present

## 2021-09-11 DIAGNOSIS — J4550 Severe persistent asthma, uncomplicated: Secondary | ICD-10-CM | POA: Diagnosis not present

## 2021-09-11 DIAGNOSIS — B999 Unspecified infectious disease: Secondary | ICD-10-CM

## 2021-09-11 DIAGNOSIS — J309 Allergic rhinitis, unspecified: Secondary | ICD-10-CM | POA: Diagnosis not present

## 2021-09-11 DIAGNOSIS — T7800XD Anaphylactic reaction due to unspecified food, subsequent encounter: Secondary | ICD-10-CM

## 2021-09-11 MED ORDER — SPACER/AERO-HOLDING CHAMBERS DEVI: 1.0000 | 0 refills | Status: DC | PRN

## 2021-09-11 NOTE — Patient Instructions (Addendum)
1. Severe persistent asthma, uncomplicated - Lung function looks restricted today. - Daily controller medication(s): Fasenra every 8 weeks and Breztri two puffs twice daily Stop Azithromycin. - Prior to physical activity: ProAir RespiClick 2 puffs 40-98 minutes before physical activity. - Rescue medications: ProAir 4 puffs every 4-6 hours as needed or albuterol nebulizer one vial puffs every 4-6 hours as needed - Asthma control goals:  * Full participation in all desired activities (may need albuterol before activity) * Albuterol use two time or less a week on average (not counting use with activity) * Cough interfering with sleep two time or less a month * Oral steroids no more than once a year * No hospitalizations  2. Allergic rhinitis - on allergen immunotherapy (prescription altered April 2018) - Continue with allergy shots at the same schedule. - Continue with Flonase two sprays per nostril daily.  - Continue Atrovent (ipatopium) nasal spray 1-2 sprays in each nostril at night AS NEEDED for POST NASAL DRIP/RUNNY NOSE/DRAINAGE.  If you become too dry, use less often.  DO NOT CONTINUE IF HAVING ISSUES URINATING AS THIS CAN MAKE YOUR SYMPTOMS WORSE.  - Continue with the antihistamines as needed.    3. Recurrent infections - needs repeat pneumovax titers (vaccine given 08/04/21).  4. GERD w/ Gastric mass - Continue with Dexilant 60mg  in the morning and Nexium at night.  - follow-up with GI tomorrow as schedule  5. Chronic cough: - sinus CT was normal  - Chest CT showed scarring and mild tracheomalacia likely from previous trauma - continue therapy plan as above - Follow-up with Pulmonary as scheduled; we will appreciate their recommendations  6. Follow-up in 3 months.  Please inform us of any Emergency Department visits, hospitalizations, or changes in symptoms. Call us before going to the ED for breathing or allergy symptoms since we might be able to fit you in for a sick visit.  Feel free to contact us anytime with any questions, problems, or concerns.  It was a pleasure to see you again today!

## 2021-09-12 ENCOUNTER — Encounter: Payer: Self-pay | Admitting: Physician Assistant

## 2021-09-12 ENCOUNTER — Ambulatory Visit: Payer: Medicare HMO | Admitting: Physician Assistant

## 2021-09-12 VITALS — BP 129/77 | HR 75 | Ht 63.0 in | Wt 251.0 lb

## 2021-09-12 DIAGNOSIS — Z860100 Personal history of colon polyps, unspecified: Secondary | ICD-10-CM | POA: Insufficient documentation

## 2021-09-12 DIAGNOSIS — K3189 Other diseases of stomach and duodenum: Secondary | ICD-10-CM | POA: Diagnosis not present

## 2021-09-12 DIAGNOSIS — K219 Gastro-esophageal reflux disease without esophagitis: Secondary | ICD-10-CM | POA: Diagnosis not present

## 2021-09-12 DIAGNOSIS — R935 Abnormal findings on diagnostic imaging of other abdominal regions, including retroperitoneum: Secondary | ICD-10-CM

## 2021-09-12 MED ORDER — PLENVU 140 G PO SOLR: 1.0000 | ORAL | 0 refills | Status: DC
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
## 2021-09-12 NOTE — Patient Instructions (Signed)
If you are age 57 or younger, your body mass index should be between 19-25. Your Body mass index is 44.46 kg/m. If this is out of the aformentioned range listed, please consider follow up with your Primary Care Provider.  ________________________________________________________  The [facility] providers would like to encourage you to use [facility] to communicate with providers for non-urgent requests or questions.  Due to long hold times on the telephone, sending your provider a message by [facility] may be a faster and more efficient way to get a response.  Please allow 48 business hours for a response.  Please remember that this is for non-urgent requests.  _______________________________________________________  Joanna [name] have been scheduled for a colonoscopy. Please follow written instructions given to you at your visit today.  Please pick up your prep supplies at the pharmacy within the next 1-3 days. If you use inhalers (even only as needed), please bring them with you on the day of your procedure.  Follow up pending the results of your Endoscopy/Colonoscopy.  Thank you for entrusting me with your care and choosing [facility].  Amy Esterwood, PA-C
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
## 2021-09-15 ENCOUNTER — Other Ambulatory Visit: Payer: Self-pay

## 2021-09-15 ENCOUNTER — Telehealth: Payer: Self-pay | Admitting: Gastroenterology

## 2021-09-15 ENCOUNTER — Other Ambulatory Visit: Payer: Self-pay | Admitting: Gastroenterology

## 2021-09-15 DIAGNOSIS — K3189 Other diseases of stomach and duodenum: Secondary | ICD-10-CM

## 2021-09-15 MED ORDER — BREZTRI AEROSPHERE 160-9-4.8 MCG/ACT IN AERO: INHALATION_SPRAY | RESPIRATORY_TRACT | 1 refills | Status: DC

## 2021-09-15 NOTE — Telephone Encounter (Signed)
Please see outpatient referral note which has my full attestation.  Patient will benefit from an endoscopic ultrasound with endoscopy as I suspect this is an exophytic subepithelial lesion.  Called and spoke with the patient and she agrees to moving forward with EUS and colonoscopy separately.  I have an opening/availability on Wednesday morning at 730 so we will offer that her that.  She was okay with this plan of action.  I will forward this information to my nursing team so that we can go ahead and get her on the books.  The risks of an EUS including intestinal perforation, bleeding, infection, aspiration, and medication effects were discussed as was the possibility it may not give a definitive diagnosis if a biopsy is performed.  When a biopsy of the pancreas is done as part of the EUS, there is an additional risk of pancreatitis at the rate of about 1-2%.  It was explained that procedure related pancreatitis is typically mild, although it can be severe and even life threatening, which is why we do not perform random pancreatic biopsies and only biopsy a lesion/area we feel is concerning enough to warrant the risk.  The risks and benefits of endoscopic evaluation were discussed with the patient; these include but are not limited to the risk of perforation, infection, bleeding, missed lesions, lack of diagnosis, severe illness requiring hospitalization, as well as anesthesia and sedation related illnesses.  The patient and/or family is agreeable to proceed.   All patient questions were answered to the best of my ability, and the patient agrees to the aforementioned plan of action with follow-up as indicated.   Justice Britain, MD [facility] Advanced Endoscopy Office # [phone]

## 2021-09-15 NOTE — Progress Notes (Signed)
Attending Physician's Attestation   I have reviewed the chart.   I agree with the Advanced Practitioner's note, impression, and recommendations with any updates as below. I have also formally reviewed the imaging.  I am concerned that the lesion that has been seen previously on imaging is actually a subepithelial lesion.  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  With the enlarging size, I think it is worthwhile to have an endoscopic ultrasound available if we do not see anything endoscopically.  We will move forward with scheduling this patient an EGD/EUS.  I called and spoke with the patient on 11/21 and have placed a separate telephone note in the chart.  She understands the potential role of EUS being available and is okay to move forward with a procedure later this week if it works in her schedule.  I have availability on 11/23.  The colonoscopy will be maintained at the current date but only be performed as a colonoscopy.  We will work on opening up the slot to be available for another patient rather than the double procedure slot in the December time period.   Justice Britain, MD [facility] Advanced Endoscopy Office # [phone]

## 2021-09-15 NOTE — Telephone Encounter (Signed)
The pt has been scheduled for EGD EUS at [facility] on 09/17/21 with GM.

## 2021-09-15 NOTE — Telephone Encounter (Signed)
Patient was in on Thursday she forgot to tell Dr. Simona Huh that the Joanna [name] was working well. Sent in 3 Breztri with 1 refill. Sent into [facility]. Marlise Eves to call office if meds are not received.

## 2021-09-15 NOTE — Telephone Encounter (Signed)
I agree with the Advanced Practitioner's note, impression, and recommendations with any updates as below. I have also formally reviewed the imaging.  I am concerned that the lesion that has been seen previously on imaging is actually a subepithelial lesion.  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  With the enlarging size, I think it is worthwhile to have an endoscopic ultrasound available if we do not see anything endoscopically.  We will move forward with scheduling this patient an EGD/EUS.  I called and spoke with the patient on 11/21 and have placed a separate telephone note in the chart.  She understands the potential role of EUS being available and is okay to move forward with a procedure later this week if it works in her schedule.  I have availability on 11/23.  The colonoscopy will be maintained at the current date but only be performed as a colonoscopy.  We will work on opening up the slot to be available for another patient rather than the double procedure slot in the December time period.     Justice Britain, MD [facility] Advanced Endoscopy Office # [phone]

## 2021-09-15 NOTE — Telephone Encounter (Signed)
EUS scheduled, pt instructed and medications reviewed.  Patient to call with any questions or concerns.  I have also changed the appt to just a colon in December.

## 2021-09-16 ENCOUNTER — Ambulatory Visit (INDEPENDENT_AMBULATORY_CARE_PROVIDER_SITE_OTHER): Payer: Medicare HMO

## 2021-09-16 DIAGNOSIS — J309 Allergic rhinitis, unspecified: Secondary | ICD-10-CM | POA: Diagnosis not present

## 2021-09-16 NOTE — Anesthesia Preprocedure Evaluation (Addendum)
Anesthesia Evaluation  Patient identified by MRN, date of birth, ID band Patient awake    Reviewed: Allergy & Precautions, NPO status , Patient's Chart, lab work & pertinent test results  History of Anesthesia Complications Negative for: history of anesthetic complications  Airway Mallampati: III  TM Distance: >3 FB Neck ROM: Full    Dental  (+) Dental Advisory Given, Teeth Intact   Pulmonary asthma ,   Hx tracheostomy in 2003 x ~4 weeks    Pulmonary exam normal        Cardiovascular hypertension, Pt. on home beta blockers and Pt. on medications Normal cardiovascular exam     Neuro/Psych PSYCHIATRIC DISORDERS Depression negative neurological ROS     GI/Hepatic Neg liver ROS, GERD  Medicated and Poorly Controlled, Gastric mass   Endo/Other  diabetes, Type 2, Oral Hypoglycemic AgentsMorbid obesity  Renal/GU negative Renal ROS     Musculoskeletal  (+) Arthritis ,   Abdominal   Peds  Hematology negative hematology ROS (+)   Anesthesia Other Findings   Reproductive/Obstetrics                            Anesthesia Physical Anesthesia Plan  ASA: 3  Anesthesia Plan: General   Post-op Pain Management: Minimal or no pain anticipated   Induction: Rapid sequence and Intravenous  PONV Risk Score and Plan: 3 and Treatment may vary due to age or medical condition, Ondansetron, Dexamethasone and Midazolam  Airway Management Planned: Oral ETT  Additional Equipment: None  Intra-op Plan:   Post-operative Plan: Extubation in OR  Informed Consent: I have reviewed the patients History and Physical, chart, labs and discussed the procedure including the risks, benefits and alternatives for the proposed anesthesia with the patient or authorized representative who has indicated his/her understanding and acceptance.     Dental advisory given  Plan Discussed with: CRNA and  Anesthesiologist  Anesthesia Plan Comments:        Anesthesia Quick Evaluation

## 2021-09-17 ENCOUNTER — Encounter (HOSPITAL_COMMUNITY): Payer: Self-pay | Admitting: Gastroenterology

## 2021-09-17 ENCOUNTER — Ambulatory Visit (HOSPITAL_COMMUNITY): Payer: Medicare HMO | Admitting: Anesthesiology

## 2021-09-17 ENCOUNTER — Encounter (HOSPITAL_COMMUNITY)
Admission: RE | Disposition: A | Discharge status: Home / Self Care | Payer: Self-pay | Source: Home / Self Care | Attending: Gastroenterology

## 2021-09-17 ENCOUNTER — Ambulatory Visit (HOSPITAL_COMMUNITY)
Admission: RE | Admit: 2021-09-17 | Discharge: 2021-09-17 | Disposition: A | Discharge status: Home / Self Care | Payer: Medicare HMO | Attending: Gastroenterology | Admitting: Gastroenterology

## 2021-09-17 DIAGNOSIS — K2970 Gastritis, unspecified, without bleeding: Secondary | ICD-10-CM | POA: Diagnosis not present

## 2021-09-17 DIAGNOSIS — Z86010 Personal history of colonic polyps: Secondary | ICD-10-CM | POA: Insufficient documentation

## 2021-09-17 DIAGNOSIS — K317 Polyp of stomach and duodenum: Secondary | ICD-10-CM | POA: Insufficient documentation

## 2021-09-17 DIAGNOSIS — Z6841 Body Mass Index (BMI) 40.0 and over, adult: Secondary | ICD-10-CM | POA: Diagnosis not present

## 2021-09-17 DIAGNOSIS — J45909 Unspecified asthma, uncomplicated: Secondary | ICD-10-CM | POA: Diagnosis not present

## 2021-09-17 DIAGNOSIS — K449 Diaphragmatic hernia without obstruction or gangrene: Secondary | ICD-10-CM | POA: Insufficient documentation

## 2021-09-17 DIAGNOSIS — E119 Type 2 diabetes mellitus without complications: Secondary | ICD-10-CM | POA: Insufficient documentation

## 2021-09-17 DIAGNOSIS — K319 Disease of stomach and duodenum, unspecified: Secondary | ICD-10-CM | POA: Diagnosis present

## 2021-09-17 DIAGNOSIS — K2289 Other specified disease of esophagus: Secondary | ICD-10-CM | POA: Insufficient documentation

## 2021-09-17 DIAGNOSIS — Q399 Congenital malformation of esophagus, unspecified: Secondary | ICD-10-CM | POA: Diagnosis not present

## 2021-09-17 DIAGNOSIS — Z89511 Acquired absence of right leg below knee: Secondary | ICD-10-CM | POA: Diagnosis not present

## 2021-09-17 DIAGNOSIS — K3189 Other diseases of stomach and duodenum: Secondary | ICD-10-CM

## 2021-09-17 DIAGNOSIS — D8489 Other immunodeficiencies: Secondary | ICD-10-CM | POA: Insufficient documentation

## 2021-09-17 DIAGNOSIS — Z79899 Other long term (current) drug therapy: Secondary | ICD-10-CM | POA: Diagnosis not present

## 2021-09-17 DIAGNOSIS — K219 Gastro-esophageal reflux disease without esophagitis: Secondary | ICD-10-CM | POA: Diagnosis not present

## 2021-09-17 DIAGNOSIS — I10 Essential (primary) hypertension: Secondary | ICD-10-CM | POA: Insufficient documentation

## 2021-09-17 DIAGNOSIS — E669 Obesity, unspecified: Secondary | ICD-10-CM | POA: Diagnosis not present

## 2021-09-17 HISTORY — PX: FINE NEEDLE ASPIRATION: SHX5430

## 2021-09-17 HISTORY — PX: EUS: SHX5427

## 2021-09-17 HISTORY — PX: ESOPHAGOGASTRODUODENOSCOPY (EGD) WITH PROPOFOL: SHX5813

## 2021-09-17 HISTORY — PX: BIOPSY: SHX5522

## 2021-09-17 LAB — GLUCOSE, CAPILLARY: Glucose-Capillary: 113 mg/dL — ABNORMAL HIGH (ref 70–99)

## 2021-09-17 SURGERY — ESOPHAGOGASTRODUODENOSCOPY (EGD) WITH PROPOFOL
Anesthesia: Monitor Anesthesia Care

## 2021-09-17 MED ORDER — LACTATED RINGERS IV SOLN: INTRAVENOUS | Status: DC

## 2021-09-17 MED ORDER — ONDANSETRON HCL 4 MG/2ML IJ SOLN
INTRAMUSCULAR | Status: DC | PRN
  Administered 2021-09-17: 4 mg via INTRAVENOUS

## 2021-09-17 MED ORDER — PROPOFOL 1000 MG/100ML IV EMUL
INTRAVENOUS | Status: AC
  Filled 2021-09-17: qty 100

## 2021-09-17 MED ORDER — PROPOFOL 10 MG/ML IV BOLUS
INTRAVENOUS | Status: DC | PRN
  Administered 2021-09-17: 200 mg via INTRAVENOUS

## 2021-09-17 MED ORDER — PROPOFOL 1000 MG/100ML IV EMUL
INTRAVENOUS | Status: AC
  Filled 2021-09-17: qty 200

## 2021-09-17 MED ORDER — DEXAMETHASONE SODIUM PHOSPHATE 10 MG/ML IJ SOLN
INTRAMUSCULAR | Status: DC | PRN
  Administered 2021-09-17: 5 mg via INTRAVENOUS

## 2021-09-17 MED ORDER — PHENYLEPHRINE 40 MCG/ML (10ML) SYRINGE FOR IV PUSH (FOR BLOOD PRESSURE SUPPORT)
PREFILLED_SYRINGE | INTRAVENOUS | Status: DC | PRN
  Administered 2021-09-17 (×2): 120 ug via INTRAVENOUS
  Administered 2021-09-17: 80 ug via INTRAVENOUS
  Administered 2021-09-17: 120 ug via INTRAVENOUS
  Administered 2021-09-17: 80 ug via INTRAVENOUS
  Administered 2021-09-17: 120 ug via INTRAVENOUS

## 2021-09-17 MED ORDER — PHENYLEPHRINE HCL (PRESSORS) 10 MG/ML IV SOLN
INTRAVENOUS | Status: AC
  Filled 2021-09-17: qty 2

## 2021-09-17 MED ORDER — SODIUM CHLORIDE 0.9 % IV SOLN: INTRAVENOUS | Status: DC

## 2021-09-17 MED ORDER — PROPOFOL 10 MG/ML IV BOLUS
INTRAVENOUS | Status: AC
  Filled 2021-09-17: qty 20

## 2021-09-17 MED ORDER — PHENYLEPHRINE HCL-NACL 20-0.9 MG/250ML-% IV SOLN
INTRAVENOUS | Status: DC | PRN
  Administered 2021-09-17: 40 ug/min via INTRAVENOUS

## 2021-09-17 MED ORDER — EPHEDRINE SULFATE-NACL 50-0.9 MG/10ML-% IV SOSY
PREFILLED_SYRINGE | INTRAVENOUS | Status: DC | PRN
  Administered 2021-09-17: 5 mg via INTRAVENOUS
  Administered 2021-09-17: 10 mg via INTRAVENOUS

## 2021-09-17 MED ORDER — LIDOCAINE 2% (20 MG/ML) 5 ML SYRINGE
INTRAMUSCULAR | Status: DC | PRN
  Administered 2021-09-17: 60 mg via INTRAVENOUS

## 2021-09-17 SURGICAL SUPPLY — 14 items

## 2021-09-17 NOTE — Interval H&P Note (Signed)
History and Physical Interval Note:  09/17/2021 7:28 AM  Joanna [name]  has presented today for surgery, with the diagnosis of gastric mas.  The various methods of treatment have been discussed with the patient and family. After consideration of risks, benefits and other options for treatment, the patient has consented to  Procedure(s): ESOPHAGOGASTRODUODENOSCOPY (EGD) WITH PROPOFOL (N/A) UPPER ENDOSCOPIC ULTRASOUND (EUS) RADIAL (N/A) as a surgical intervention.  The patient's history has been reviewed, patient examined, no change in status, stable for surgery.  I have reviewed the patient's chart and labs.  Questions were answered to the patient's satisfaction.    The risks of an EUS including intestinal perforation, bleeding, infection, aspiration, and medication effects were discussed as was the possibility it may not give a definitive diagnosis if a biopsy is performed.  When a biopsy of the pancreas is done as part of the EUS, there is an additional risk of pancreatitis at the rate of about 1-2%.  It was explained that procedure related pancreatitis is typically mild, although it can be severe and even life threatening, which is why we do not perform random pancreatic biopsies and only biopsy a lesion/area we feel is concerning enough to warrant the risk.    [employer]

## 2021-09-17 NOTE — Discharge Instructions (Signed)
YOU HAD AN ENDOSCOPIC PROCEDURE TODAY: Refer to the procedure report and other information in the discharge instructions given to you for any specific questions about what was found during the examination. If this information does not answer your questions, please call [place] office at [phone] to clarify.   YOU SHOULD EXPECT: Some feelings of bloating in the abdomen. Passage of more gas than usual. Walking can help get rid of the air that was put into your GI tract during the procedure and reduce the bloating. If you had a lower endoscopy (such as a colonoscopy or flexible sigmoidoscopy) you may notice spotting of blood in your stool or on the toilet paper. Some abdominal soreness may be present for a day or two, also.  DIET: Your first meal following the procedure should be a light meal and then it is ok to progress to your normal diet. A half-sandwich or bowl of soup is an example of a good first meal. Heavy or fried foods are harder to digest and may make you feel nauseous or bloated. Drink plenty of fluids but you should avoid alcoholic beverages for 24 hours. If you had a esophageal dilation, please see attached instructions for diet.    ACTIVITY: Your care partner should take you home directly after the procedure. You should plan to take it easy, moving slowly for the rest of the day. You can resume normal activity the day after the procedure however YOU SHOULD NOT DRIVE, use power tools, machinery or perform tasks that involve climbing or major physical exertion for 24 hours (because of the sedation medicines used during the test).   SYMPTOMS TO REPORT IMMEDIATELY: A gastroenterologist can be reached at any hour. Please call [phone]  for any of the following symptoms:   Following upper endoscopy (EGD, EUS, ERCP, esophageal dilation) Vomiting of blood or coffee ground material  New, significant abdominal pain  New, significant chest pain or pain under the shoulder blades  Painful or  persistently difficult swallowing  New shortness of breath  Black, tarry-looking or red, bloody stools  FOLLOW UP:  If any biopsies were taken you will be contacted by phone or by letter within the next 1-3 weeks. Call [phone]  if you have not heard about the biopsies in 3 weeks.  Please also call with any specific questions about appointments or follow up tests.  

## 2021-09-17 NOTE — Transfer of Care (Signed)
Immediate Anesthesia Transfer of Care Note  Patient: Joanna [name]  Procedure(s) Performed: ESOPHAGOGASTRODUODENOSCOPY (EGD) WITH PROPOFOL UPPER ENDOSCOPIC ULTRASOUND (EUS) RADIAL BIOPSY FINE NEEDLE ASPIRATION (FNA) LINEAR  Patient Location: PACU  Anesthesia Type:General  Level of Consciousness: sedated, patient cooperative and responds to stimulation  Airway & Oxygen Therapy: Patient Spontanous Breathing and Patient connected to face mask oxygen  Post-op Assessment: Report given to RN and Post -op Vital signs reviewed and stable  Post vital signs: Reviewed and stable  Last Vitals:  Vitals Value Taken Time  BP 134/72 09/17/21 0851  Temp    Pulse 76 09/17/21 0854  Resp 17 09/17/21 0854  SpO2 100 % 09/17/21 0854  Vitals shown include unvalidated device data.  Last Pain:  Vitals:   09/17/21 0648  TempSrc: Oral  PainSc: 0-No pain         Complications: No notable events documented.

## 2021-09-17 NOTE — Op Note (Signed)
[facility] Patient Name: Joanna [name] Procedure Date: 09/17/2021 MRN: [id number] Attending MD: Justice Britain , MD Date of Birth: [birth year] CSN: [id number] Age: 55 Admit Type: Outpatient Procedure:                Upper EUS Indications:              Suspected mass in stomach on CT scan Providers:                Justice Britain, MD, Dulcy Fanny, Frazier Richards, Technician Referring MD:              Medicines:                General Anesthesia Complications:            No immediate complications. Estimated Blood Loss:     Estimated blood loss was minimal. Procedure:                Pre-Anesthesia Assessment:                           - Prior to the procedure, a History and Physical                            was performed, and patient medications and                            allergies were reviewed. The patient's tolerance of                            previous anesthesia was also reviewed. The risks                            and benefits of the procedure and the sedation                            options and risks were discussed with the patient.                            All questions were answered, and informed consent                            was obtained. Prior Anticoagulants: The patient has                            taken no previous anticoagulant or antiplatelet                            agents. ASA Grade Assessment: III - A patient with                            severe systemic disease. After reviewing the risks  and benefits, the patient was deemed in                            satisfactory condition to undergo the procedure.                           After obtaining informed consent, the endoscope was                            passed under direct vision. Throughout the                            procedure, the patient's blood pressure, pulse, and                            oxygen  saturations were monitored continuously. The                            [id number]-H190 ([id number]) Olympus endoscope was introduced                            through the mouth, and advanced to the second part                            of duodenum. The GF-UE190-AL5 ([id number]) Olympus                            radial ultrasound scope was introduced through the                            mouth, and advanced to the stomach for ultrasound                            examination. The GF-[id number] ([id number]) Olympus linear                            ultrasound scope was introduced through the mouth,                            and advanced to the stomach for ultrasound                            examination. The upper EUS was accomplished without                            difficulty. The patient tolerated the procedure. Scope In: Scope Out: Findings:      ENDOSCOPIC FINDING: :      No gross mucosal lesions were noted in the entire esophagus.      The examined esophagus was significantly tortuous.      The lumen of the esophagus was mildly dilated.      The Z-line was irregular and was found 35 cm from the incisors.      A 5 cm hiatal hernia was present.      A  single 22 mm semi-sessile polyp with no bleeding and stigmata of       recent oozing was found on the lesser curvature of the gastric body -       this appears to be inflammatory in origin.      Scattered moderate inflammation characterized by erosions, erythema,       friability and granularity was found in the entire examined stomach.       Biopsies were taken with a cold forceps for histology and Helicobacter       pylori testing.      No gross lesions were noted in the duodenal bulb, in the first portion       of the duodenum and in the second portion of the duodenum.      ENDOSONOGRAPHIC FINDING: :      A round intramural (subepithelial) lesion was found in the body of the       stomach. It was encountered at 7 cm distal to the  gastroesophageal       junction. The lesion was hypoechoic. Sonographically, the lesion       appeared to originate from the muscularis propria (Layer 4). The lesion       measured 28 mm (in maximum thickness). The lesion also measured 33 mm in       diameter. The outer endosonographic borders were well defined. Fine       needle biopsy was performed. Color Doppler imaging was utilized prior to       needle puncture to confirm a lack of significant vascular structures       within the needle path. Six passes were made with the 22 gauge       ultrasound core biopsy needle using a transgastric approach. A visible       core of tissue was obtained. Preliminary cytologic examination and touch       preps were performed. Final cytology results are pending.      Endosonographic imaging in the visualized portion of the liver showed no       mass.      No malignant-appearing lymph nodes were visualized in the celiac region       (level 20) and perigastric region.      The celiac region was visualized. Impression:               EGD Impression:                           - No gross mucosal lesions in esophagus. Tortuous                            and dilated esophagus.                           - Z-line irregular, 35 cm from the incisors.                           - 5 cm hiatal hernia.                           - A single gastric polyp with stigmata of recent  oozing.                           - Gastritis. Biopsied.                           - No gross lesions in the duodenal bulb, in the                            first portion of the duodenum and in the second                            portion of the duodenum.                           EUS Impression:                           - An intramural (subepithelial) lesion was found in                            the body of the stomach. The lesion appeared to                            originate from within the muscularis  propria (Layer                            4). Tissue was obtained from this exam, and results                            are pending. 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 Fine needle biopsy                            performed.                           - No malignant-appearing lymph nodes were                            visualized in the celiac region (level 20) and                            perigastric region. Moderate Sedation:      Not Applicable - Patient had care per Anesthesia. Recommendation:           - The patient will be observed post-procedure,                            until all discharge criteria are met.                           -  Discharge patient to home.                           - Patient has a contact number available for                            emergencies. The signs and symptoms of potential                            delayed complications were discussed with the                            patient. Return to normal activities tomorrow.                            Written discharge instructions were provided to the                            patient.                           - Resume previous diet.                           - Observe patient's clinical course.                           - Await cytology results and await path results.                           - Maintain current Dexilant dosing.                           - Will plan for patient to have Colonoscopy in                            Hospital-based setting due to her anesthesia risk                            here. If she has evidence of Anemia +/- Iron                            deficiency, I will plan to remove the gastric polyp                            at that time as well.                           - Pending pathology will need a CT-Chest to be                             performed.                           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 If it is not a GIST but                            a Leiomyoma then may not need resection. Time will                            tell.                           - The findings and recommendations were discussed                            with the patient.                           - The findings and recommendations were discussed                            with the patient's family. Procedure Code(s):        --- Professional ---                           [phone], Esophagogastroduodenoscopy, flexible,                            transoral; with transendoscopic ultrasound-guided                            intramural or transmural fine needle                            aspiration/biopsy(s), (includes endoscopic                            ultrasound examination limited to the esophagus,                            stomach or duodenum, and adjacent structures) Diagnosis Code(s):        --- Professional ---                           Q39.9, Congenital malformation of esophagus,                            unspecified                           K22.8, Other specified diseases of esophagus                           K44.9, Diaphragmatic hernia without obstruction or                            gangrene                           K31.7, Polyp of stomach and duodenum  K29.70, Gastritis, unspecified, without bleeding                           K31.89, Other diseases of stomach and duodenum                           I89.9, Noninfective disorder of lymphatic vessels                            and lymph nodes, unspecified                           R93.3, Abnormal findings on diagnostic imaging of                            other parts of digestive tract CPT copyright 2019 [employer]. All rights reserved. The codes documented in this  report are preliminary and upon coder review may  be revised to meet current compliance requirements. Justice Britain, MD 09/17/2021 8:57:02 AM Number of Addenda: 0

## 2021-09-17 NOTE — Anesthesia Postprocedure Evaluation (Signed)
Anesthesia Post Note  Patient: Joanna [name]  Procedure(s) Performed: ESOPHAGOGASTRODUODENOSCOPY (EGD) WITH PROPOFOL UPPER ENDOSCOPIC ULTRASOUND (EUS) RADIAL BIOPSY FINE NEEDLE ASPIRATION (FNA) LINEAR     Patient location during evaluation: PACU Anesthesia Type: MAC Level of consciousness: awake and alert Pain management: pain level controlled Vital Signs Assessment: post-procedure vital signs reviewed and stable Respiratory status: spontaneous breathing, nonlabored ventilation and respiratory function stable Cardiovascular status: stable and blood pressure returned to baseline Anesthetic complications: no   No notable events documented.  Last Vitals:  Vitals:   09/17/21 0900 09/17/21 0910  BP: 126/69 127/84  Pulse: 71 71  Resp: 11 18  Temp:    SpO2: 99% 94%    Last Pain:  Vitals:   09/17/21 0910  TempSrc:   PainSc: 0-No pain                 Audry Pili

## 2021-09-17 NOTE — Anesthesia Procedure Notes (Signed)
Procedure Name: Intubation Date/Time: 09/17/2021 7:51 AM Performed by: Gean Maidens, CRNA Pre-anesthesia Checklist: Patient identified, Emergency Drugs available, Suction available, Patient being monitored and Timeout performed Patient Re-evaluated:Patient Re-evaluated prior to induction Oxygen Delivery Method: Circle system utilized Preoxygenation: Pre-oxygenation with 100% oxygen Induction Type: IV induction and Rapid sequence Laryngoscope Size: Mac and 4 Grade View: Grade I Tube type: Oral Tube size: 7.0 mm Number of attempts: 1 Airway Equipment and Method: Stylet Placement Confirmation: ETT inserted through vocal cords under direct vision, positive ETCO2 and breath sounds checked- equal and bilateral Secured at: 21 cm Tube secured with: Tape Dental Injury: Teeth and Oropharynx as per pre-operative assessment

## 2021-09-19 LAB — SURGICAL PATHOLOGY

## 2021-09-21 ENCOUNTER — Encounter (HOSPITAL_COMMUNITY): Payer: Self-pay | Admitting: Gastroenterology

## 2021-09-22 ENCOUNTER — Telehealth: Payer: Self-pay

## 2021-09-22 ENCOUNTER — Encounter: Payer: Self-pay | Admitting: Gastroenterology

## 2021-09-22 DIAGNOSIS — K3189 Other diseases of stomach and duodenum: Secondary | ICD-10-CM

## 2021-09-22 NOTE — Telephone Encounter (Signed)
-----   Message from Irving Copas., MD sent at 09/18/2021  6:43 AM EST ----- Regarding: Follow-up with this patient Joanna [name], After speaking with anesthesia in the hospital, this patient is not a candidate for any procedures in the [place]. Her currently scheduled procedures in the [facility] in December should be canceled.  She needs to be rescheduled for these procedures in January/February at the hospital. She also needs to come in approximately 3 to 4 weeks to have a CBC/iron/TIBC/ferritin performed. If the patient has evidence of iron deficiency we will need to proceed with repeat endoscopy and gastric polyp resection at the time of the colonoscopy so for now just go ahead and schedule it as an EGD/colonoscopy but if her iron studies are fine we will just perform colonoscopy. Thanks. GM

## 2021-09-23 ENCOUNTER — Ambulatory Visit (INDEPENDENT_AMBULATORY_CARE_PROVIDER_SITE_OTHER): Payer: Medicare HMO

## 2021-09-23 DIAGNOSIS — J309 Allergic rhinitis, unspecified: Secondary | ICD-10-CM | POA: Diagnosis not present

## 2021-09-23 NOTE — Telephone Encounter (Signed)
Left message on machine to call back  

## 2021-09-24 ENCOUNTER — Other Ambulatory Visit: Payer: Self-pay

## 2021-09-24 ENCOUNTER — Telehealth: Payer: Self-pay | Admitting: Gastroenterology

## 2021-09-24 DIAGNOSIS — K3189 Other diseases of stomach and duodenum: Secondary | ICD-10-CM

## 2021-09-24 MED ORDER — PEG 3350-KCL-NA BICARB-NACL 420 G PO SOLR: 4000.0000 mL | Freq: Once | ORAL | 0 refills | Status: AC

## 2021-09-24 NOTE — Telephone Encounter (Signed)
Mansouraty, Telford Nab., MD  Timothy Lasso, RN Kamerin Grumbine,  Please let the patient know that I have reviewed the pathology tissue samples and there is no evidence of H. pylori on the stomach lining biopsies.  The cytology is still pending final read and I reviewed that with pathology they hope to have results by Monday next week.  If she has not heard from [place] by next Monday, she should reach out to [place] again.  I will then find out what is going on from a pathology standpoint again.  Thanks.  GM

## 2021-09-24 NOTE — Telephone Encounter (Signed)
See alternate note  

## 2021-09-24 NOTE — Telephone Encounter (Addendum)
Patient is returning your call again, did not see any result notes from 09/23/21 in Epic. Patient is requesting a call back today. Thanks

## 2021-09-24 NOTE — Telephone Encounter (Signed)
Endo colon scheduled for 12/01/21 at [facility] with GM Instructions mailed to the pt  Labs entered to be completed in 3-4 weeks.   Left message on machine to call back

## 2021-09-24 NOTE — Telephone Encounter (Signed)
Patient returned your call.  Please call her back.  Thank you. 

## 2021-09-24 NOTE — Telephone Encounter (Signed)
See alternate results note 11/29

## 2021-09-25 ENCOUNTER — Telehealth: Payer: Self-pay | Admitting: Gastroenterology

## 2021-09-25 NOTE — Telephone Encounter (Signed)
See results note dated 11/28 

## 2021-09-25 NOTE — Telephone Encounter (Signed)
Colon scheduled, pt instructed and medications reviewed.  Patient instructions mailed to home.  Patient to call with any questions or concerns.   The pt has been advised to come in for labs in 3-4 weeks.  Orders have been entered

## 2021-09-25 NOTE — Telephone Encounter (Signed)
Patient returning your call from yesterday about her results. Please advise.

## 2021-09-29 ENCOUNTER — Ambulatory Visit (INDEPENDENT_AMBULATORY_CARE_PROVIDER_SITE_OTHER): Payer: Medicare HMO

## 2021-09-29 DIAGNOSIS — J309 Allergic rhinitis, unspecified: Secondary | ICD-10-CM | POA: Diagnosis not present

## 2021-09-29 LAB — CYTOLOGY - NON PAP

## 2021-09-30 ENCOUNTER — Telehealth: Payer: Self-pay | Admitting: Gastroenterology

## 2021-09-30 ENCOUNTER — Telehealth: Payer: Self-pay

## 2021-09-30 NOTE — Telephone Encounter (Signed)
Pt provided address to [facility]. No further questions.

## 2021-09-30 NOTE — Telephone Encounter (Signed)
Dr Rush Landmark does the pt need to have the Colon Endo in the hospital on 2/6 as well as the EUS on 1/5?

## 2021-09-30 NOTE — Telephone Encounter (Signed)
Change that to just a Colonoscopy in February. Thanks. GM

## 2021-09-30 NOTE — Telephone Encounter (Signed)
Needs the address for her procedure on 10/30/21.  Please call.  Thank you.

## 2021-09-30 NOTE — Telephone Encounter (Signed)
I have had the EGD removed from the Feb case.

## 2021-10-07 ENCOUNTER — Encounter: Payer: Medicare HMO | Admitting: Gastroenterology

## 2021-10-07 NOTE — Telephone Encounter (Signed)
Inbound call from patient. Have 2 envelopes and had questions about the correct appts.

## 2021-10-07 NOTE — Telephone Encounter (Signed)
The pt has been advised that she will have EUS on 1/5 and colon on 2/6.  She had no further questions at this time

## 2021-10-08 ENCOUNTER — Ambulatory Visit (INDEPENDENT_AMBULATORY_CARE_PROVIDER_SITE_OTHER): Payer: Medicare HMO

## 2021-10-08 DIAGNOSIS — J309 Allergic rhinitis, unspecified: Secondary | ICD-10-CM

## 2021-10-10 ENCOUNTER — Other Ambulatory Visit: Payer: Self-pay | Admitting: Surgery

## 2021-10-10 DIAGNOSIS — R1900 Intra-abdominal and pelvic swelling, mass and lump, unspecified site: Secondary | ICD-10-CM

## 2021-10-10 DIAGNOSIS — K3189 Other diseases of stomach and duodenum: Secondary | ICD-10-CM

## 2021-10-16 ENCOUNTER — Encounter (HOSPITAL_COMMUNITY): Payer: Self-pay | Admitting: Gastroenterology

## 2021-10-17 NOTE — Progress Notes (Signed)
Attempted to obtain medical history via telephone, unable to reach at this time. I left a voicemail to return pre surgical testing department's phone call.  

## 2021-10-21 ENCOUNTER — Telehealth: Payer: Self-pay | Admitting: Gastroenterology

## 2021-10-21 NOTE — Telephone Encounter (Signed)
The pt has been advised that she is due for labs.  She can come at her convenience the orders have been entered.  She will come in this week.

## 2021-10-21 NOTE — Telephone Encounter (Signed)
Inbound call from patient asking if she is to have blood work before her EGD 10/30/2021

## 2021-10-22 ENCOUNTER — Other Ambulatory Visit (INDEPENDENT_AMBULATORY_CARE_PROVIDER_SITE_OTHER): Payer: Medicare HMO

## 2021-10-22 DIAGNOSIS — K3189 Other diseases of stomach and duodenum: Secondary | ICD-10-CM

## 2021-10-22 LAB — CBC WITH DIFFERENTIAL/PLATELET
Basophils Absolute: 0 10*3/uL (ref 0.0–0.1)
Basophils Relative: 0.4 % (ref 0.0–3.0)
Eosinophils Absolute: 0 10*3/uL (ref 0.0–0.7)
Eosinophils Relative: 0 % (ref 0.0–5.0)
HCT: 39.6 % (ref 36.0–46.0)
Hemoglobin: 12.9 g/dL (ref 12.0–15.0)
Lymphocytes Relative: 10.1 % — ABNORMAL LOW (ref 12.0–46.0)
Lymphs Abs: 1 10*3/uL (ref 0.7–4.0)
MCHC: 32.5 g/dL (ref 30.0–36.0)
MCV: 91 fl (ref 78.0–100.0)
Monocytes Absolute: 0.5 10*3/uL (ref 0.1–1.0)
Monocytes Relative: 5.1 % (ref 3.0–12.0)
Neutro Abs: 8.8 10*3/uL — ABNORMAL HIGH (ref 1.4–7.7)
Neutrophils Relative %: 84.4 % — ABNORMAL HIGH (ref 43.0–77.0)
Platelets: 232 10*3/uL (ref 150.0–400.0)
RBC: 4.36 Mil/uL (ref 3.87–5.11)
RDW: 13.8 % (ref 11.5–15.5)
WBC: 10.4 10*3/uL (ref 4.0–10.5)

## 2021-10-22 LAB — IBC + FERRITIN
Ferritin: 12.8 ng/mL (ref 10.0–291.0)
Iron: 63 ug/dL (ref 42–145)
Saturation Ratios: 12.3 % — ABNORMAL LOW (ref 20.0–50.0)
TIBC: 512.4 ug/dL — ABNORMAL HIGH (ref 250.0–450.0)
Transferrin: 366 mg/dL — ABNORMAL HIGH (ref 212.0–360.0)

## 2021-10-23 ENCOUNTER — Other Ambulatory Visit: Payer: Self-pay

## 2021-10-23 DIAGNOSIS — D508 Other iron deficiency anemias: Secondary | ICD-10-CM

## 2021-10-23 DIAGNOSIS — K3189 Other diseases of stomach and duodenum: Secondary | ICD-10-CM

## 2021-10-24 ENCOUNTER — Other Ambulatory Visit: Payer: Self-pay

## 2021-10-24 ENCOUNTER — Telehealth: Payer: Self-pay | Admitting: Gastroenterology

## 2021-10-24 MED ORDER — FERROUS SULFATE 325 (65 FE) MG PO TBEC: 325.0000 mg | DELAYED_RELEASE_TABLET | Freq: Every day | ORAL | 0 refills | Status: DC

## 2021-10-24 NOTE — Telephone Encounter (Signed)
Patient returning your call from yesterday, please advise.

## 2021-10-24 NOTE — Telephone Encounter (Signed)
Spoke with pt, see 12/28 lab result note for details.

## 2021-10-28 ENCOUNTER — Other Ambulatory Visit: Payer: Self-pay

## 2021-10-28 ENCOUNTER — Ambulatory Visit (INDEPENDENT_AMBULATORY_CARE_PROVIDER_SITE_OTHER): Payer: Medicare HMO

## 2021-10-28 DIAGNOSIS — J4550 Severe persistent asthma, uncomplicated: Secondary | ICD-10-CM | POA: Diagnosis not present

## 2021-10-29 ENCOUNTER — Encounter (HOSPITAL_COMMUNITY): Payer: Self-pay | Admitting: Gastroenterology

## 2021-10-29 NOTE — Progress Notes (Signed)
DUE TO COVID-19 ONLY ONE VISITOR IS ALLOWED TO COME WITH YOU AND STAY IN THE WAITING ROOM ONLY DURING PRE OP AND PROCEDURE DAY OF SURGERY.   PCP - Dr Ward Givens Cardiologist - n/a Allergy and Asthma - Dr Sigurd Sos  CT Chest x-ray - 08/21/21 EKG - n/a Stress Test - n/a ECHO - n/a Cardiac Cath - n/a  ICD Pacemaker/Loop - n/a  Sleep Study -  n/a CPAP - none  Do not take semaglutide on DOS.  If your blood sugar is less than 70 mg/dL, you will need to treat for low blood sugar: Treat a low blood sugar (less than 70 mg/dL) with  cup of clear juice (cranberry or apple), 4 glucose tablets, OR glucose gel. Recheck blood sugar in 15 minutes after treatment (to make sure it is greater than 70 mg/dL). If your blood sugar is not greater than 70 mg/dL on recheck, call [phone] for further instructions.  Anesthesia review: Yes  STOP now taking any Aspirin (unless otherwise instructed by your surgeon), Aleve, Naproxen, Ibuprofen, Motrin, Advil, Goody's, BC's, all herbal medications, fish oil, and all vitamins.   Coronavirus Screening Covid test is scheduled on DOS. Do you have any of the following symptoms:  Cough yes/no: No Fever (>100.38F)  yes/no: No Runny nose yes/no: No Sore throat yes/no: No Difficulty breathing/shortness of breath  yes/no: No  Have you traveled in the last 14 days and where? yes/no: No  Patient verbalized understanding of instructions that were given via phone.

## 2021-10-30 ENCOUNTER — Ambulatory Visit (HOSPITAL_COMMUNITY)
Admission: RE | Admit: 2021-10-30 | Discharge: 2021-10-30 | Disposition: A | Discharge status: Home / Self Care | Payer: Medicare HMO | Attending: Gastroenterology | Admitting: Gastroenterology

## 2021-10-30 ENCOUNTER — Other Ambulatory Visit: Payer: Self-pay

## 2021-10-30 ENCOUNTER — Ambulatory Visit (HOSPITAL_COMMUNITY): Payer: Medicare HMO | Admitting: Physician Assistant

## 2021-10-30 ENCOUNTER — Encounter (HOSPITAL_COMMUNITY): Payer: Self-pay | Admitting: Gastroenterology

## 2021-10-30 ENCOUNTER — Encounter (HOSPITAL_COMMUNITY)
Admission: RE | Disposition: A | Discharge status: Home / Self Care | Payer: Self-pay | Source: Home / Self Care | Attending: Gastroenterology

## 2021-10-30 DIAGNOSIS — K208 Other esophagitis without bleeding: Secondary | ICD-10-CM | POA: Diagnosis not present

## 2021-10-30 DIAGNOSIS — E119 Type 2 diabetes mellitus without complications: Secondary | ICD-10-CM | POA: Diagnosis not present

## 2021-10-30 DIAGNOSIS — G709 Myoneural disorder, unspecified: Secondary | ICD-10-CM | POA: Diagnosis not present

## 2021-10-30 DIAGNOSIS — K3189 Other diseases of stomach and duodenum: Secondary | ICD-10-CM | POA: Diagnosis not present

## 2021-10-30 DIAGNOSIS — I10 Essential (primary) hypertension: Secondary | ICD-10-CM | POA: Insufficient documentation

## 2021-10-30 DIAGNOSIS — K929 Disease of digestive system, unspecified: Secondary | ICD-10-CM | POA: Diagnosis not present

## 2021-10-30 DIAGNOSIS — F32A Depression, unspecified: Secondary | ICD-10-CM | POA: Diagnosis not present

## 2021-10-30 DIAGNOSIS — M1990 Unspecified osteoarthritis, unspecified site: Secondary | ICD-10-CM | POA: Insufficient documentation

## 2021-10-30 DIAGNOSIS — K449 Diaphragmatic hernia without obstruction or gangrene: Secondary | ICD-10-CM | POA: Diagnosis not present

## 2021-10-30 DIAGNOSIS — K2970 Gastritis, unspecified, without bleeding: Secondary | ICD-10-CM | POA: Insufficient documentation

## 2021-10-30 DIAGNOSIS — R933 Abnormal findings on diagnostic imaging of other parts of digestive tract: Secondary | ICD-10-CM | POA: Insufficient documentation

## 2021-10-30 DIAGNOSIS — D509 Iron deficiency anemia, unspecified: Secondary | ICD-10-CM | POA: Insufficient documentation

## 2021-10-30 DIAGNOSIS — K2289 Other specified disease of esophagus: Secondary | ICD-10-CM | POA: Insufficient documentation

## 2021-10-30 DIAGNOSIS — Z8500 Personal history of malignant neoplasm of unspecified digestive organ: Secondary | ICD-10-CM | POA: Insufficient documentation

## 2021-10-30 DIAGNOSIS — K317 Polyp of stomach and duodenum: Secondary | ICD-10-CM

## 2021-10-30 DIAGNOSIS — J45909 Unspecified asthma, uncomplicated: Secondary | ICD-10-CM | POA: Insufficient documentation

## 2021-10-30 HISTORY — DX: Myoneural disorder, unspecified: G70.9

## 2021-10-30 HISTORY — DX: Hyperlipidemia, unspecified: E78.5

## 2021-10-30 HISTORY — PX: EUS: SHX5427

## 2021-10-30 HISTORY — DX: Other specified postprocedural states: R11.2

## 2021-10-30 HISTORY — PX: HEMOSTASIS CLIP PLACEMENT: SHX6857

## 2021-10-30 HISTORY — PX: FINE NEEDLE ASPIRATION: SHX5430

## 2021-10-30 HISTORY — PX: SCLEROTHERAPY: SHX6841

## 2021-10-30 HISTORY — DX: Other specified postprocedural states: Z98.890

## 2021-10-30 HISTORY — PX: BIOPSY: SHX5522

## 2021-10-30 HISTORY — PX: POLYPECTOMY: SHX5525

## 2021-10-30 HISTORY — DX: Failed or difficult intubation, initial encounter: T88.4XXA

## 2021-10-30 HISTORY — DX: Fatty (change of) liver, not elsewhere classified: K76.0

## 2021-10-30 HISTORY — DX: Personal history of other medical treatment: Z92.89

## 2021-10-30 HISTORY — PX: ESOPHAGOGASTRODUODENOSCOPY (EGD) WITH PROPOFOL: SHX5813

## 2021-10-30 HISTORY — DX: Pneumonia, unspecified organism: J18.9

## 2021-10-30 LAB — GLUCOSE, CAPILLARY
Glucose-Capillary: 100 mg/dL — ABNORMAL HIGH (ref 70–99)
Glucose-Capillary: 117 mg/dL — ABNORMAL HIGH (ref 70–99)

## 2021-10-30 SURGERY — ESOPHAGOGASTRODUODENOSCOPY (EGD) WITH PROPOFOL
Anesthesia: General

## 2021-10-30 MED ORDER — DEXLANSOPRAZOLE 60 MG PO CPDR: 1.0000 | DELAYED_RELEASE_CAPSULE | Freq: Every day | ORAL | 0 refills | Status: DC

## 2021-10-30 MED ORDER — ONDANSETRON HCL 4 MG/2ML IJ SOLN: 4.0000 mg | Freq: Four times a day (QID) | INTRAMUSCULAR | Status: DC | PRN

## 2021-10-30 MED ORDER — PHENYLEPHRINE 40 MCG/ML (10ML) SYRINGE FOR IV PUSH (FOR BLOOD PRESSURE SUPPORT)
PREFILLED_SYRINGE | INTRAVENOUS | Status: DC | PRN
  Administered 2021-10-30: 80 ug via INTRAVENOUS

## 2021-10-30 MED ORDER — FENTANYL CITRATE (PF) 250 MCG/5ML IJ SOLN
INTRAMUSCULAR | Status: DC | PRN
Start: 2021-10-30 — End: 2021-10-30
  Administered 2021-10-30 (×2): 50 ug via INTRAVENOUS

## 2021-10-30 MED ORDER — LIDOCAINE 2% (20 MG/ML) 5 ML SYRINGE
INTRAMUSCULAR | Status: DC | PRN
  Administered 2021-10-30: 60 mg via INTRAVENOUS

## 2021-10-30 MED ORDER — SUGAMMADEX SODIUM 200 MG/2ML IV SOLN
INTRAVENOUS | Status: DC | PRN
  Administered 2021-10-30: 200 mg via INTRAVENOUS

## 2021-10-30 MED ORDER — MIDAZOLAM HCL 2 MG/2ML IJ SOLN
INTRAMUSCULAR | Status: DC | PRN
  Administered 2021-10-30: 1 mg via INTRAVENOUS

## 2021-10-30 MED ORDER — PROPOFOL 500 MG/50ML IV EMUL
INTRAVENOUS | Status: DC | PRN
  Administered 2021-10-30: 25 ug/kg/min via INTRAVENOUS

## 2021-10-30 MED ORDER — ALBUTEROL SULFATE HFA 108 (90 BASE) MCG/ACT IN AERS
INHALATION_SPRAY | RESPIRATORY_TRACT | Status: DC | PRN
  Administered 2021-10-30 (×2): 2 via RESPIRATORY_TRACT

## 2021-10-30 MED ORDER — ONDANSETRON HCL 4 MG/2ML IJ SOLN
INTRAMUSCULAR | Status: DC | PRN
  Administered 2021-10-30: 4 mg via INTRAVENOUS

## 2021-10-30 MED ORDER — ROCURONIUM BROMIDE 10 MG/ML (PF) SYRINGE
PREFILLED_SYRINGE | INTRAVENOUS | Status: DC | PRN
  Administered 2021-10-30: 50 mg via INTRAVENOUS

## 2021-10-30 MED ORDER — SODIUM CHLORIDE (PF) 0.9 % IJ SOLN
PREFILLED_SYRINGE | INTRAMUSCULAR | Status: DC | PRN
  Administered 2021-10-30: 1.5 mL

## 2021-10-30 MED ORDER — DEXAMETHASONE SODIUM PHOSPHATE 10 MG/ML IJ SOLN
INTRAMUSCULAR | Status: DC | PRN
  Administered 2021-10-30: 5 mg via INTRAVENOUS

## 2021-10-30 MED ORDER — EPINEPHRINE 1 MG/10ML IJ SOSY
PREFILLED_SYRINGE | INTRAMUSCULAR | Status: AC
  Filled 2021-10-30: qty 10

## 2021-10-30 MED ORDER — LACTATED RINGERS IV SOLN
INTRAVENOUS | Status: DC | PRN
Start: 2021-10-30 — End: 2021-10-30

## 2021-10-30 MED ORDER — SUCRALFATE 1 GM/10ML PO SUSP: 1.0000 g | Freq: Two times a day (BID) | ORAL | 2 refills | Status: DC

## 2021-10-30 MED ORDER — PROPOFOL 10 MG/ML IV BOLUS
INTRAVENOUS | Status: DC | PRN
  Administered 2021-10-30: 150 mg via INTRAVENOUS

## 2021-10-30 SURGICAL SUPPLY — 15 items

## 2021-10-30 NOTE — Anesthesia Procedure Notes (Signed)
Procedure Name: Intubation Date/Time: 10/30/2021 10:11 AM Performed by: Imagene Riches, CRNA Pre-anesthesia Checklist: Patient identified, Emergency Drugs available, Suction available and Patient being monitored Patient Re-evaluated:Patient Re-evaluated prior to induction Oxygen Delivery Method: Circle System Utilized Preoxygenation: Pre-oxygenation with 100% oxygen Induction Type: IV induction Ventilation: Mask ventilation without difficulty Laryngoscope Size: Miller and 2 Grade View: Grade I Tube type: Oral Tube size: 7.5 mm Number of attempts: 1 Airway Equipment and Method: Stylet and Oral airway Placement Confirmation: ETT inserted through vocal cords under direct vision, positive ETCO2 and breath sounds checked- equal and bilateral Secured at: 22 cm Tube secured with: Tape Dental Injury: Teeth and Oropharynx as per pre-operative assessment

## 2021-10-30 NOTE — Anesthesia Preprocedure Evaluation (Signed)
Anesthesia Evaluation  Patient identified by MRN, date of birth, ID band Patient awake    Reviewed: Allergy & Precautions, H&P , NPO status , Patient's Chart, lab work & pertinent test results  History of Anesthesia Complications (+) PONV and history of anesthetic complications  Airway Mallampati: II   Neck ROM: full    Dental   Pulmonary asthma ,    breath sounds clear to auscultation       Cardiovascular hypertension,  Rhythm:regular Rate:Normal     Neuro/Psych PSYCHIATRIC DISORDERS Depression  Neuromuscular disease    GI/Hepatic GERD  ,Gastric CA   Endo/Other  diabetes, Type 2  Renal/GU      Musculoskeletal  (+) Arthritis ,   Abdominal   Peds  Hematology   Anesthesia Other Findings   Reproductive/Obstetrics                             Anesthesia Physical Anesthesia Plan  ASA: 3  Anesthesia Plan: General   Post-op Pain Management:    Induction: Intravenous  PONV Risk Score and Plan: 4 or greater and Ondansetron, Dexamethasone, Midazolam and Treatment may vary due to age or medical condition  Airway Management Planned: Oral ETT  Additional Equipment:   Intra-op Plan:   Post-operative Plan: Extubation in OR  Informed Consent: I have reviewed the patients History and Physical, chart, labs and discussed the procedure including the risks, benefits and alternatives for the proposed anesthesia with the patient or authorized representative who has indicated his/her understanding and acceptance.     Dental advisory given  Plan Discussed with: CRNA, Anesthesiologist and Surgeon  Anesthesia Plan Comments:         Anesthesia Quick Evaluation
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
## 2021-10-30 NOTE — Op Note (Signed)
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 Marlaine Hind Stonega PA, PA, Dr. Harrell Lark Medicines:                General Anesthesia Complications:            No immediate complications. Estimated Blood Loss:     Estimated blood loss was minimal. Procedure:                Pre-Anesthesia Assessment:                           - Prior to the procedure, a History and Physical                            was performed, and patient medications and                            allergies were reviewed. The patient's tolerance of                            previous anesthesia was also reviewed. The risks                            and benefits of the procedure and the sedation                            options and risks were discussed with the patient.                            All questions were answered, and informed consent                            was obtained. Prior Anticoagulants: The patient has  taken no previous anticoagulant or antiplatelet                            agents. ASA Grade Assessment: III - A patient with                            severe systemic disease. After reviewing the risks                            and benefits, the patient was deemed in                            satisfactory condition to undergo the procedure.                            After obtaining informed consent, the endoscope was                            passed under direct vision. Throughout the                            procedure, the patient's blood pressure, pulse, and                            oxygen saturations were monitored continuously. The                            [id number]-H190 ([id number]) Olympus endoscope was introduced                            through the mouth, and advanced to the second part                            of duodenum. The [id number] ([id number]) Olympus linear                            ultrasound scope was introduced through the mouth,                            and advanced to the stomach for ultrasound                            examination. The upper EUS was accomplished without                            difficulty. The patient tolerated the procedure. Scope In: Scope Out: Findings:      ENDOSCOPIC FINDING: :      White nummular lesions were noted in the entire esophagus. Biopsies were       taken with a cold forceps for histology.      The Z-line was regular and was found 35 cm from the incisors.      A 2 cm hiatal hernia was present.      A single 22 mm semi-sessile polyp with now evidence  of active oozing and       stigmata of recent bleeding was found on the lesser curvature of the       stomach. After the rest of the procedure was complete, preparations were       made for mucosal resection. NBI imaging and White-light endoscopy was       done to demarcate the borders of the lesion. A 1:100,000 solution of       epinephrine was injected to raise the lesion (1.5 mL total). Snare       mucosal resection was performed. Resection and retrieval were complete.       To prevent bleeding after mucosal resection, three hemostatic clips were       successfully placed (MR conditional). There was no bleeding at the end       of the procedure.      Segmental moderate inflammation characterized by erosions, erythema,        friability and granularity was found in the entire examined stomach.       This was previously biopsied and negative for HP.      No gross lesions were noted in the duodenal bulb, in the first portion       of the duodenum and in the second portion of the duodenum.      ENDOSONOGRAPHIC FINDING: :      A rounded intramural (subepithelial) lesion was found in the       cardia/fundus of the stomach. It was encountered at 5-8 cm distal to the       gastroesophageal junction. The lesion was hypoechoic. Sonographically,       the lesion appeared to originate from the muscularis propria (Layer 4).       The lesion measured 35 mm (in maximum thickness). The lesion also       measured 32 mm in diameter. The outer endosonographic borders were well       defined. Fine needle biopsy was performed. Color Doppler imaging was       utilized prior to needle puncture to confirm a lack of significant       vascular structures within the needle path. Nine passes were made with       the 22 gauge SharkCore biopsy needle using a transgastric approach. A       visible core of tissue was obtained. Preliminary cytologic examination       and touch preps were performed. Final cytology results are pending. Impression:               EGD Impression:                           - White nummular lesions in esophageal mucosa.                            Biopsied.                           - Z-line regular, 35 cm from the incisors.                           - 2 cm hiatal hernia.                           - A single  gastric polyp with active oozing and                            recent stigmata of bleeding. Resected and retrieved                            via mucosal resection. Clips (MR conditional) were                            placed.                           - Gastritis - previously biopsied.                           - No gross lesions in the duodenal bulb, in the                            first portion of  the duodenum and in the second                            portion of the duodenum.                           EUS Impression:                           - An intramural (subepithelial) lesion was found in                            the cardia of the stomach and in the fundus of the                            stomach. The lesion appeared to originate from                            within the muscularis propria (Layer 4). Cytology                            results are pending. 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 Fine needle biopsy performed. Recommendation:           - The patient will be observed post-procedure,                            until all discharge criteria are met.                           - Discharge patient to home.                           -  Resume previous diet.                           - Observe patient's clinical course.                           - Await cytology results and await path results.                           - Continue PPI therapy.                           - Start Carafate twice daily.                           - Continue your present medications otherwise.                           - The findings and recommendations have been                            discussed with patient and family. Procedure Code(s):        --- Professional ---                           [id number], Esophagogastroduodenoscopy, flexible,                            transoral; with endoscopic mucosal resection                           [id number], Esophagogastroduodenoscopy, flexible,                            transoral; with transendoscopic ultrasound-guided                            intramural or transmural fine needle                            aspiration/biopsy(s), (includes endoscopic                            ultrasound examination limited to the esophagus,                             stomach or duodenum, and adjacent structures) Diagnosis Code(s):        --- Professional ---                           K22.8, Other specified diseases of esophagus                           K44.9, Diaphragmatic hernia without obstruction or                            gangrene  K31.7, Polyp of stomach and duodenum                           K29.70, Gastritis, unspecified, without bleeding                           K31.89, Other diseases of stomach and duodenum                           K92.9, Disease of digestive system, unspecified                           D50.9, Iron deficiency anemia, unspecified                           R93.3, Abnormal findings on diagnostic imaging of                            other parts of digestive tract CPT copyright 2019 [employer]. All rights reserved. The codes documented in this report are preliminary and upon coder review may  be revised to meet current compliance requirements. Justice Britain, MD 10/30/2021 11:40:21 AM Number of Addenda: 0

## 2021-10-30 NOTE — Transfer of Care (Signed)
Immediate Anesthesia Transfer of Care Note  Patient: Joanna [name]  Procedure(s) Performed: ESOPHAGOGASTRODUODENOSCOPY (EGD) WITH PROPOFOL UPPER ENDOSCOPIC ULTRASOUND (EUS) LINEAR FINE NEEDLE ASPIRATION (FNA) LINEAR SCLEROTHERAPY BIOPSY HEMOSTASIS CLIP PLACEMENT POLYPECTOMY  Patient Location: PACU  Anesthesia Type:General  Level of Consciousness: drowsy  Airway & Oxygen Therapy: Patient Spontanous Breathing and Patient connected to nasal cannula oxygen  Post-op Assessment: Report given to RN and Post -op Vital signs reviewed and stable  Post vital signs: Reviewed and stable  Last Vitals:  Vitals Value Taken Time  BP 107/72 10/30/21 1130  Temp    Pulse 66 10/30/21 1133  Resp 9 10/30/21 1133  SpO2 99 % 10/30/21 1133  Vitals shown include unvalidated device data.  Last Pain:  Vitals:   10/30/21 0850  TempSrc: (P) Oral  PainSc: (P) 0-No pain         Complications: No notable events documented.

## 2021-10-31 ENCOUNTER — Encounter: Payer: Self-pay | Admitting: Gastroenterology

## 2021-10-31 LAB — SURGICAL PATHOLOGY

## 2021-10-31 NOTE — Anesthesia Postprocedure Evaluation (Signed)
Anesthesia Post Note  Patient: Joanna [name]  Procedure(s) Performed: ESOPHAGOGASTRODUODENOSCOPY (EGD) WITH PROPOFOL UPPER ENDOSCOPIC ULTRASOUND (EUS) LINEAR FINE NEEDLE ASPIRATION (FNA) LINEAR SCLEROTHERAPY BIOPSY HEMOSTASIS CLIP PLACEMENT POLYPECTOMY     Patient location during evaluation: PACU Anesthesia Type: General Level of consciousness: awake and alert Pain management: pain level controlled Vital Signs Assessment: post-procedure vital signs reviewed and stable Respiratory status: spontaneous breathing, nonlabored ventilation, respiratory function stable and patient connected to nasal cannula oxygen Cardiovascular status: blood pressure returned to baseline and stable Postop Assessment: no apparent nausea or vomiting Anesthetic complications: no   No notable events documented.  Last Vitals:  Vitals:   10/30/21 1130 10/30/21 1145  BP: 107/72 (!) 149/76  Pulse: 94 71  Resp: 14 (!) 21  Temp: 36.5 C   SpO2: 94% 92%    Last Pain:  Vitals:   10/30/21 1130  TempSrc:   PainSc: 0-No pain                 [place] Dani S

## 2021-11-03 ENCOUNTER — Encounter (HOSPITAL_COMMUNITY): Payer: Self-pay | Admitting: Gastroenterology

## 2021-11-04 LAB — CYTOLOGY - NON PAP

## 2021-11-07 ENCOUNTER — Ambulatory Visit (INDEPENDENT_AMBULATORY_CARE_PROVIDER_SITE_OTHER): Payer: Medicare HMO

## 2021-11-07 DIAGNOSIS — J309 Allergic rhinitis, unspecified: Secondary | ICD-10-CM | POA: Diagnosis not present

## 2021-11-08 ENCOUNTER — Other Ambulatory Visit: Payer: Self-pay

## 2021-11-08 ENCOUNTER — Other Ambulatory Visit: Payer: Medicare HMO

## 2021-11-08 ENCOUNTER — Ambulatory Visit
Admission: RE | Admit: 2021-11-08 | Discharge: 2021-11-08 | Disposition: A | Discharge status: Home / Self Care | Payer: Medicare HMO | Source: Ambulatory Visit | Attending: Surgery | Admitting: Surgery

## 2021-11-08 DIAGNOSIS — K3189 Other diseases of stomach and duodenum: Secondary | ICD-10-CM

## 2021-11-08 DIAGNOSIS — R1900 Intra-abdominal and pelvic swelling, mass and lump, unspecified site: Secondary | ICD-10-CM

## 2021-11-08 IMAGING — MR MR PELVIS WO/W CM
4 of 7 series · 20 of 48 positions shown · IV contrast (multihance)
Comparison: CT chest, [DATE]

CLINICAL DATA: Abdominal mass, gastric lesion incidentally
identified by prior CT and with neoplastic histology by endoscopy

EXAM:
MRI ABDOMEN AND PELVIS WITHOUT AND WITH CONTRAST
TECHNIQUE: Multiplanar multisequence MR imaging of the abdomen and pelvis was
performed both before and after the administration of intravenous
contrast.
CONTRAST:  20mL MULTIHANCE GADOBENATE DIMEGLUMINE 529 MG/ML IV SOLN

[Series 4: T2 · coronal · 6.0mm · 1.48mm/px · 6 of 34 slices shown]
[im 1/34]
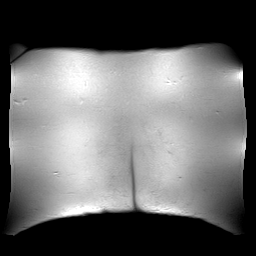
[im 7/34]
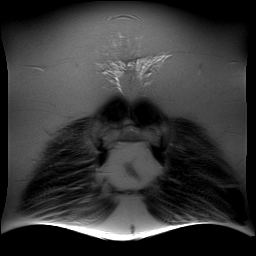
[im 14/34]
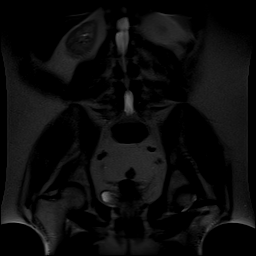
[im 20/34]
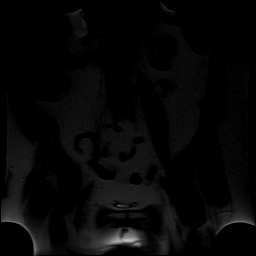
[im 27/34]
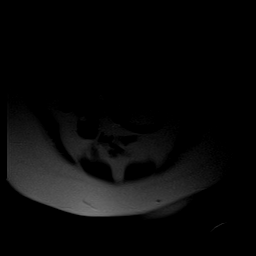
[im 34/34]
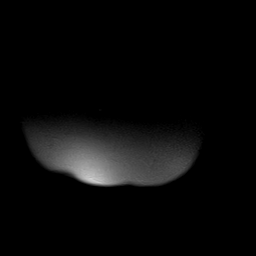

[Series 5: t2_tse_sag · sagittal · 6.5mm · 0.47mm/px · 8 of 40 slices shown]
[im 1/40]
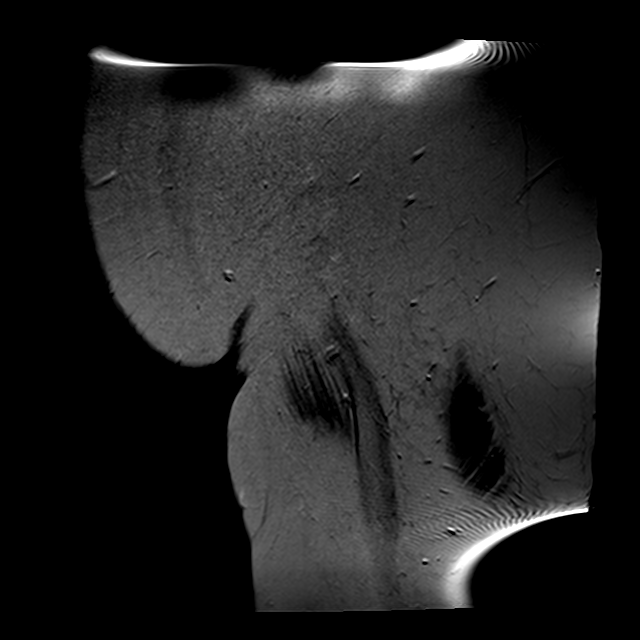
[im 6/40]
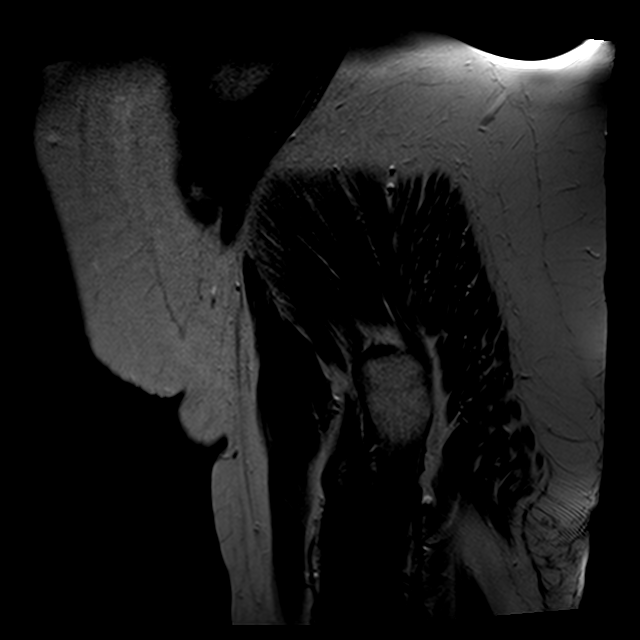
[im 12/40]
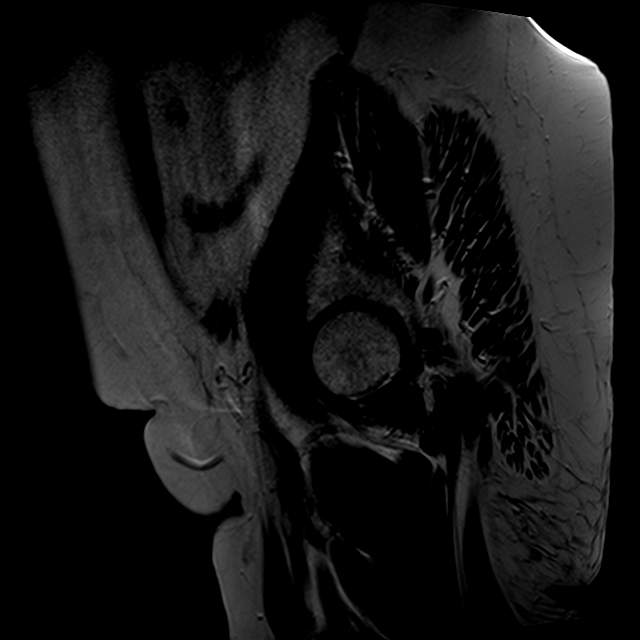
[im 17/40]
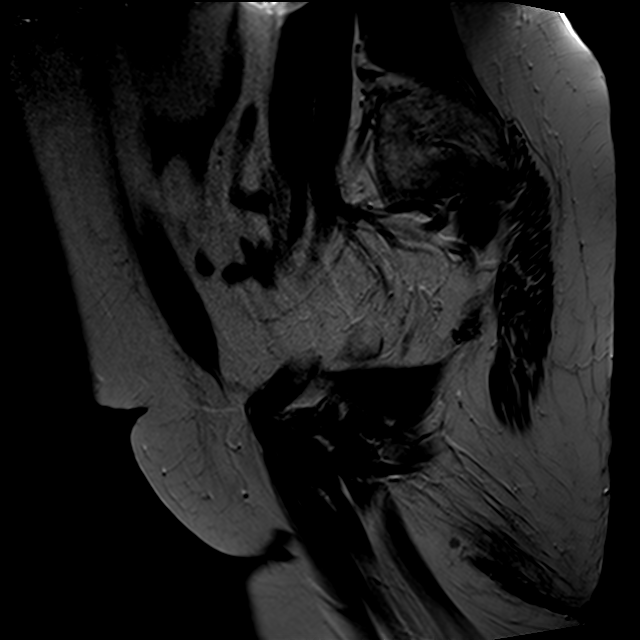
[im 23/40]
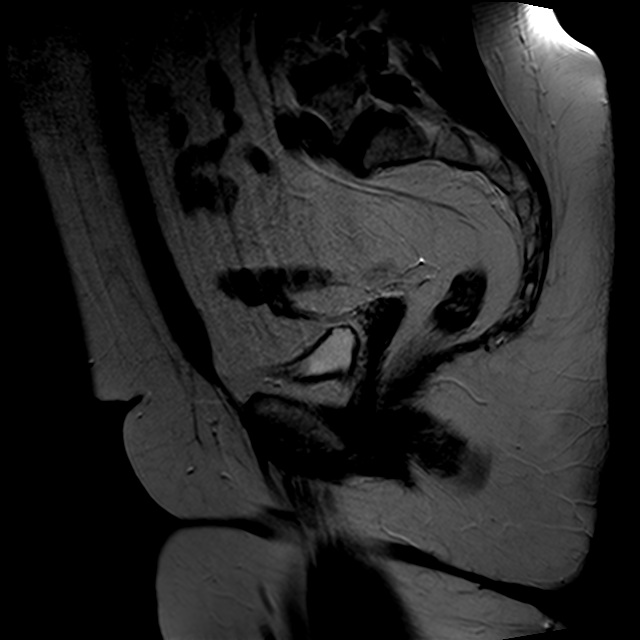
[im 28/40]
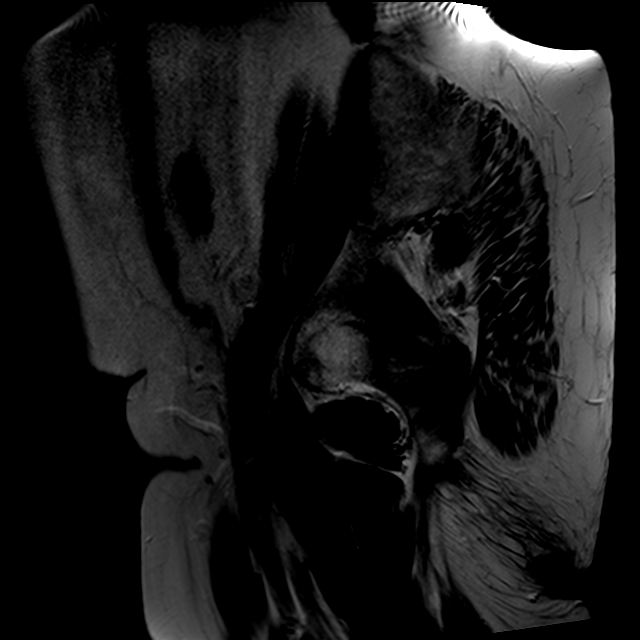
[im 34/40]
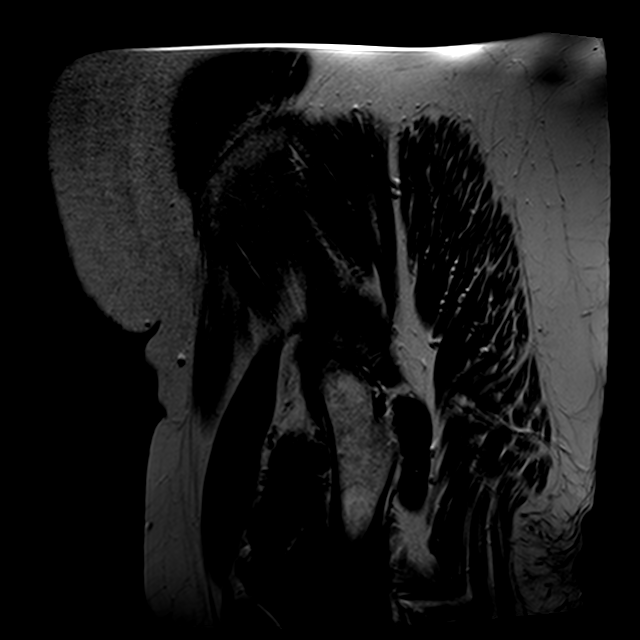
[im 40/40]
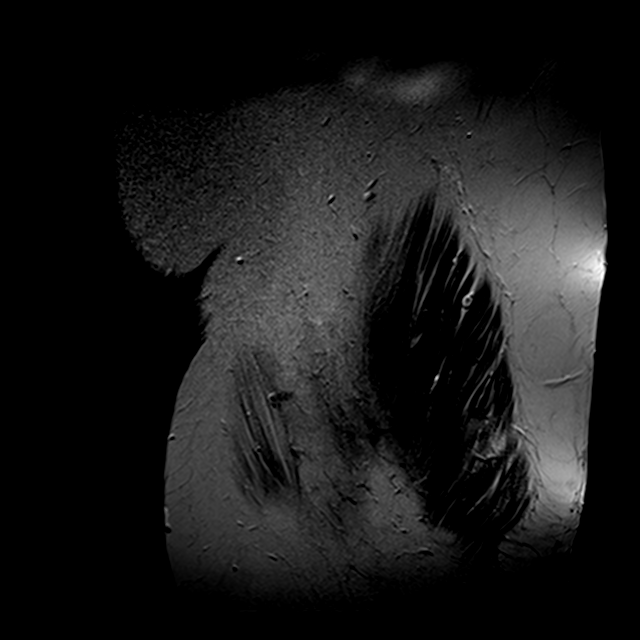

[Series 6: t2_tse axial · axial · 5.0mm · 0.47mm/px · z∈[-93,+141]mm · 3 of 51 slices shown]
[im 6/51]
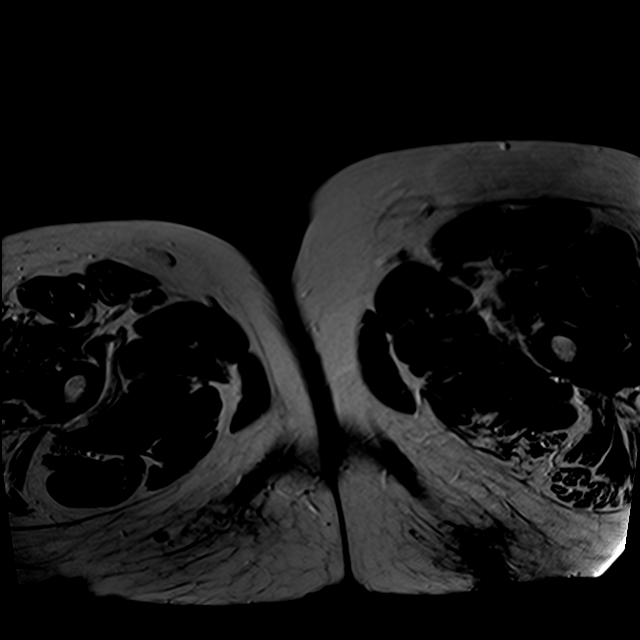
[im 28/51]
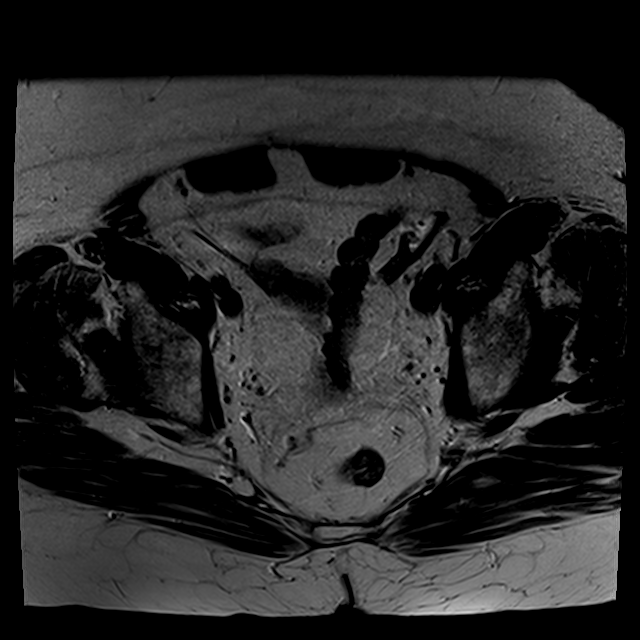
[im 45/51]
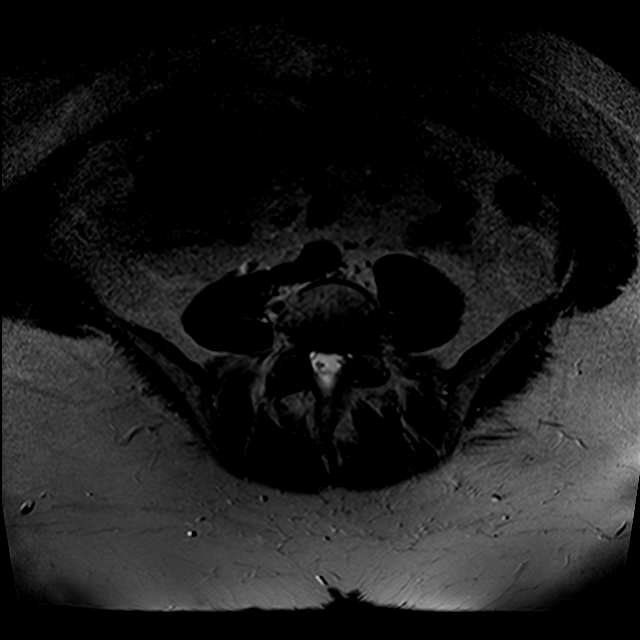

[Series 7: axial spgr · axial · 7.0mm · 1.17mm/px · z∈[-73,+120]mm · 3 of 38 slices shown]
[im 7/38]
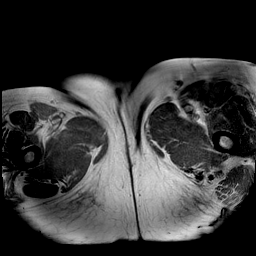
[im 19/38]
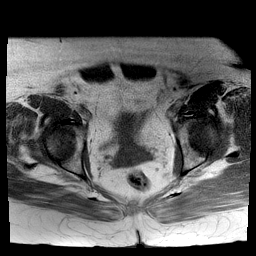
[im 31/38]
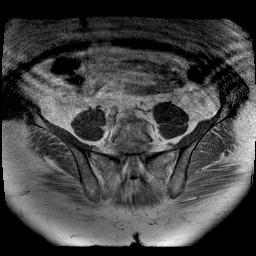

[20 of 48 positions shown; findings below may reference images not displayed]

FINDINGS: COMBINED FINDINGS FOR BOTH MR ABDOMEN AND PELVIS

Lower chest: No acute findings.  Cardiomegaly.

Hepatobiliary: Mild hepatic steatosis. No mass or other parenchymal
abnormality identified. No gallstones. No biliary ductal dilatation.

Pancreas: No mass, inflammatory changes, or other parenchymal
abnormality identified. No pancreatic ductal dilatation.

Spleen:  Mild splenomegaly, maximum coronal span 13.7 cm.

Adrenals/Urinary Tract: No masses identified. No evidence of
hydronephrosis.

Stomach/Bowel: Susceptibility artifact about the gastric antrum,
likely due to ingested material. Contrast enhancing exophytic
submucosal lesion of the gastric fundus measuring 2.9 x 2.9 cm
(series 12, image 37). Occasional sigmoid diverticula.

Vascular/Lymphatic: No pathologically enlarged lymph nodes
identified. No abdominal aortic aneurysm demonstrated. Infrarenal
IVC filter with associated susceptibility artifact.

Reproductive: Uterus and bilateral adnexa are unremarkable.

Other:  None.

Musculoskeletal: No suspicious bone lesions identified.
IMPRESSION: 1. Contrast enhancing exophytic submucosal lesion of the gastric
fundus measuring 2.9 x 2.9 cm, as seen on prior CT and in keeping
with biopsy proven neoplasm. Imaging appearance suggests GIST.
2. No evidence of lymphadenopathy or metastatic disease in the
abdomen or pelvis.
3. Mild hepatic steatosis.
4. Mild splenomegaly.
5. Infrarenal IVC filter.

## 2021-11-08 IMAGING — MR MR ABDOMEN WO/W CM
8 of 14 series · 24 of 48 positions shown · IV contrast (20ml Multihance)
Comparison: CT chest, [DATE]

CLINICAL DATA: Abdominal mass, gastric lesion incidentally
identified by prior CT and with neoplastic histology by endoscopy

EXAM:
MRI ABDOMEN AND PELVIS WITHOUT AND WITH CONTRAST
TECHNIQUE: Multiplanar multisequence MR imaging of the abdomen and pelvis was
performed both before and after the administration of intravenous
contrast.
CONTRAST:  20mL MULTIHANCE GADOBENATE DIMEGLUMINE 529 MG/ML IV SOLN

[Series 5: T2 · coronal · 6.5mm · 1.64mm/px · 1 of 35 slices shown (1 of 2)]
[im 1/35]
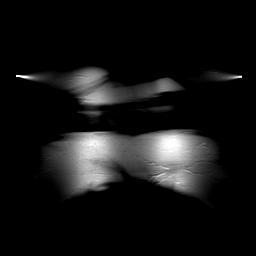

[Series 6: axial tru fisp=abdomen · axial · 3.5mm · 1.64mm/px · z∈[+134,+398]mm · 2 of 59 slices shown]
[im 1/59]
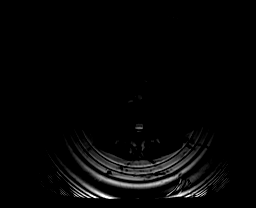
[im 59/59]
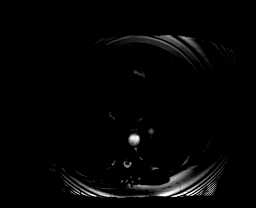

[Series 7: T2 · axial · 4.0mm · 0.82mm/px · z∈[+136,+433]mm · 2 of 54 slices shown (2 of 2)]
[im 1/54]
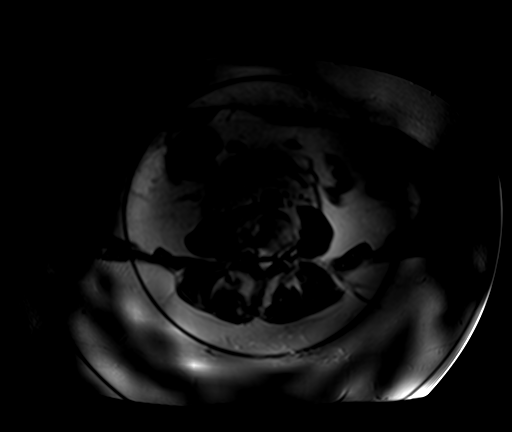
[im 54/54]
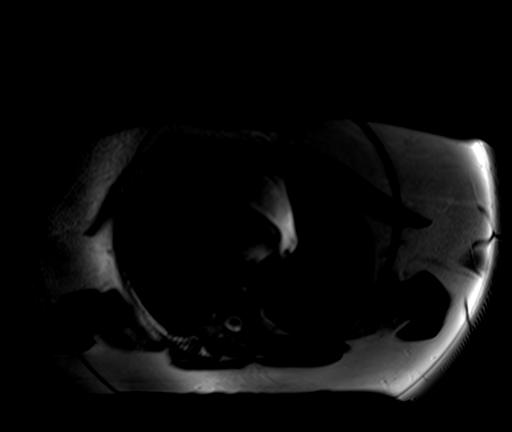

[Series 8: axial in out · axial · 6.0mm · 0.78mm/px · z∈[+122,+410]mm · 3 of 84 slices shown]
[im 1/84]
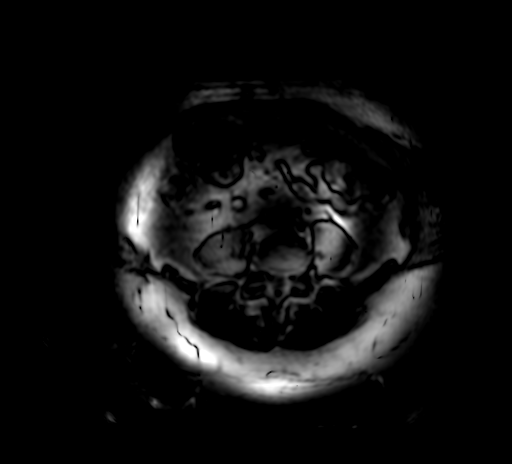
[im 42/84]
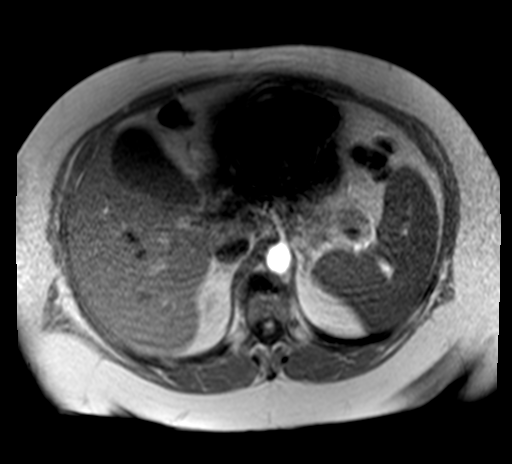
[im 84/84]
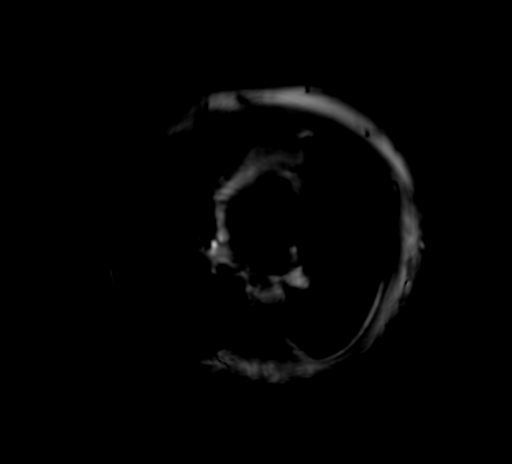

[Series 9: T1 dynamic · axial · non-contrast · 2.5mm · 0.80mm/px · z∈[+138,+395]mm · 4 of 104 slices shown]
[im 1/104]
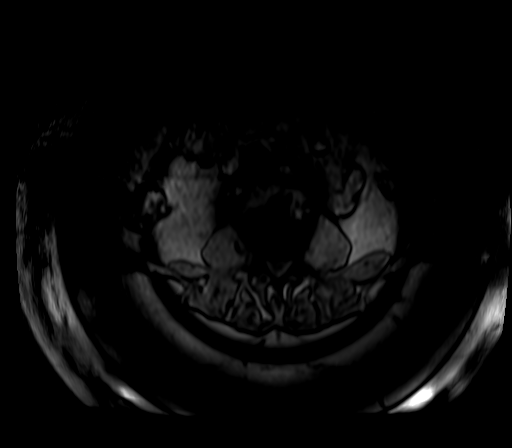
[im 35/104]
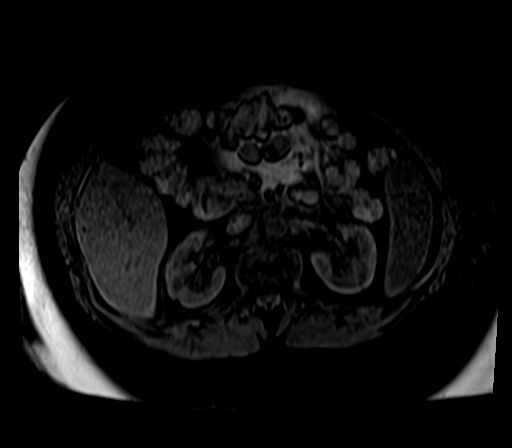
[im 69/104]
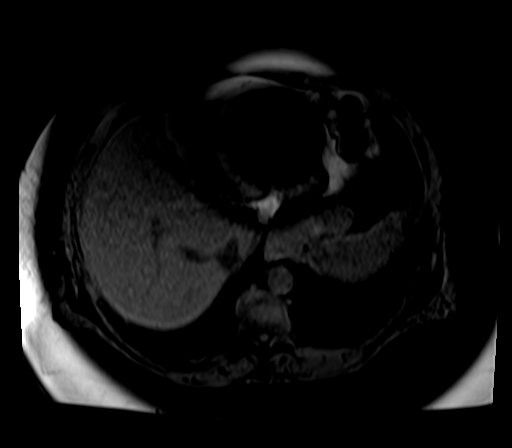
[im 104/104]
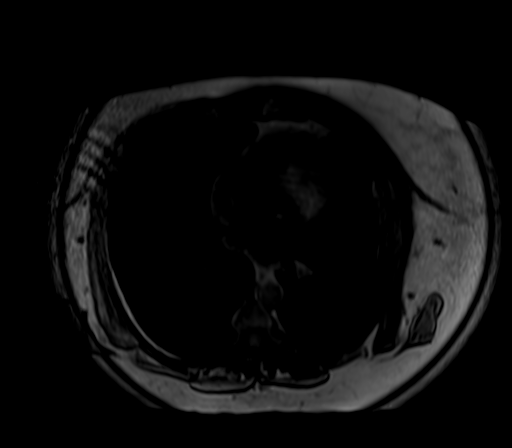

[Series 10: post 20 sec=abdomen · axial · 2.5mm · 0.80mm/px · z∈[+138,+395]mm · 4 of 104 slices shown]
[im 1/104]
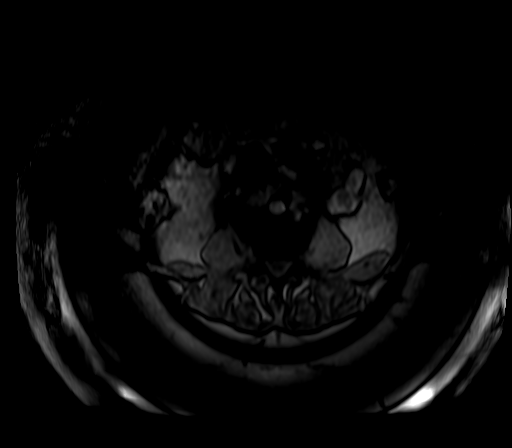
[im 35/104]
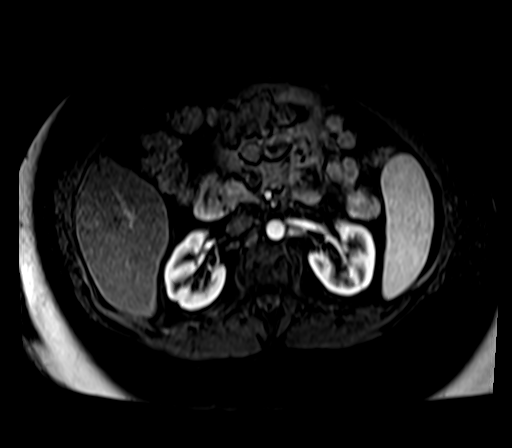
[im 69/104]
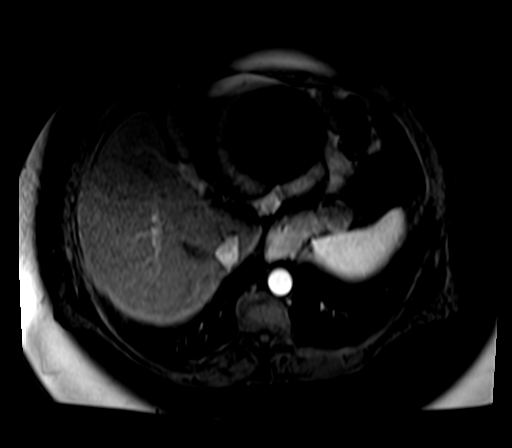
[im 104/104]
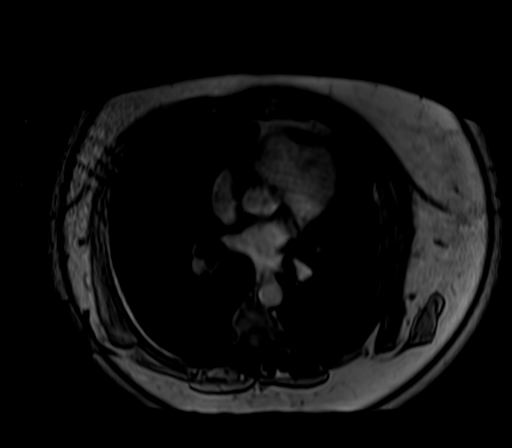

[Series 11: post 20 sec=abdomen_sub · axial · 2.5mm · 0.80mm/px · z∈[+138,+395]mm · 4 of 104 slices shown]
[im 1/104]
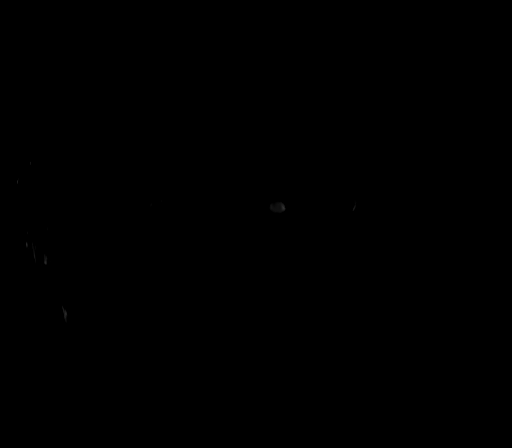
[im 35/104]
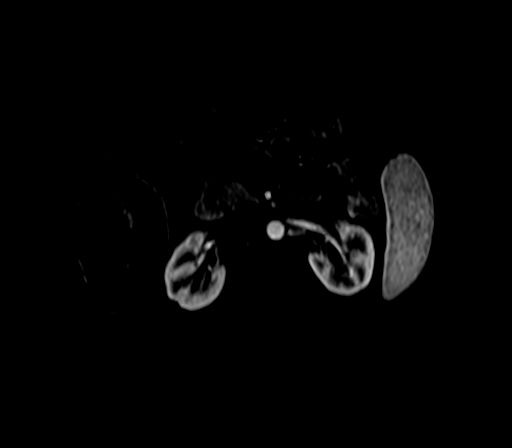
[im 69/104]
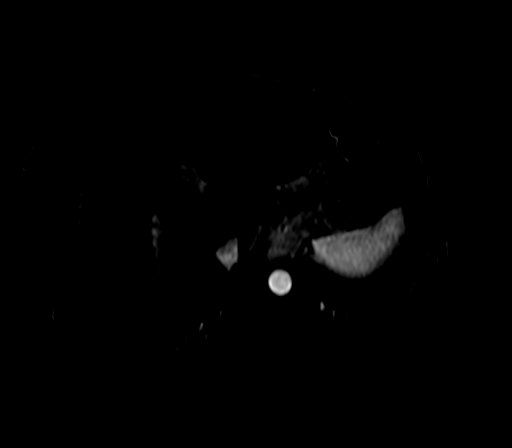
[im 104/104]
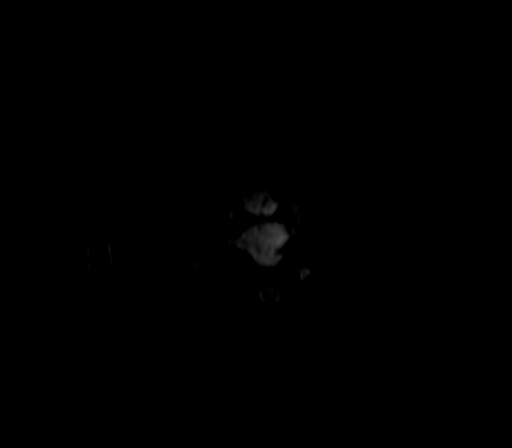

[Series 12: post 45 sec=abdomen · axial · 2.5mm · 0.80mm/px · z∈[+138,+395]mm · 4 of 104 slices shown]
[im 1/104]
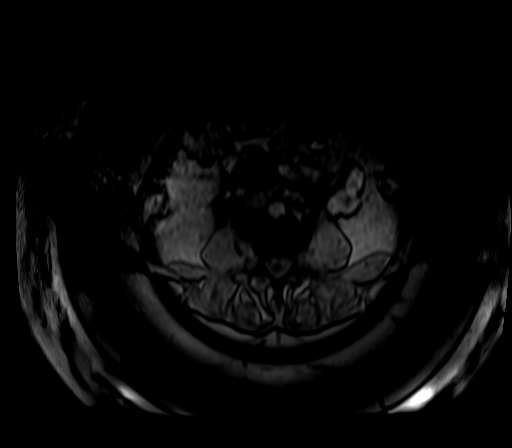
[im 35/104]
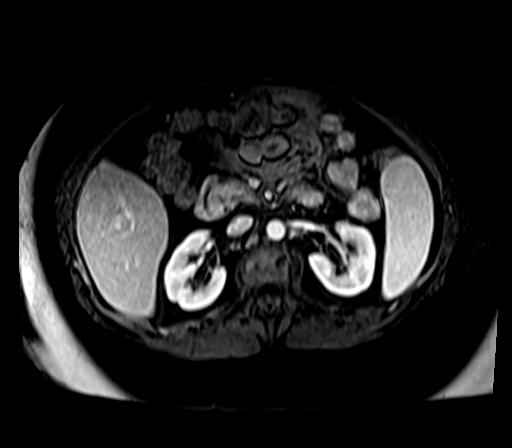
[im 69/104]
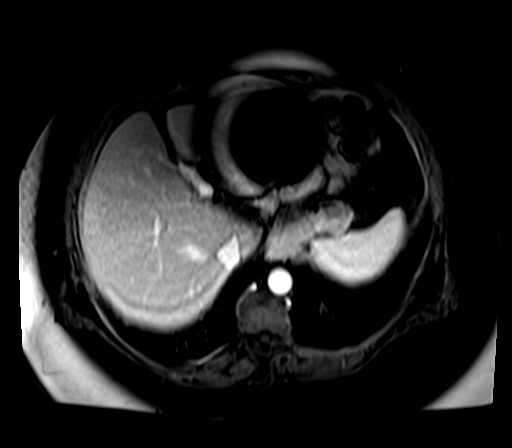
[im 104/104]
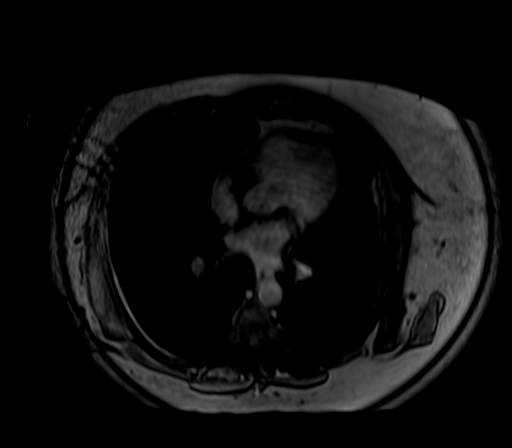

[24 of 48 positions shown; findings below may reference images not displayed]

FINDINGS: COMBINED FINDINGS FOR BOTH MR ABDOMEN AND PELVIS

Lower chest: No acute findings.  Cardiomegaly.

Hepatobiliary: Mild hepatic steatosis. No mass or other parenchymal
abnormality identified. No gallstones. No biliary ductal dilatation.

Pancreas: No mass, inflammatory changes, or other parenchymal
abnormality identified. No pancreatic ductal dilatation.

Spleen:  Mild splenomegaly, maximum coronal span 13.7 cm.

Adrenals/Urinary Tract: No masses identified. No evidence of
hydronephrosis.

Stomach/Bowel: Susceptibility artifact about the gastric antrum,
likely due to ingested material. Contrast enhancing exophytic
submucosal lesion of the gastric fundus measuring 2.9 x 2.9 cm
(series 12, image 37). Occasional sigmoid diverticula.

Vascular/Lymphatic: No pathologically enlarged lymph nodes
identified. No abdominal aortic aneurysm demonstrated. Infrarenal
IVC filter with associated susceptibility artifact.

Reproductive: Uterus and bilateral adnexa are unremarkable.

Other:  None.

Musculoskeletal: No suspicious bone lesions identified.
IMPRESSION: 1. Contrast enhancing exophytic submucosal lesion of the gastric
fundus measuring 2.9 x 2.9 cm, as seen on prior CT and in keeping
with biopsy proven neoplasm. Imaging appearance suggests GIST.
2. No evidence of lymphadenopathy or metastatic disease in the
abdomen or pelvis.
3. Mild hepatic steatosis.
4. Mild splenomegaly.
5. Infrarenal IVC filter.

## 2021-11-08 MED ORDER — GADOBENATE DIMEGLUMINE 529 MG/ML IV SOLN
20.0000 mL | Freq: Once | INTRAVENOUS | Status: AC | PRN
  Administered 2021-11-08: 20 mL via INTRAVENOUS

## 2021-11-10 ENCOUNTER — Telehealth: Payer: Self-pay | Admitting: *Deleted

## 2021-11-10 NOTE — Telephone Encounter (Signed)
Pt received allergy shots on Friday 1/13- she called and said Saturday her left arm was red, hot, swollen from her elbow to her arm pit. She believes the shot was given too far back. I told her we would drop her dose with her next injections just to be safe and we will make sure to not give injection too far back on her arm.

## 2021-11-17 ENCOUNTER — Other Ambulatory Visit: Payer: Self-pay | Admitting: Allergy & Immunology

## 2021-11-19 ENCOUNTER — Ambulatory Visit: Payer: Self-pay | Admitting: Surgery

## 2021-11-19 NOTE — H&P (Signed)
History of Present Illness: Joanna [name] is a 56 y.o. female who is seen today for follow up of a gastric mass.  Since her last visit she underwent a repeat EGD/EUS on 10/30/2021.  This showed a hyperplastic polyp on the lesser curve of the stomach, which was removed.  EUS showed a 3.5 cm intramural lesion at the fundus of the stomach corresponding to the mass seen on imaging.  There was no additional lesser curve mass. FNA of the mass was performed and showed neoplastic cells but did not yield a specific diagnosis. The mass appeared to originate in the muscularis propria on EUS. 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   As her last visit the patient has not had any new symptoms.  She denies abdominal pain, nausea, and vomiting.  She has had no changes to her medical history.   Review of Systems: A complete review of systems was obtained from the patient.  I have reviewed this information and discussed as appropriate with the patient.  See HPI as well for other ROS.     Medical History: Past Medical History Past Medical History: Diagnosis Date  Anemia    Anxiety    Arthritis    Asthma, unspecified asthma severity, unspecified whether complicated, unspecified whether persistent    Diabetes mellitus without complication (CMS-HCC)    DVT (deep venous thrombosis) (CMS-HCC)    GERD (gastroesophageal reflux disease)    Hyperlipidemia    Hypertension        Patient Active Problem List Diagnosis  Gastric mass     Past Surgical History Past Surgical History: Procedure Laterality Date  JOINT REPLACEMENT      R amputation Right        Allergies Allergies Allergen Reactions  Iodinated Contrast Media Anaphylaxis  Penicillins Itching, Hives, Rash and Swelling     Other reaction(s): Difficulty Breathing Other reaction(s): Swelling Other reaction(s): Swelling Other reaction(s): Swelling Other reaction(s): Swelling    Sulfa  (Sulfonamide Antibiotics) Anaphylaxis, Hives, Itching, Rash and Swelling      Current Outpatient Medications on File Prior to Visit Medication Sig Dispense Refill  albuterol (PROVENTIL) 2.5 mg /3 mL (0.083 %) nebulizer solution Inhale 3 mLs (2.5 mg total) into the lungs every 4 (four) hours as needed      aspirin 325 MG tablet Take by mouth      atenoloL (TENORMIN) 25 MG tablet Take 1 tablet (25 mg total) by mouth once daily      benralizumab (FASENRA) 30 mg/mL injection Inject subcutaneously      budesonide-glycopyrrolate-formoterol (BREZTRI AEROSPHERE) 160-9-4.8 mcg/actuation inhaler 2 puffs twice daily with spacer to prevent coughing or wheezing      calcium citrate-vitamin D3 (CITRACAL+D) 315 mg-5 mcg (200 unit) tablet Take 1 tablet by mouth 2 (two) times daily      celecoxib (CELEBREX) 200 MG capsule        cholecalciferol (VITAMIN D3) 2,000 unit capsule Take 1 capsule (2,000 Units total) by mouth once daily      dexlansoprazole (DEXILANT) 60 mg DR capsule Take 1 capsule (60 mg total) by mouth 2 (two) times daily      ergocalciferol, vitamin D2, 1,250 mcg (50,000 unit) capsule Take 1 capsule (50,000 Units total) by mouth every 7 (seven) days      ezetimibe-simvastatin (VYTORIN) 10-40 mg tablet Take 1 tablet by mouth at bedtime      fexofenadine (ALLEGRA) 180 MG tablet Take 1 tablet (180  mg total) by mouth once daily      fluticasone propionate (FLONASE) 50 mcg/actuation nasal spray Place into one nostril      FUROsemide (LASIX) 40 MG tablet Take 1 tablet (40 mg total) by mouth once daily      gabapentin (NEURONTIN) 300 MG capsule Take by mouth      ipratropium (ATROVENT) 21 mcg (0.03 %) nasal spray        montelukast (SINGULAIR) 10 mg tablet Take 1 tablet (10 mg total) by mouth at bedtime      potassium chloride (KLOR-CON) 20 mEq packet Take by mouth      semaglutide (RYBELSUS) 3 mg tablet Take by mouth      sertraline (ZOLOFT) 100 MG tablet Take by mouth      sucralfate (CARAFATE) 100  mg/mL suspension        tapentadoL 100 mg Tb12 Take 1 tablet (100 mg total) by mouth every 12 (twelve) hours      tiotropium-olodateroL (STIOLTO RESPIMAT) 2.5-2.5 mcg/actuation inhaler Inhale into the lungs      traZODone (DESYREL) 100 MG tablet Take 1 tablet (100 mg total) by mouth at bedtime      valsartan-hydrochlorothiazide (DIOVAN-HCT) 160-12.5 mg tablet         No current facility-administered medications on file prior to visit.     Family History Family History Problem Relation Age of Onset  Obesity Mother    High blood pressure (Hypertension) Mother    Hyperlipidemia (Elevated cholesterol) Mother    Diabetes Mother    Obesity Sister    High blood pressure (Hypertension) Sister    Hyperlipidemia (Elevated cholesterol) Sister    Diabetes Sister    Deep vein thrombosis (DVT or abnormal blood clot formation) Sister        Social History   Tobacco Use Smoking Status Never Smokeless Tobacco Never     Social History Social History    Socioeconomic History  Marital status: Married Tobacco Use  Smoking status: Never  Smokeless tobacco: Never Vaping Use  Vaping Use: Never used Substance and Sexual Activity  Alcohol use: Not Currently  Drug use: Never      Objective:     There were no vitals filed for this visit.  There is no height or weight on file to calculate BMI.   Physical Exam Constitutional:      General: She is not in acute distress. HENT:     Head: Normocephalic and atraumatic.  Eyes:     General: No scleral icterus.    Conjunctiva/sclera: Conjunctivae normal.  Cardiovascular:     Rate and Rhythm: Normal rate and regular rhythm.  Pulmonary:     Effort: Pulmonary effort is normal. No respiratory distress.     Breath sounds: Normal breath sounds. No wheezing.  Abdominal:     General: There is no distension.     Palpations: Abdomen is soft.     Tenderness: There is no abdominal tenderness.  Musculoskeletal:     Cervical back: Normal range of  motion.  Neurological:     Mental Status: She is alert.         Labs, Imaging and Diagnostic Testing: FNA/cytology: FINAL MICROSCOPIC DIAGNOSIS:  - Neoplastic cells present  - See comment    SPECIMEN ADEQUACY:  Satisfactory for evaluation    IMMEDIATE EVALUATION:  LOW GRADE EPITHELIAL PROLIFERATION: DLB 10/30/2021    DIAGNOSTIC COMMENTS:  The differential diagnosis includes low-grade adenocarcinoma and  low-grade neuroendocrine tumor; immunohistochemistry is pending and will  be reported in an addendum.  Dr. Saralyn Pilar reviewed the case and agrees  with the above diagnosis.    ADDENDUM:  By immunohistochemistry, the neoplastic cells are positive for  cytokeratin AE1/3 and cytokeratin 7 but negative for cytokeratin 20,  CDX2, CD56, chromogranin and synaptophysin.  The immunoprofile does not  support a neuroendocrine neoplasm.      Assessment and Plan:    Diagnoses and all orders for this visit:   Gastric mass       Is a 56 year old female with a mass on the greater curve of the stomach. I reviewed her CT scan, EUS, and MRI images.  Imaging and EUS findings are most consistent with a GIST, although the FNA has not confirmed this diagnosis.  I discussed the case with Dr. Rush Landmark and confirmed that there was only one mass visualized on EUS and is on the greater curve; there are no lesser curve masses.  This mass is amenable to resection via a laparoscopic gastric wedge resection.  I discussed the details of this procedure with the patient today, occluding the benefits and risks of bleeding and infection.  She will be admitted to the hospital 1-2 nights following the procedure.  She expressed understanding and agrees to proceed with surgery.  She will be contacted to schedule elective surgery date.  Michaelle Birks, [facility], Hepatobiliary and Pancreatic Surgery 11/19/21 4:16 PM

## 2021-11-19 NOTE — H&P (View-Only) (Signed)
History of Present Illness: Joanna [name] is a 56 y.o. female who is seen today for follow up of a gastric mass.  Since her last visit she underwent a repeat EGD/EUS on 10/30/2021.  This showed a hyperplastic polyp on the lesser curve of the stomach, which was removed.  EUS showed a 3.5 cm intramural lesion at the fundus of the stomach corresponding to the mass seen on imaging.  There was no additional lesser curve mass. FNA of the mass was performed and showed neoplastic cells but did not yield a specific diagnosis. The mass appeared to originate in the muscularis propria on EUS. 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   As her last visit the patient has not had any new symptoms.  She denies abdominal pain, nausea, and vomiting.  She has had no changes to her medical history.   Review of Systems: A complete review of systems was obtained from the patient.  I have reviewed this information and discussed as appropriate with the patient.  See HPI as well for other ROS.     Medical History: Past Medical History Past Medical History: Diagnosis Date  Anemia    Anxiety    Arthritis    Asthma, unspecified asthma severity, unspecified whether complicated, unspecified whether persistent    Diabetes mellitus without complication (CMS-HCC)    DVT (deep venous thrombosis) (CMS-HCC)    GERD (gastroesophageal reflux disease)    Hyperlipidemia    Hypertension        Patient Active Problem List Diagnosis  Gastric mass     Past Surgical History Past Surgical History: Procedure Laterality Date  JOINT REPLACEMENT      R amputation Right        Allergies Allergies Allergen Reactions  Iodinated Contrast Media Anaphylaxis  Penicillins Itching, Hives, Rash and Swelling     Other reaction(s): Difficulty Breathing Other reaction(s): Swelling Other reaction(s): Swelling Other reaction(s): Swelling Other reaction(s): Swelling    Sulfa  (Sulfonamide Antibiotics) Anaphylaxis, Hives, Itching, Rash and Swelling      Current Outpatient Medications on File Prior to Visit Medication Sig Dispense Refill  albuterol (PROVENTIL) 2.5 mg /3 mL (0.083 %) nebulizer solution Inhale 3 mLs (2.5 mg total) into the lungs every 4 (four) hours as needed      aspirin 325 MG tablet Take by mouth      atenoloL (TENORMIN) 25 MG tablet Take 1 tablet (25 mg total) by mouth once daily      benralizumab (FASENRA) 30 mg/mL injection Inject subcutaneously      budesonide-glycopyrrolate-formoterol (BREZTRI AEROSPHERE) 160-9-4.8 mcg/actuation inhaler 2 puffs twice daily with spacer to prevent coughing or wheezing      calcium citrate-vitamin D3 (CITRACAL+D) 315 mg-5 mcg (200 unit) tablet Take 1 tablet by mouth 2 (two) times daily      celecoxib (CELEBREX) 200 MG capsule        cholecalciferol (VITAMIN D3) 2,000 unit capsule Take 1 capsule (2,000 Units total) by mouth once daily      dexlansoprazole (DEXILANT) 60 mg DR capsule Take 1 capsule (60 mg total) by mouth 2 (two) times daily      ergocalciferol, vitamin D2, 1,250 mcg (50,000 unit) capsule Take 1 capsule (50,000 Units total) by mouth every 7 (seven) days      ezetimibe-simvastatin (VYTORIN) 10-40 mg tablet Take 1 tablet by mouth at bedtime      fexofenadine (ALLEGRA) 180 MG tablet Take 1 tablet (180  mg total) by mouth once daily      fluticasone propionate (FLONASE) 50 mcg/actuation nasal spray Place into one nostril      FUROsemide (LASIX) 40 MG tablet Take 1 tablet (40 mg total) by mouth once daily      gabapentin (NEURONTIN) 300 MG capsule Take by mouth      ipratropium (ATROVENT) 21 mcg (0.03 %) nasal spray        montelukast (SINGULAIR) 10 mg tablet Take 1 tablet (10 mg total) by mouth at bedtime      potassium chloride (KLOR-CON) 20 mEq packet Take by mouth      semaglutide (RYBELSUS) 3 mg tablet Take by mouth      sertraline (ZOLOFT) 100 MG tablet Take by mouth      sucralfate (CARAFATE) 100  mg/mL suspension        tapentadoL 100 mg Tb12 Take 1 tablet (100 mg total) by mouth every 12 (twelve) hours      tiotropium-olodateroL (STIOLTO RESPIMAT) 2.5-2.5 mcg/actuation inhaler Inhale into the lungs      traZODone (DESYREL) 100 MG tablet Take 1 tablet (100 mg total) by mouth at bedtime      valsartan-hydrochlorothiazide (DIOVAN-HCT) 160-12.5 mg tablet         No current facility-administered medications on file prior to visit.     Family History Family History Problem Relation Age of Onset  Obesity Mother    High blood pressure (Hypertension) Mother    Hyperlipidemia (Elevated cholesterol) Mother    Diabetes Mother    Obesity Sister    High blood pressure (Hypertension) Sister    Hyperlipidemia (Elevated cholesterol) Sister    Diabetes Sister    Deep vein thrombosis (DVT or abnormal blood clot formation) Sister        Social History   Tobacco Use Smoking Status Never Smokeless Tobacco Never     Social History Social History    Socioeconomic History  Marital status: Married Tobacco Use  Smoking status: Never  Smokeless tobacco: Never Vaping Use  Vaping Use: Never used Substance and Sexual Activity  Alcohol use: Not Currently  Drug use: Never      Objective:     There were no vitals filed for this visit.  There is no height or weight on file to calculate BMI.   Physical Exam Constitutional:      General: She is not in acute distress. HENT:     Head: Normocephalic and atraumatic.  Eyes:     General: No scleral icterus.    Conjunctiva/sclera: Conjunctivae normal.  Cardiovascular:     Rate and Rhythm: Normal rate and regular rhythm.  Pulmonary:     Effort: Pulmonary effort is normal. No respiratory distress.     Breath sounds: Normal breath sounds. No wheezing.  Abdominal:     General: There is no distension.     Palpations: Abdomen is soft.     Tenderness: There is no abdominal tenderness.  Musculoskeletal:     Cervical back: Normal range of  motion.  Neurological:     Mental Status: She is alert.         Labs, Imaging and Diagnostic Testing: FNA/cytology: FINAL MICROSCOPIC DIAGNOSIS:  - Neoplastic cells present  - See comment    SPECIMEN ADEQUACY:  Satisfactory for evaluation    IMMEDIATE EVALUATION:  LOW GRADE EPITHELIAL PROLIFERATION: DLB 10/30/2021    DIAGNOSTIC COMMENTS:  The differential diagnosis includes low-grade adenocarcinoma and  low-grade neuroendocrine tumor; immunohistochemistry is pending and will  be reported in an addendum.  Dr. Saralyn Pilar reviewed the case and agrees  with the above diagnosis.    ADDENDUM:  By immunohistochemistry, the neoplastic cells are positive for  cytokeratin AE1/3 and cytokeratin 7 but negative for cytokeratin 20,  CDX2, CD56, chromogranin and synaptophysin.  The immunoprofile does not  support a neuroendocrine neoplasm.      Assessment and Plan:    Diagnoses and all orders for this visit:   Gastric mass       Is a 56 year old female with a mass on the greater curve of the stomach. I reviewed her CT scan, EUS, and MRI images.  Imaging and EUS findings are most consistent with a GIST, although the FNA has not confirmed this diagnosis.  I discussed the case with Dr. Rush Landmark and confirmed that there was only one mass visualized on EUS and is on the greater curve; there are no lesser curve masses.  This mass is amenable to resection via a laparoscopic gastric wedge resection.  I discussed the details of this procedure with the patient today, occluding the benefits and risks of bleeding and infection.  She will be admitted to the hospital 1-2 nights following the procedure.  She expressed understanding and agrees to proceed with surgery.  She will be contacted to schedule elective surgery date.  Michaelle Birks, [facility], Hepatobiliary and Pancreatic Surgery 11/19/21 4:16 PM

## 2021-11-20 ENCOUNTER — Telehealth: Payer: Self-pay | Admitting: Gastroenterology

## 2021-11-20 NOTE — Telephone Encounter (Signed)
6 month colon recall placed per GM.

## 2021-11-20 NOTE — Telephone Encounter (Signed)
Inbound call from patient requesting to cancel upcoming procedure.  States will be having surgery and prefers to wait and reschedule at a later date.

## 2021-11-20 NOTE — Telephone Encounter (Signed)
Understood. Thank you for update. Put a recall in the system for 6 months from now if she has not called about scheduling that before then. Thanks. GM

## 2021-11-20 NOTE — Telephone Encounter (Signed)
The pt has called to cancel her upcoming procedure.  She states she will have surgery for GIST first and then call back to reschedule.

## 2021-11-26 ENCOUNTER — Other Ambulatory Visit (HOSPITAL_COMMUNITY): Payer: Self-pay

## 2021-11-27 NOTE — Progress Notes (Addendum)
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  - [facility] in [place]. Had follow up knee surgery 08/2020 and did fine with that surgery per pt  Janett Billow WArd, [facility] made aware of above issues with knee surgery in 07/2020.   PT had temp at preop of 99.7 and upon recheck was 99.2.  PT denies any covid symptoms.  Jessica WArd, PAc aware.  No new orders given.   Covid test- 12/08/21 at 0915am  Called on 12/03/21 and spoke with Abigail Butts in Triage at [facility] to clarify whether or not DR Zenia Resides wants a bowel prep prior to surgery.  There are no orders in epic for bowel prep but 3 Eras drinks are ordered and were given to pt at preop.  Abigail Butts to check with DR Zenia Resides and call back at [phone] for clarfication of this.   Called [facility] back on 2/`13/23 and per Elmyra Ricks Dr Zenia Resides states no bowel prep for pt.  Called pt and spoke with her on 12/08/21 and pt aware on 12/09/21 she can eat regular diet on 12/09/21 and to dirnk 2 eras drinks at 1000pm nite beforew surgery and the 3rd eras drink by 0600am of surgery.  Clear liquids until0600am mornig  of surgery.

## 2021-11-27 NOTE — Progress Notes (Signed)
Covid test on 12/08/20  Come thru main entrance at [facility]. Have a seat in the lobby on the right as you come thru the door.  Call [phone] and let them know you are here for covid testing                 Tekila Caillouet  11/27/2021   Your procedure is scheduled on:   12/10/21.   Report to [facility] Main  Entrance   Report to admitting at   [phone]     Call this number if you have problems the morning of surgery [phone]           REMEMBER: FOLLOW ALL YOUR BOWEL PREP INSTRUCTIONS WITH CLEAR LIQUIDS FROM YOUR SURGEON'S INSTRUCTIONS. DRINK 2 PRESURGERY ENSURE DRINKS THE NIGHT BEFORE SURGERY AT 1000 PM AND 1 PRESURGERY DRINK THE DAY OF THE PROCEDURE 3 HOURS PRIOR TO SCHEDULED SURGERY.  NOTHING BY MOUTH EXCEPT CLEAR LIQUIDS UNTIL THREE HOURS PRIOR TO SCHEDULED SURGERY. PLEASE FINISH PRESURGERY 3RD  ENSURE  DRINK PER SURGEON ORDER 3 HOURS PRIOR TO SCHEDULED SURGERY TIME WHICH NEEDS TO BE COMPLETED AT   0600am _________.     CLEAR LIQUID DIET   Foods Allowed                                                                      Coffee and tea, regular and decaf                           Plain Jell-O any favor except red or purple                                         Fruit ices (not with fruit pulp)                                      Iced Popsicles                                     Carbonated beverages, regular and diet                                    Cranberry, grape and apple juices Sports drinks like Gatorade Lightly seasoned clear broth or consume(fat free) Sugar  _____________________________________________________________________      BRUSH YOUR TEETH MORNING OF SURGERY AND RINSE YOUR MOUTH OUT, NO CHEWING GUM CANDY OR MINTS.     Take these medicines the morning of surgery with A SIP OF WATER:  nebulizer if needed, inhalers as usual and bring, atenolol, gabapentin, ditropan, zoloft   DO NOT TAKE ANY DIABETIC MEDICATIONS DAY OF YOUR SURGERY                                You may not have any metal on your  body including hair pins and              piercings  Do not wear jewelry, make-up, lotions, powders or perfumes, deodorant             Do not wear nail polish on your fingernails.  Do not shave  48 hours prior to surgery.              Men may shave face and neck.   Do not bring valuables to the hospital. [place].  Contacts, dentures or bridgework may not be worn into surgery.  Leave suitcase in the car. After surgery it may be brought to your room.                 Please read over the following fact sheets you were given: _____________________________________________________________________  [facility] - Preparing for Surgery Before surgery, you can play an important role.  Because skin is not sterile, your skin needs to be as free of germs as possible.  You can reduce the number of germs on your skin by washing with CHG (chlorahexidine gluconate) soap before surgery.  CHG is an antiseptic cleaner which kills germs and bonds with the skin to continue killing germs even after washing. Please DO NOT use if you have an allergy to CHG or antibacterial soaps.  If your skin becomes reddened/irritated stop using the CHG and inform your nurse when you arrive at Short Stay. Do not shave (including legs and underarms) for at least 48 hours prior to the first CHG shower.  You may shave your face/neck. Please follow these instructions carefully:  1.  Shower with CHG Soap the night before surgery and the  morning of Surgery.  2.  If you choose to wash your hair, wash your hair first as usual with your  normal  shampoo.  3.  After you shampoo, rinse your hair and body thoroughly to remove the  shampoo.                           4.  Use CHG as you would any other liquid soap.  You can apply chg directly  to the skin and wash                       Gently with a scrungie or clean washcloth.  5.  Apply  the CHG Soap to your body ONLY FROM THE NECK DOWN.   Do not use on face/ open                           Wound or open sores. Avoid contact with eyes, ears mouth and genitals (private parts).                       Wash face,  Genitals (private parts) with your normal soap.             6.  Wash thoroughly, paying special attention to the area where your surgery  will be performed.  7.  Thoroughly rinse your body with warm water from the neck down.  8.  DO NOT shower/wash with your normal soap after using and rinsing off  the CHG Soap.  9.  Pat yourself dry with a clean towel.            10.  Wear clean pajamas.            11.  Place clean sheets on your bed the night of your first shower and do not  sleep with pets. Day of Surgery : Do not apply any lotions/deodorants the morning of surgery.  Please wear clean clothes to the hospital/surgery center.  FAILURE TO FOLLOW THESE INSTRUCTIONS MAY RESULT IN THE CANCELLATION OF YOUR SURGERY PATIENT SIGNATURE_________________________________  NURSE SIGNATURE__________________________________  ________________________________________________________________________

## 2021-12-01 ENCOUNTER — Encounter (HOSPITAL_COMMUNITY): Payer: Self-pay

## 2021-12-01 ENCOUNTER — Ambulatory Visit (HOSPITAL_COMMUNITY): Admit: 2021-12-01 | Payer: Medicare HMO | Admitting: Gastroenterology

## 2021-12-01 SURGERY — COLONOSCOPY WITH PROPOFOL
Anesthesia: Monitor Anesthesia Care

## 2021-12-02 ENCOUNTER — Encounter (HOSPITAL_COMMUNITY)
Admission: RE | Admit: 2021-12-02 | Discharge: 2021-12-02 | Disposition: A | Discharge status: Home / Self Care | Payer: Medicare HMO | Source: Ambulatory Visit | Attending: Surgery | Admitting: Surgery

## 2021-12-02 ENCOUNTER — Other Ambulatory Visit: Payer: Self-pay

## 2021-12-02 ENCOUNTER — Encounter (HOSPITAL_COMMUNITY): Payer: Self-pay

## 2021-12-02 VITALS — BP 123/78 | HR 71 | Temp 99.2°F | Resp 16 | Wt 237.0 lb

## 2021-12-02 DIAGNOSIS — Z01818 Encounter for other preprocedural examination: Secondary | ICD-10-CM | POA: Diagnosis present

## 2021-12-02 DIAGNOSIS — I10 Essential (primary) hypertension: Secondary | ICD-10-CM | POA: Diagnosis not present

## 2021-12-02 DIAGNOSIS — E119 Type 2 diabetes mellitus without complications: Secondary | ICD-10-CM | POA: Insufficient documentation

## 2021-12-02 DIAGNOSIS — K3189 Other diseases of stomach and duodenum: Secondary | ICD-10-CM | POA: Diagnosis not present

## 2021-12-02 DIAGNOSIS — K219 Gastro-esophageal reflux disease without esophagitis: Secondary | ICD-10-CM | POA: Insufficient documentation

## 2021-12-02 DIAGNOSIS — J398 Other specified diseases of upper respiratory tract: Secondary | ICD-10-CM | POA: Diagnosis not present

## 2021-12-02 DIAGNOSIS — J45909 Unspecified asthma, uncomplicated: Secondary | ICD-10-CM | POA: Insufficient documentation

## 2021-12-02 HISTORY — DX: Acquired absence of right leg below knee: Z89.511

## 2021-12-02 HISTORY — DX: Malignant (primary) neoplasm, unspecified: C80.1

## 2021-12-02 HISTORY — DX: Other pulmonary collapse: J98.19

## 2021-12-02 LAB — CBC
HCT: 44.3 % (ref 36.0–46.0)
Hemoglobin: 14.3 g/dL (ref 12.0–15.0)
MCH: 29.9 pg (ref 26.0–34.0)
MCHC: 32.3 g/dL (ref 30.0–36.0)
MCV: 92.5 fL (ref 80.0–100.0)
Platelets: 256 10*3/uL (ref 150–400)
RBC: 4.79 MIL/uL (ref 3.87–5.11)
RDW: 14.6 % (ref 11.5–15.5)
WBC: 6.5 10*3/uL (ref 4.0–10.5)
nRBC: 0 % (ref 0.0–0.2)

## 2021-12-02 LAB — BASIC METABOLIC PANEL
Anion gap: 8 (ref 5–15)
BUN: 14 mg/dL (ref 6–20)
CO2: 33 mmol/L — ABNORMAL HIGH (ref 22–32)
Calcium: 9.5 mg/dL (ref 8.9–10.3)
Chloride: 101 mmol/L (ref 98–111)
Creatinine, Ser: 0.87 mg/dL (ref 0.44–1.00)
GFR, Estimated: >60 mL/min (ref >60)
Glucose, Bld: 128 mg/dL — ABNORMAL HIGH (ref 70–99)
Potassium: 4.7 mmol/L (ref 3.5–5.1)
Sodium: 142 mmol/L (ref 135–145)

## 2021-12-02 LAB — HEMOGLOBIN A1C
Hgb A1c MFr Bld: 6.3 % — ABNORMAL HIGH (ref 4.8–5.6)
Mean Plasma Glucose: 134.11 mg/dL

## 2021-12-02 LAB — GLUCOSE, CAPILLARY: Glucose-Capillary: 126 mg/dL — ABNORMAL HIGH (ref 70–99)
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
## 2021-12-05 ENCOUNTER — Ambulatory Visit (INDEPENDENT_AMBULATORY_CARE_PROVIDER_SITE_OTHER): Payer: Medicare HMO

## 2021-12-05 DIAGNOSIS — J309 Allergic rhinitis, unspecified: Secondary | ICD-10-CM | POA: Diagnosis not present

## 2021-12-08 ENCOUNTER — Encounter (HOSPITAL_COMMUNITY)
Admission: RE | Admit: 2021-12-08 | Discharge: 2021-12-08 | Disposition: A | Discharge status: Home / Self Care | Payer: Medicare HMO | Source: Ambulatory Visit | Attending: Surgery | Admitting: Surgery

## 2021-12-08 ENCOUNTER — Other Ambulatory Visit: Payer: Self-pay

## 2021-12-08 DIAGNOSIS — Z01812 Encounter for preprocedural laboratory examination: Secondary | ICD-10-CM | POA: Insufficient documentation

## 2021-12-08 DIAGNOSIS — Z20822 Contact with and (suspected) exposure to covid-19: Secondary | ICD-10-CM | POA: Insufficient documentation

## 2021-12-08 LAB — SARS CORONAVIRUS 2 (TAT 6-24 HRS): SARS Coronavirus 2: NEGATIVE

## 2021-12-09 NOTE — Anesthesia Preprocedure Evaluation (Addendum)
Anesthesia Evaluation  Patient identified by MRN, date of birth, ID band Patient awake    Reviewed: Allergy & Precautions, H&P , NPO status , Patient's Chart, lab work & pertinent test results, reviewed documented beta blocker date and time   History of Anesthesia Complications (+) PONV, DIFFICULT AIRWAY and history of anesthetic complications  Airway Mallampati: II  TM Distance: >3 FB Neck ROM: Full    Dental no notable dental hx. (+) Teeth Intact, Dental Advisory Given   Pulmonary asthma ,    Pulmonary exam normal breath sounds clear to auscultation       Cardiovascular Exercise Tolerance: Good hypertension, Pt. on medications and Pt. on home beta blockers  Rhythm:Regular Rate:Normal     Neuro/Psych Depression negative neurological ROS     GI/Hepatic Neg liver ROS, GERD  Medicated,  Endo/Other  diabetes, Type 2, Oral Hypoglycemic AgentsMorbid obesity  Renal/GU negative Renal ROS  negative genitourinary   Musculoskeletal  (+) Arthritis , Osteoarthritis,    Abdominal   Peds  Hematology  (+) Blood dyscrasia, anemia ,   Anesthesia Other Findings   Reproductive/Obstetrics negative OB ROS                            Anesthesia Physical Anesthesia Plan  ASA: 3  Anesthesia Plan: General   Post-op Pain Management: Ofirmev IV (intra-op)*   Induction: Intravenous  PONV Risk Score and Plan: 4 or greater and Ondansetron, Dexamethasone and Midazolam  Airway Management Planned: Oral ETT and Video Laryngoscope Planned  Additional Equipment:   Intra-op Plan:   Post-operative Plan: Extubation in OR  Informed Consent: I have reviewed the patients History and Physical, chart, labs and discussed the procedure including the risks, benefits and alternatives for the proposed anesthesia with the patient or authorized representative who has indicated his/her understanding and acceptance.      Dental advisory given  Plan Discussed with: CRNA  Anesthesia Plan Comments:        Anesthesia Quick Evaluation

## 2021-12-10 ENCOUNTER — Encounter (HOSPITAL_COMMUNITY): Payer: Self-pay | Admitting: Surgery

## 2021-12-10 ENCOUNTER — Encounter (HOSPITAL_COMMUNITY)
Admission: RE | Disposition: A | Discharge status: Home / Self Care | Payer: Self-pay | Source: Ambulatory Visit | Attending: Surgery

## 2021-12-10 ENCOUNTER — Other Ambulatory Visit: Payer: Self-pay

## 2021-12-10 ENCOUNTER — Inpatient Hospital Stay (HOSPITAL_COMMUNITY): Payer: Medicare HMO | Admitting: Physician Assistant

## 2021-12-10 ENCOUNTER — Inpatient Hospital Stay (HOSPITAL_COMMUNITY): Payer: Medicare HMO | Admitting: Anesthesiology

## 2021-12-10 ENCOUNTER — Inpatient Hospital Stay (HOSPITAL_COMMUNITY)
Admission: RE | Admit: 2021-12-10 | Discharge: 2021-12-11 | DRG: 828 | Disposition: A | Discharge status: Home / Self Care | Payer: Medicare HMO | Source: Ambulatory Visit | Attending: Surgery | Admitting: Surgery

## 2021-12-10 DIAGNOSIS — Z7951 Long term (current) use of inhaled steroids: Secondary | ICD-10-CM | POA: Diagnosis not present

## 2021-12-10 DIAGNOSIS — Z880 Allergy status to penicillin: Secondary | ICD-10-CM

## 2021-12-10 DIAGNOSIS — E785 Hyperlipidemia, unspecified: Secondary | ICD-10-CM | POA: Diagnosis present

## 2021-12-10 DIAGNOSIS — C493 Malignant neoplasm of connective and soft tissue of thorax: Secondary | ICD-10-CM

## 2021-12-10 DIAGNOSIS — E119 Type 2 diabetes mellitus without complications: Secondary | ICD-10-CM | POA: Diagnosis present

## 2021-12-10 DIAGNOSIS — Z91030 Bee allergy status: Secondary | ICD-10-CM

## 2021-12-10 DIAGNOSIS — K219 Gastro-esophageal reflux disease without esophagitis: Secondary | ICD-10-CM | POA: Diagnosis present

## 2021-12-10 DIAGNOSIS — Z833 Family history of diabetes mellitus: Secondary | ICD-10-CM

## 2021-12-10 DIAGNOSIS — Z91041 Radiographic dye allergy status: Secondary | ICD-10-CM

## 2021-12-10 DIAGNOSIS — I10 Essential (primary) hypertension: Secondary | ICD-10-CM | POA: Diagnosis present

## 2021-12-10 DIAGNOSIS — Z86718 Personal history of other venous thrombosis and embolism: Secondary | ICD-10-CM | POA: Diagnosis not present

## 2021-12-10 DIAGNOSIS — Z79899 Other long term (current) drug therapy: Secondary | ICD-10-CM

## 2021-12-10 DIAGNOSIS — K3189 Other diseases of stomach and duodenum: Secondary | ICD-10-CM | POA: Diagnosis not present

## 2021-12-10 DIAGNOSIS — C801 Malignant (primary) neoplasm, unspecified: Secondary | ICD-10-CM | POA: Diagnosis present

## 2021-12-10 DIAGNOSIS — J45909 Unspecified asthma, uncomplicated: Secondary | ICD-10-CM | POA: Diagnosis present

## 2021-12-10 DIAGNOSIS — Z8249 Family history of ischemic heart disease and other diseases of the circulatory system: Secondary | ICD-10-CM

## 2021-12-10 DIAGNOSIS — K319 Disease of stomach and duodenum, unspecified: Secondary | ICD-10-CM | POA: Diagnosis present

## 2021-12-10 DIAGNOSIS — Z20822 Contact with and (suspected) exposure to covid-19: Secondary | ICD-10-CM | POA: Diagnosis present

## 2021-12-10 DIAGNOSIS — Z882 Allergy status to sulfonamides status: Secondary | ICD-10-CM

## 2021-12-10 DIAGNOSIS — C7989 Secondary malignant neoplasm of other specified sites: Principal | ICD-10-CM | POA: Diagnosis present

## 2021-12-10 HISTORY — PX: LAPAROSCOPIC GASTRIC RESECTION: SHX1936

## 2021-12-10 LAB — GLUCOSE, CAPILLARY
Glucose-Capillary: 122 mg/dL — ABNORMAL HIGH (ref 70–99)
Glucose-Capillary: 171 mg/dL — ABNORMAL HIGH (ref 70–99)

## 2021-12-10 SURGERY — LAPAROSCOPIC GASTRIC RESECTION
Anesthesia: General

## 2021-12-10 MED ORDER — ACETAMINOPHEN 500 MG PO TABS
1000.0000 mg | ORAL_TABLET | ORAL | Status: AC
  Administered 2021-12-10: 1000 mg via ORAL

## 2021-12-10 MED ORDER — DIPHENHYDRAMINE HCL 50 MG/ML IJ SOLN: 25.0000 mg | Freq: Four times a day (QID) | INTRAMUSCULAR | Status: DC | PRN

## 2021-12-10 MED ORDER — EZETIMIBE-SIMVASTATIN 10-40 MG PO TABS
1.0000 | ORAL_TABLET | Freq: Every day | ORAL | Status: DC
  Filled 2021-12-10: qty 1

## 2021-12-10 MED ORDER — ARFORMOTEROL TARTRATE 15 MCG/2ML IN NEBU
15.0000 ug | INHALATION_SOLUTION | Freq: Two times a day (BID) | RESPIRATORY_TRACT | Status: DC
  Filled 2021-12-10 (×2): qty 2

## 2021-12-10 MED ORDER — ONDANSETRON HCL 4 MG/2ML IJ SOLN
4.0000 mg | Freq: Four times a day (QID) | INTRAMUSCULAR | Status: DC | PRN
Start: 2021-12-10 — End: 2021-12-11

## 2021-12-10 MED ORDER — ACETAMINOPHEN 500 MG PO TABS
1000.0000 mg | ORAL_TABLET | Freq: Four times a day (QID) | ORAL | Status: DC
  Administered 2021-12-10 – 2021-12-11 (×3): 1000 mg via ORAL

## 2021-12-10 MED ORDER — OXYCODONE HCL 5 MG PO TABS
5.0000 mg | ORAL_TABLET | Freq: Once | ORAL | Status: AC
  Administered 2021-12-10: 5 mg via ORAL

## 2021-12-10 MED ORDER — EPHEDRINE SULFATE-NACL 50-0.9 MG/10ML-% IV SOSY
PREFILLED_SYRINGE | INTRAVENOUS | Status: DC | PRN
Start: 2021-12-10 — End: 2021-12-10
  Administered 2021-12-10 (×2): 10 mg via INTRAVENOUS

## 2021-12-10 MED ORDER — KETAMINE HCL 10 MG/ML IJ SOLN
INTRAMUSCULAR | Status: DC | PRN
  Administered 2021-12-10: 10 mg via INTRAVENOUS

## 2021-12-10 MED ORDER — PANTOPRAZOLE SODIUM 40 MG PO TBEC
40.0000 mg | DELAYED_RELEASE_TABLET | Freq: Every day | ORAL | Status: DC
Start: 2021-12-10 — End: 2021-12-11
  Administered 2021-12-10 – 2021-12-11 (×2): 40 mg via ORAL

## 2021-12-10 MED ORDER — MONTELUKAST SODIUM 10 MG PO TABS
10.0000 mg | ORAL_TABLET | Freq: Every day | ORAL | Status: DC
  Administered 2021-12-10: 10 mg via ORAL

## 2021-12-10 MED ORDER — ENSURE PRE-SURGERY PO LIQD
592.0000 mL | Freq: Once | ORAL | Status: AC
  Filled 2021-12-10: qty 592

## 2021-12-10 MED ORDER — OXYBUTYNIN CHLORIDE ER 5 MG PO TB24
5.0000 mg | ORAL_TABLET | Freq: Every day | ORAL | Status: DC
Start: 2021-12-10 — End: 2021-12-11
  Administered 2021-12-10: 5 mg via ORAL

## 2021-12-10 MED ORDER — LIDOCAINE 2% (20 MG/ML) 5 ML SYRINGE
INTRAMUSCULAR | Status: DC | PRN
  Administered 2021-12-10: 50 mg via INTRAVENOUS

## 2021-12-10 MED ORDER — METRONIDAZOLE 500 MG/100ML IV SOLN
500.0000 mg | INTRAVENOUS | Status: AC
  Administered 2021-12-10: 500 mg via INTRAVENOUS

## 2021-12-10 MED ORDER — FENTANYL CITRATE (PF) 100 MCG/2ML IJ SOLN
INTRAMUSCULAR | Status: DC | PRN
  Administered 2021-12-10: 100 ug via INTRAVENOUS

## 2021-12-10 MED ORDER — ROCURONIUM BROMIDE 10 MG/ML (PF) SYRINGE
PREFILLED_SYRINGE | INTRAVENOUS | Status: DC | PRN
  Administered 2021-12-10: 60 mg via INTRAVENOUS

## 2021-12-10 MED ORDER — ATENOLOL 25 MG PO TABS
25.0000 mg | ORAL_TABLET | Freq: Every day | ORAL | Status: DC
  Administered 2021-12-11: 25 mg via ORAL

## 2021-12-10 MED ORDER — TAPENTADOL HCL ER 100 MG PO TB12
100.0000 mg | ORAL_TABLET | Freq: Two times a day (BID) | ORAL | Status: DC
  Administered 2021-12-10 – 2021-12-11 (×2): 100 mg via ORAL

## 2021-12-10 MED ORDER — EZETIMIBE 10 MG PO TABS
10.0000 mg | ORAL_TABLET | Freq: Every day | ORAL | Status: DC
  Administered 2021-12-10: 10 mg via ORAL

## 2021-12-10 MED ORDER — UMECLIDINIUM BROMIDE 62.5 MCG/ACT IN AEPB
1.0000 | INHALATION_SPRAY | Freq: Every day | RESPIRATORY_TRACT | Status: DC
  Filled 2021-12-10: qty 7

## 2021-12-10 MED ORDER — HYDROMORPHONE HCL 1 MG/ML IJ SOLN: 0.2500 mg | INTRAMUSCULAR | Status: DC | PRN

## 2021-12-10 MED ORDER — GABAPENTIN 300 MG PO CAPS: 300.0000 mg | ORAL_CAPSULE | ORAL | Status: DC

## 2021-12-10 MED ORDER — ORAL CARE MOUTH RINSE: 15.0000 mL | Freq: Once | OROMUCOSAL | Status: AC

## 2021-12-10 MED ORDER — ONDANSETRON HCL 4 MG/2ML IJ SOLN
INTRAMUSCULAR | Status: DC | PRN
  Administered 2021-12-10: 4 mg via INTRAVENOUS

## 2021-12-10 MED ORDER — SERTRALINE HCL 100 MG PO TABS
200.0000 mg | ORAL_TABLET | Freq: Every day | ORAL | Status: DC
Start: 2021-12-11 — End: 2021-12-11
  Administered 2021-12-11: 200 mg via ORAL

## 2021-12-10 MED ORDER — BUDESONIDE 0.5 MG/2ML IN SUSP: 0.5000 mg | Freq: Two times a day (BID) | RESPIRATORY_TRACT | Status: DC

## 2021-12-10 MED ORDER — ENSURE PRE-SURGERY PO LIQD
296.0000 mL | Freq: Once | ORAL | Status: DC
  Filled 2021-12-10: qty 296

## 2021-12-10 MED ORDER — TRAMADOL HCL 50 MG PO TABS
50.0000 mg | ORAL_TABLET | Freq: Four times a day (QID) | ORAL | Status: DC | PRN
  Administered 2021-12-10 (×2): 50 mg via ORAL

## 2021-12-10 MED ORDER — CEFAZOLIN SODIUM-DEXTROSE 2-4 GM/100ML-% IV SOLN
2.0000 g | INTRAVENOUS | Status: AC
  Administered 2021-12-10: 2 g via INTRAVENOUS

## 2021-12-10 MED ORDER — LACTATED RINGERS IV SOLN: INTRAVENOUS | Status: DC

## 2021-12-10 MED ORDER — BUDESON-GLYCOPYRROL-FORMOTEROL 160-9-4.8 MCG/ACT IN AERO: 2.0000 | INHALATION_SPRAY | Freq: Two times a day (BID) | RESPIRATORY_TRACT | Status: DC

## 2021-12-10 MED ORDER — ONDANSETRON 4 MG PO TBDP: 4.0000 mg | ORAL_TABLET | Freq: Four times a day (QID) | ORAL | Status: DC | PRN

## 2021-12-10 MED ORDER — SUGAMMADEX SODIUM 200 MG/2ML IV SOLN
INTRAVENOUS | Status: DC | PRN
  Administered 2021-12-10: 200 mg via INTRAVENOUS

## 2021-12-10 MED ORDER — 0.9 % SODIUM CHLORIDE (POUR BTL) OPTIME
TOPICAL | Status: DC | PRN
  Administered 2021-12-10: 1000 mL

## 2021-12-10 MED ORDER — BUPIVACAINE-EPINEPHRINE 0.25% -1:200000 IJ SOLN
INTRAMUSCULAR | Status: DC | PRN
  Administered 2021-12-10: 30 mL

## 2021-12-10 MED ORDER — SCOPOLAMINE 1 MG/3DAYS TD PT72
1.0000 | MEDICATED_PATCH | TRANSDERMAL | Status: DC
  Administered 2021-12-10: 1.5 mg via TRANSDERMAL

## 2021-12-10 MED ORDER — PROPOFOL 10 MG/ML IV BOLUS
INTRAVENOUS | Status: DC | PRN
  Administered 2021-12-10: 200 mg via INTRAVENOUS

## 2021-12-10 MED ORDER — TRAZODONE HCL 100 MG PO TABS: 100.0000 mg | ORAL_TABLET | Freq: Every evening | ORAL | Status: DC | PRN

## 2021-12-10 MED ORDER — ALBUTEROL SULFATE (2.5 MG/3ML) 0.083% IN NEBU: 2.5000 mg | INHALATION_SOLUTION | RESPIRATORY_TRACT | Status: DC | PRN

## 2021-12-10 MED ORDER — MIDAZOLAM HCL 5 MG/5ML IJ SOLN
INTRAMUSCULAR | Status: DC | PRN
  Administered 2021-12-10: 2 mg via INTRAVENOUS

## 2021-12-10 MED ORDER — LACTATED RINGERS IR SOLN
Status: DC | PRN
  Administered 2021-12-10: 1000 mL

## 2021-12-10 MED ORDER — GABAPENTIN 400 MG PO CAPS
800.0000 mg | ORAL_CAPSULE | Freq: Two times a day (BID) | ORAL | Status: DC
  Administered 2021-12-10 – 2021-12-11 (×2): 800 mg via ORAL

## 2021-12-10 MED ORDER — DIPHENHYDRAMINE HCL 25 MG PO CAPS: 25.0000 mg | ORAL_CAPSULE | Freq: Four times a day (QID) | ORAL | Status: DC | PRN

## 2021-12-10 MED ORDER — SIMVASTATIN 20 MG PO TABS
40.0000 mg | ORAL_TABLET | Freq: Every day | ORAL | Status: DC
  Administered 2021-12-10: 40 mg via ORAL

## 2021-12-10 MED ORDER — KETOROLAC TROMETHAMINE 15 MG/ML IJ SOLN
15.0000 mg | Freq: Three times a day (TID) | INTRAMUSCULAR | Status: DC
  Administered 2021-12-10 – 2021-12-11 (×3): 15 mg via INTRAVENOUS

## 2021-12-10 MED ORDER — PHENYLEPHRINE 40 MCG/ML (10ML) SYRINGE FOR IV PUSH (FOR BLOOD PRESSURE SUPPORT)
PREFILLED_SYRINGE | INTRAVENOUS | Status: DC | PRN
  Administered 2021-12-10: 80 ug via INTRAVENOUS

## 2021-12-10 MED ORDER — CHLORHEXIDINE GLUCONATE 0.12 % MT SOLN
15.0000 mL | Freq: Once | OROMUCOSAL | Status: AC
  Administered 2021-12-10: 15 mL via OROMUCOSAL

## 2021-12-10 MED ORDER — DEXAMETHASONE SODIUM PHOSPHATE 10 MG/ML IJ SOLN
INTRAMUSCULAR | Status: DC | PRN
Start: 2021-12-10 — End: 2021-12-10
  Administered 2021-12-10: 10 mg via INTRAVENOUS

## 2021-12-10 MED ORDER — ENOXAPARIN SODIUM 40 MG/0.4ML IJ SOSY
40.0000 mg | PREFILLED_SYRINGE | INTRAMUSCULAR | Status: DC
  Administered 2021-12-11: 40 mg via SUBCUTANEOUS

## 2021-12-10 MED ORDER — SUCRALFATE 1 GM/10ML PO SUSP
1.0000 g | Freq: Two times a day (BID) | ORAL | Status: DC
  Administered 2021-12-10 – 2021-12-11 (×3): 1 g via ORAL

## 2021-12-10 MED ORDER — DOCUSATE SODIUM 100 MG PO CAPS
100.0000 mg | ORAL_CAPSULE | Freq: Two times a day (BID) | ORAL | Status: DC
  Administered 2021-12-10 – 2021-12-11 (×2): 100 mg via ORAL

## 2021-12-10 SURGICAL SUPPLY — 48 items
APPLIER CLIP 5 13 M/L LIGAMAX5 (MISCELLANEOUS)
BAG COUNTER SPONGE SURGICOUNT (BAG) ×1 IMPLANT
BLADE CLIPPER SURG (BLADE) IMPLANT
BLADE SURG SZ10 CARB STEEL (BLADE) ×2 IMPLANT
CANISTER SUCT 3000ML PPV (MISCELLANEOUS) ×1 IMPLANT
CHLORAPREP W/TINT 26 (MISCELLANEOUS) ×2 IMPLANT
CLIP APPLIE 5 13 M/L LIGAMAX5 (MISCELLANEOUS) IMPLANT
COVER SURGICAL LIGHT HANDLE (MISCELLANEOUS) ×3 IMPLANT
DERMABOND ADVANCED (GAUZE/BANDAGES/DRESSINGS) ×1
DERMABOND ADVANCED .7 DNX12 (GAUZE/BANDAGES/DRESSINGS) ×1 IMPLANT
DRAPE WARM FLUID 44X44 (DRAPES) ×1 IMPLANT
ELECT PENCIL ROCKER SW 15FT (MISCELLANEOUS) ×2 IMPLANT
ELECT REM PT RETURN 15FT ADLT (MISCELLANEOUS) ×2 IMPLANT
GLOVE SURG ENC MOIS LTX SZ6 (GLOVE) ×2 IMPLANT
GLOVE SURG UNDER LTX SZ6.5 (GLOVE) ×7 IMPLANT
GLOVE SURG UNDER POLY LF SZ6 (GLOVE) ×2 IMPLANT
GOWN STRL REUS W/ TWL LRG LVL3 (GOWN DISPOSABLE) ×3 IMPLANT
GOWN STRL REUS W/TWL LRG LVL3 (GOWN DISPOSABLE) ×4
GRASPER SUT TROCAR 14GX15 (MISCELLANEOUS) ×2 IMPLANT
IRRIG SUCT STRYKERFLOW 2 WTIP (MISCELLANEOUS) ×2
IRRIGATION SUCT STRKRFLW 2 WTP (MISCELLANEOUS) ×1 IMPLANT
KIT BASIN OR (CUSTOM PROCEDURE TRAY) ×2 IMPLANT
L-HOOK LAP DISP 36CM (ELECTROSURGICAL)
LHOOK LAP DISP 36CM (ELECTROSURGICAL) IMPLANT
NDL INSUFFLATION 14GA 120MM (NEEDLE) ×1 IMPLANT
NEEDLE INSUFFLATION 14GA 120MM (NEEDLE) ×2 IMPLANT
NS IRRIG 1000ML POUR BTL (IV SOLUTION) ×3 IMPLANT
PAD ARMBOARD 7.5X6 YLW CONV (MISCELLANEOUS) ×2 IMPLANT
POUCH SPECIMEN RETRIEVAL 10MM (ENDOMECHANICALS) ×2 IMPLANT
RELOAD STAPLE 60 3.6 BLU REG (STAPLE) IMPLANT
RELOAD STAPLER BLUE 60MM (STAPLE) ×3 IMPLANT
SCISSORS LAP 5X35 DISP (ENDOMECHANICALS) ×2 IMPLANT
SET TUBE SMOKE EVAC HIGH FLOW (TUBING) ×2 IMPLANT
SHEARS HARMONIC ACE PLUS 36CM (ENDOMECHANICALS) ×2 IMPLANT
SLEEVE ENDOPATH XCEL 5M (ENDOMECHANICALS) ×6 IMPLANT
STAPLE ECHEON FLEX 60 POW ENDO (STAPLE) ×1 IMPLANT
STAPLER RELOAD BLUE 60MM (STAPLE) ×6
SUT MNCRL AB 4-0 PS2 18 (SUTURE) ×2 IMPLANT
SUT VICRYL 0 UR6 27IN ABS (SUTURE) ×2 IMPLANT
SYR BULB IRRIG 60ML STRL (SYRINGE) ×2 IMPLANT
TOWEL OR NON WOVEN STRL DISP B (DISPOSABLE) ×3 IMPLANT
TRAY FOLEY MTR SLVR 16FR STAT (SET/KITS/TRAYS/PACK) IMPLANT
TRAY LAPAROSCOPIC (CUSTOM PROCEDURE TRAY) ×2 IMPLANT
TROCAR XCEL 12X100 BLDLESS (ENDOMECHANICALS) ×2 IMPLANT
TROCAR XCEL BLUNT TIP 100MML (ENDOMECHANICALS) ×1 IMPLANT
TROCAR XCEL NON-BLD 11X100MML (ENDOMECHANICALS) ×1 IMPLANT
TROCAR XCEL NON-BLD 5MMX100MML (ENDOMECHANICALS) ×2 IMPLANT
WATER STERILE IRR 1000ML POUR (IV SOLUTION) ×2 IMPLANT

## 2021-12-10 NOTE — Transfer of Care (Signed)
Immediate Anesthesia Transfer of Care Note  Patient: Joanna [name]  Procedure(s) Performed: Procedure(s): LAPAROSCOPIC GASTRIC WEDGE RESECTION (N/A)  Patient Location: PACU  Anesthesia Type:General  Level of Consciousness: Alert, Awake, Oriented  Airway & Oxygen Therapy: Patient Spontanous Breathing  Post-op Assessment: Report given to RN  Post vital signs: Reviewed and stable  Last Vitals:  Vitals:   12/10/21 0716  BP: 133/81  Pulse: 74  Resp: 16  Temp: 37.2 C  SpO2: 65%    Complications: No apparent anesthesia complications

## 2021-12-10 NOTE — Op Note (Signed)
Date: 12/10/21  Patient: Joanna [name] MRN: [id number]  Preoperative Diagnosis: Submucosal greater curve gastric mass Postoperative Diagnosis: Greater curve gastric mass, metastatic adenocarcinoma of unknown primary  Procedure: Diagnostic laparoscopy, biopsy of diaphragmatic nodules, wedge resection of gastric mass  Surgeon: Michaelle Birks, MD Assistant: Stark Klein, MD  EBL: Minimal  Anesthesia: General endotracheal  Specimens: Diaphragmatic nodule Gastric mass  Indications: Ms. Lares is a 56 yo female who underwent a chest CT several months ago for workup of cough and shortness of breath. This incidentally showed a 3.5cm mass on the proximal greater curve of the stomach. EUS showed that the mass appeared to originate from the muscularis propria, consistent with a GIST, however FNA showed neoplastic cells but was not diagnostic. The decision was made to proceed with excision of the mass.  Findings: Multiple malignant appearing nodules on the surface of the right hemidiaphragm. Biopsies with frozen section confirmed metastatic adenocarcinoma. There was no other evidence of metastatic disease within the abdomen. Exophytic well-circumscribed mass on the proximal greater curve of the stomach was excised via a wedge resection.  Procedure details: Informed consent was obtained in the preoperative area prior to the procedure. The patient was brought to the operating room and placed on the table in the supine position. General anesthesia was induced and appropriate lines and drains were placed for intraoperative monitoring. Perioperative antibiotics were administered per SCIP guidelines. The abdomen was prepped and draped in the usual sterile fashion. A pre-procedure timeout was taken verifying patient identity, surgical site and procedure to be performed.  A small upper midline skin incision was made, the fascia was grasped and elevated, and a Veress needle was inserted.  Intraperitoneal placement  was confirmed with the saline drop test.  A 5 mm Visiport was placed, and the abdomen was inspected with no signs of visceral or vascular injury.  Multiple malignant appearing nodules were visualized on the right hemidiaphragm over the liver.  There were no other peritoneal nodules noted.  Additional 5 mm ports were placed in the right upper quadrant and the left upper quadrant, and a 12 mm port was placed in the left upper quadrant, all under direct visualization.  The stomach was examined, and an exophytic mass was visible on the proximal greater curve corresponding to the lesion noted on imaging.  It was well-circumscribed and not invading any surrounding structures.  It appeared characteristic of a GIST.  Two of the diaphragmatic nodules were sharply excised and sent for frozen analysis, and this confirmed metastatic adenocarcinoma of unknown primary.  Because the gastric mass was not characteristic of a typical adenocarcinoma and could be easily excised with a wedge resection, the decision was made to proceed with resection of the mass.  The gastrocolic omentum was opened on the proximal greater curve of the stomach using harmonic shears.  The short gastric vessels near the mass were divided with the harmonic, and this completely freed the mass from the omentum.  An Echelon stapler with a blue load was used to staple off the mass from the adjacent stomach.  Three fires of the stapler were used. This did not at all narrow the lumen of the stomach.  The staple line was closely inspected and was hemostatic.  The mass was placed in an Endo Catch bag and removed, and sent for routine pathology.  The abdomen was again visually inspected.  There were no nodules on the surface of the liver.  The patient was placed in Trendelenburg and the pelvis was examined.  Both ovaries appeared grossly normal normal with no masses or cysts, and the uterus was grossly normal in appearance.  The biopsy sites on the diaphragm were  hemostatic.  The 12 mm port site fascia was closed with 0 Vicryl suture using a PMI.  The remaining ports were removed under direct visualization and the pneumoperitoneum was evacuated.  The skin at all port sites was closed with subcuticular 4-0 Monocryl suture, and Dermabond was applied.  The patient tolerated the procedure well with no apparent complications.  All counts were correct x2 at the end of the procedure. The patient was extubated and taken to PACU in stable condition.  Michaelle Birks, MD 12/10/21 10:40 AM

## 2021-12-10 NOTE — Anesthesia Postprocedure Evaluation (Signed)
Anesthesia Post Note  Patient: Joanna [name]  Procedure(s) Performed: LAPAROSCOPIC GASTRIC WEDGE RESECTION     Patient location during evaluation: PACU Anesthesia Type: General Level of consciousness: awake and alert Pain management: pain level controlled Vital Signs Assessment: post-procedure vital signs reviewed and stable Respiratory status: spontaneous breathing, nonlabored ventilation, respiratory function stable and patient connected to nasal cannula oxygen Cardiovascular status: blood pressure returned to baseline and stable Postop Assessment: no apparent nausea or vomiting Anesthetic complications: no   No notable events documented.  Last Vitals:  Vitals:   12/10/21 1115 12/10/21 1130  BP: (!) 141/85   Pulse: 61 65  Resp: 16 14  Temp:    SpO2: 99% 98%    Last Pain:  Vitals:   12/10/21 1115  TempSrc:   PainSc: 0-No pain                 Orva Riles,W. EDMOND

## 2021-12-10 NOTE — Anesthesia Procedure Notes (Signed)
Procedure Name: Intubation Date/Time: 12/10/2021 8:42 AM Performed by: Gerald Leitz, CRNA Pre-anesthesia Checklist: Patient identified, Patient being monitored, Timeout performed, Emergency Drugs available and Suction available Patient Re-evaluated:Patient Re-evaluated prior to induction Oxygen Delivery Method: Circle system utilized Preoxygenation: Pre-oxygenation with 100% oxygen Induction Type: IV induction Ventilation: Mask ventilation without difficulty Laryngoscope Size: Mac, 3 and Glidescope Grade View: Grade I Tube type: Oral Tube size: 7.0 mm Number of attempts: 1 Airway Equipment and Method: Stylet Placement Confirmation: ETT inserted through vocal cords under direct vision, positive ETCO2 and breath sounds checked- equal and bilateral Secured at: 21 cm Tube secured with: Tape Dental Injury: Teeth and Oropharynx as per pre-operative assessment  Comments: ETT passes cords but sharp angled tracheal curve impairs ETT tube from advancing easily. Use Glidescope.

## 2021-12-10 NOTE — Interval H&P Note (Signed)
History and Physical Interval Note:  12/10/2021 7:58 AM  Joanna [name]  has presented today for surgery, with the diagnosis of GASTRIC MASS.  The various methods of treatment have been discussed with the patient and family. After consideration of risks, benefits and other options for treatment, the patient has consented to  Procedure(s): LAPAROSCOPIC GASTRIC WEDGE RESECTION (N/A) as a surgical intervention.  The patient's history has been reviewed, patient examined, no change in status, stable for surgery.  I have reviewed the patient's chart and labs.  Questions were answered to the patient's satisfaction.  Patient will be admitted postoperatively.   Joanna [name]

## 2021-12-11 ENCOUNTER — Other Ambulatory Visit: Payer: Self-pay | Admitting: Surgery

## 2021-12-11 ENCOUNTER — Other Ambulatory Visit (HOSPITAL_COMMUNITY): Payer: Self-pay

## 2021-12-11 ENCOUNTER — Encounter (HOSPITAL_COMMUNITY): Payer: Self-pay | Admitting: Surgery

## 2021-12-11 DIAGNOSIS — C169 Malignant neoplasm of stomach, unspecified: Secondary | ICD-10-CM

## 2021-12-11 LAB — BASIC METABOLIC PANEL
Anion gap: 5 (ref 5–15)
BUN: 15 mg/dL (ref 6–20)
CO2: 30 mmol/L (ref 22–32)
Calcium: 8.6 mg/dL — ABNORMAL LOW (ref 8.9–10.3)
Chloride: 104 mmol/L (ref 98–111)
Creatinine, Ser: 0.94 mg/dL (ref 0.44–1.00)
GFR, Estimated: >60 mL/min (ref >60)
Glucose, Bld: 111 mg/dL — ABNORMAL HIGH (ref 70–99)
Potassium: 3.8 mmol/L (ref 3.5–5.1)
Sodium: 139 mmol/L (ref 135–145)

## 2021-12-11 LAB — CBC
HCT: 36.6 % (ref 36.0–46.0)
Hemoglobin: 11.9 g/dL — ABNORMAL LOW (ref 12.0–15.0)
MCH: 30.3 pg (ref 26.0–34.0)
MCHC: 32.5 g/dL (ref 30.0–36.0)
MCV: 93.1 fL (ref 80.0–100.0)
Platelets: 172 10*3/uL (ref 150–400)
RBC: 3.93 MIL/uL (ref 3.87–5.11)
RDW: 14.6 % (ref 11.5–15.5)
WBC: 6 10*3/uL (ref 4.0–10.5)
nRBC: 0 % (ref 0.0–0.2)

## 2021-12-11 MED ORDER — OXYCODONE HCL 5 MG PO TABS
5.0000 mg | ORAL_TABLET | ORAL | Status: DC | PRN
  Administered 2021-12-11: 5 mg via ORAL

## 2021-12-11 MED ORDER — BUDESON-GLYCOPYRROL-FORMOTEROL 160-9-4.8 MCG/ACT IN AERO: 2.0000 | INHALATION_SPRAY | Freq: Two times a day (BID) | RESPIRATORY_TRACT | Status: DC

## 2021-12-11 MED ORDER — SUCRALFATE 1 G PO TABS
1.0000 g | ORAL_TABLET | Freq: Two times a day (BID) | ORAL | 2 refills | Status: DC
Start: 2021-12-11 — End: 2022-01-01
  Filled 2021-12-11: qty 42, 21d supply, fill #0

## 2021-12-11 MED ORDER — ONDANSETRON 4 MG PO TBDP
4.0000 mg | ORAL_TABLET | Freq: Four times a day (QID) | ORAL | 0 refills | Status: DC | PRN
  Filled 2021-12-11: qty 20, 5d supply, fill #0

## 2021-12-11 MED ORDER — OXYCODONE HCL 5 MG PO TABS
5.0000 mg | ORAL_TABLET | Freq: Four times a day (QID) | ORAL | 0 refills | Status: AC | PRN
  Filled 2021-12-11: qty 15, 4d supply, fill #0

## 2021-12-11 MED ORDER — ACETAMINOPHEN 500 MG PO TABS: 1000.0000 mg | ORAL_TABLET | Freq: Three times a day (TID) | ORAL | 0 refills | Status: DC | PRN

## 2021-12-11 NOTE — Discharge Instructions (Addendum)
[facility] DISCHARGE INSTRUCTIONS  Activity No heavy lifting greater than 15 pounds for 4 weeks after surgery. Ok to shower, but do not bathe or submerge incisions underwater. Do not drive while taking narcotic pain medication.  Wound Care Your incisions are covered with skin glue called Dermabond. This will peel off on its own over time. You may shower and allow warm soapy water to run over your incisions. Gently pat dry. Do not submerge your incision underwater. Monitor your incision for any new redness, tenderness, or drainage.  When to Call us: Fever greater than 100.5 New redness, drainage, or swelling at incision site Severe pain, nausea, or vomiting  Follow-up You have an appointment scheduled with Dr. Zenia Resides on December 31, 2021. This will be at the [facility] office at [address], [place], [place], [name]. Please arrive at least 15 minutes prior to your scheduled appointment time.  For questions or concerns, please call the office at [phone].

## 2021-12-11 NOTE — Progress Notes (Signed)
Transition of Care [facility]) Screening Note  Patient Details  Name: Erika Slaby Date of Birth: 1966-09-05  Transition of Care [facility]) CM/SW Contact:    Sherie Don, LCSW Phone Number: 12/11/2021, 10:33 AM  Transition of Care Department [facility]) has reviewed patient and no TOC needs have been identified at this time. We will continue to monitor patient advancement through interdisciplinary progression rounds. If new patient transition needs arise, please place a TOC consult.

## 2021-12-11 NOTE — Progress Notes (Signed)
Discharge instructions discussed with patient and family, verbalized agreement and understanding 

## 2021-12-11 NOTE — Discharge Summary (Signed)
Physician Discharge Summary   Patient ID: Joanna [name] [id number] 55 y.o. 04-10-1966  Admit date: 12/10/2021  Discharge date and time: 12/11/2021  Admitting Physician: Dwan Bolt, MD   Discharge Physician: Michaelle Birks, MD  Admission Diagnoses: Subepithelial gastric mass [K31.89]  Discharge Diagnoses: Gastric mass, metastatic adenocarcinoma with diaphragmatic implants  Admission Condition: good  Discharged Condition: good  Indication for Admission: Joanna [name] is a 56 yo female who was incidentally found to have a mass on the greater curve of the stomach. EUS showed a 3cm that appeared to originate from the muscularis propria, however FNA was nondiagnostic. The patient agreed to proceed with surgical excision.  Hospital Course: The patient was taken to the OR on 12/10/21 for a laparoscopic gastric wedge resection. Intraoperatively she was found to have malignant-appearing nodules on the diaphragm. Biopsies with frozen section confirmed adenocarcinoma of unknown primary. The gastric tumor was resected. For further details of this procedure please see separately dictated operative note. Postoperatively the patient was admitted to the med-surg floor in stable condition. She was started on a full liquid diet, which she tolerated, and advanced to a soft diet. On the morning of POD1 she was tolerating solid food with no nausea or vomiting, pain was controlled, and she was able to ambulate. She was examined and deemed appropriate for discharge home.  Consults: None  Significant Diagnostic Studies: N/A  Treatments: analgesia: acetaminophen and oxycodone and surgery: Laparoscopic gastric wedge resection, biopsy of diaphragmatic nodules  Discharge Exam: General: resting comfortably, NAD Neuro: alert and oriented, no focal deficits Resp: normal work of breathing on room air Abdomen: soft, nondistended, nontender to palpation. Incisions clean and dry with mild surrounding ecchymoses, no  induration or drainage Extremities: warm and well-perfused   Disposition: Discharge disposition: 01-Home or Self Care       Patient Instructions:  Allergies as of 12/11/2021       Reactions   Bee Venom Anaphylaxis   Shellfish Allergy Anaphylaxis   Topamax [topiramate] Anaphylaxis, Itching, Swelling   Amoxicillin Swelling   Ivp Dye [iodinated Contrast Media] Hives   Penicillins Swelling, Hives, Itching   Sulfa Antibiotics Swelling        Medication List     TAKE these medications    acetaminophen 500 MG tablet Commonly known as: TYLENOL Take 2 tablets (1,000 mg total) by mouth every 8 (eight) hours as needed for mild pain.   albuterol (2.5 MG/3ML) 0.083% nebulizer solution Commonly known as: PROVENTIL Take 3 mLs (2.5 mg total) by nebulization every 4 (four) hours as needed for wheezing or shortness of breath.   ProAir RespiClick 194 (90 Base) MCG/ACT Aepb Generic drug: Albuterol Sulfate Inhale 2 puffs into the lungs every 4 (four) hours as needed.   atenolol 25 MG tablet Commonly known as: TENORMIN Take 25 mg by mouth in the morning.   Benralizumab 30 MG/ML Sosy Inject 1 mL (30 mg total) into the skin every 8 (eight) weeks.   Breztri Aerosphere 160-9-4.8 MCG/ACT Aero Generic drug: Budeson-Glycopyrrol-Formoterol Inhale 2 puffs into the lungs in the morning and at bedtime.   CALCIUM 500+D3 PO Take 1 tablet by mouth in the morning and at bedtime.   celecoxib 200 MG capsule Commonly known as: CELEBREX Take 200 mg by mouth 2 (two) times daily.   dexlansoprazole 60 MG capsule Commonly known as: Dexilant Take 1 capsule (60 mg total) by mouth daily. What changed: when to take this   docusate sodium 100 MG capsule Commonly known as: COLACE Take 100  mg by mouth at bedtime.   doxycycline 20 MG tablet Commonly known as: PERIOSTAT Take 20 mg by mouth 2 (two) times daily.   ezetimibe-simvastatin 10-40 MG tablet Commonly known as: VYTORIN Take 1 tablet by  mouth at bedtime.   ferrous sulfate 325 (65 FE) MG EC tablet Take 1 tablet (325 mg total) by mouth daily with breakfast.   FIBER PO Take 2 tablets by mouth at bedtime.   fluticasone 50 MCG/ACT nasal spray Commonly known as: FLONASE Place 2 sprays into both nostrils daily. What changed: when to take this   furosemide 40 MG tablet Commonly known as: LASIX Take 40 mg by mouth in the morning.   gabapentin 800 MG tablet Commonly known as: NEURONTIN Take 800 mg by mouth 2 (two) times daily.   BLOOD GLUCOSE TEST STRIPS Strp Test once daily DX: E11.9   glucose blood test strip USE ONE STRIP TO CHECK GLUCOSE ONCE DAILY   glucose monitoring kit monitoring kit by Does not apply route.   l-methylfolate-B6-B12 3-35-2 MG Tabs tablet Commonly known as: METANX Take 2 tablets by mouth daily.   Lancets Misc Test daily as needed  Dx 250.00   lisinopril-hydrochlorothiazide 10-12.5 MG tablet Commonly known as: ZESTORETIC Take 1 tablet by mouth in the morning.   metroNIDAZOLE 0.75 % gel Commonly known as: METROGEL Apply 1 application topically at bedtime.   montelukast 10 MG tablet Commonly known as: SINGULAIR TAKE 1 TABLET AT BEDTIME   multivitamin with minerals Tabs tablet Take 1 tablet by mouth daily. Centrum adults 50+   Narcan 4 MG/0.1ML Liqd nasal spray kit Generic drug: naloxone Place 0.4 mg into the nose once.   NON FORMULARY every 30 (thirty) days. Allergy immunotherapy monthly   ondansetron 4 MG disintegrating tablet Commonly known as: ZOFRAN-ODT Take 1 tablet (4 mg total) by mouth every 6 (six) hours as needed for nausea or vomiting.   oxybutynin 5 MG 24 hr tablet Commonly known as: DITROPAN-XL Take 5 mg by mouth in the morning, at noon, and at bedtime.   oxyCODONE 5 MG immediate release tablet Commonly known as: Oxy IR/ROXICODONE Take 1 tablet (5 mg total) by mouth every 6 (six) hours as needed for up to 5 days for severe pain.   Plenvu 140 g Solr Generic  drug: PEG-KCl-NaCl-NaSulf-Na Asc-C Take 1 kit by mouth as directed.   potassium chloride SA 20 MEQ tablet Commonly known as: KLOR-CON M Take 20 mEq by mouth in the morning.   Rhofade 1 % Crea Generic drug: Oxymetazoline HCl Apply 1 application topically every morning.   Rybelsus 3 MG Tabs Generic drug: Semaglutide Take 3 mg by mouth in the morning.   sertraline 100 MG tablet Commonly known as: ZOLOFT Take 200 mg by mouth in the morning.   Spacer/Aero-Holding [employer] 1 Device by Does not apply route as needed.   sucralfate 1 GM/10ML suspension Commonly known as: Carafate Take 10 mLs (1 g total) by mouth 2 (two) times daily.   tapentadol 50 MG tablet Commonly known as: NUCYNTA Take 100 mg by mouth 2 (two) times daily as needed for severe pain.   tapentadol 100 MG 12 hr tablet Commonly known as: NUCYNTA Take 100 mg by mouth every 12 (twelve) hours.   traZODone 100 MG tablet Commonly known as: DESYREL Take 100 mg by mouth at bedtime.       Activity: no heavy lifting for 3 weeks Diet: regular diet Wound Care: keep wound clean and dry  Follow-up with Dr.  Tanishka Drolet on 12/31/21.   Signed: Dwan Bolt 12/11/2021 9:35 AM

## 2021-12-15 ENCOUNTER — Telehealth: Payer: Self-pay | Admitting: Nurse Practitioner

## 2021-12-15 NOTE — Telephone Encounter (Signed)
Scheduled appt per 2/16 referral. Pt is aware of appt date and time. Pt is aware to arrive 15 mins prior to appt time and to bring and updated insurance card. Pt is aware of appt location.   °

## 2021-12-17 ENCOUNTER — Telehealth: Payer: Self-pay

## 2021-12-17 ENCOUNTER — Ambulatory Visit (INDEPENDENT_AMBULATORY_CARE_PROVIDER_SITE_OTHER): Payer: Medicare HMO

## 2021-12-17 DIAGNOSIS — J309 Allergic rhinitis, unspecified: Secondary | ICD-10-CM

## 2021-12-17 NOTE — Telephone Encounter (Signed)
Patient wanted you informed of her recent diagnosis and condition. Please review chart. She would like to speak to you about this. She has an appointment with oncology on 01-01-22.  Thank you.

## 2021-12-18 NOTE — Telephone Encounter (Signed)
I called her and offered her our support and she was very appreciative of you and all the staff.

## 2021-12-23 ENCOUNTER — Other Ambulatory Visit: Payer: Self-pay

## 2021-12-23 ENCOUNTER — Ambulatory Visit (INDEPENDENT_AMBULATORY_CARE_PROVIDER_SITE_OTHER): Payer: Medicare HMO

## 2021-12-23 DIAGNOSIS — J4550 Severe persistent asthma, uncomplicated: Secondary | ICD-10-CM | POA: Diagnosis not present

## 2021-12-25 ENCOUNTER — Ambulatory Visit (INDEPENDENT_AMBULATORY_CARE_PROVIDER_SITE_OTHER): Payer: Medicare HMO

## 2021-12-25 DIAGNOSIS — J309 Allergic rhinitis, unspecified: Secondary | ICD-10-CM

## 2021-12-31 ENCOUNTER — Ambulatory Visit (INDEPENDENT_AMBULATORY_CARE_PROVIDER_SITE_OTHER): Payer: Medicare HMO

## 2021-12-31 ENCOUNTER — Ambulatory Visit: Payer: Self-pay | Admitting: Surgery

## 2021-12-31 DIAGNOSIS — J309 Allergic rhinitis, unspecified: Secondary | ICD-10-CM | POA: Diagnosis not present

## 2021-12-31 NOTE — H&P (View-Only) (Signed)
Joanna [name] is a 56 yo female who presents for postop follow up. She underwent surgery for resection of a gastric mass on the greater curve, which was suspicious for a GIST on preop workup. Intraop, she had malignant appearing nodules on the diaphragm, which were biopsied and confirmed to be adenocarcinoma. The gastric mass was excised via wedge resection and path showed adenocarcinoma. She is scheduled to see Dr. Burr Medico tomorrow, and is here today for postop follow up. She reports decreased appetite but has not had difficulty with eating, and denies nausea or vomiting. She still has occasional pain at her extraction incision with certain movements. She reports that her reflux seems to have improved since surgery. ?  ?Surgical Pathology: ?A.   DIAPHRAGM NODULES, EXCISION:  ?Findings consistent with metastatic adenocarcinoma.  ?  ?B.   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  ?See the following surgical pathology cancer case summary for details.  ?  ?STOMACH, CARCINOMA: Resection  ?  ?Procedure: Laparoscopic gastric wedge resection  ?Tumor Site: Intramural  ?Tumor Size: 3.6 x 3.1 x 2.9 cm  ?Histologic Type: Adenocarcinoma  ?Histologic Grade: Poorly differentiated, G3  ?Tumor Extension: Tumor extends to the serosa.  ?Treatment Effect: No known presurgical therapy  ?Lymphovascular Invasion: Not identified  ?Margins:  ?     Margin Status for Invasive Carcinoma: Stapled margins are negative  ?for carcinoma  ?     Margin Status for Dysplasia: Stapled margins are negative for  ?dysplasia  ?Regional Lymph Nodes: No lymph nodes submitted or found  ?Distant Metastasis:  ?     Distant Site(s) Involved: Not applicable  ?Pathologic Stage Classification (pTNM, AJCC 8th Edition): At least pT 3,  ?pN not assigned as no nodes submitted.  ?   ?Physical Examination: ?  ?Physical Exam ?Vitals reviewed.  ?Constitutional:   ?   General:  She is not in acute distress. ?   Appearance: Normal appearance.  ?HENT:  ?   Head: Normocephalic and atraumatic.  ?Pulmonary:  ?   Effort: Pulmonary effort is normal. No respiratory distress.  ?Abdominal:  ?   General: There is no distension.  ?   Palpations: Abdomen is soft.  ?   Tenderness: There is no abdominal tenderness.  ?   Comments: Incisions are healing well with no erythema or induration. Resolving ecchymosis at LUQ port site.  ?Musculoskeletal:     ?   General: Normal range of motion.  ?   Comments: RLE prosthesis in place.  ?Neurological:  ?   General: No focal deficit present.  ?   Mental Status: She is alert and oriented to person, place, and time.  ?Psychiatric:     ?   Mood and Affect: Mood normal.     ?   Behavior: Behavior normal.     ?   Thought Content: Thought content normal.  ?  ?  ?  ?  ?  ?Assessment and Plan: ?   ?Joanna [name] is a 56 y.o. 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 She is recovering well from surgery with no acute postoperative issues. She will see Dr. Burr Medico tomorrow to discuss chemotherapy, and I will schedule her for port placement tentatively next week. I reviewed the details of this procedure and the benefits and risks, including bleeding, infection and pneumothorax. She agrees to proceed. ? ?  Michaelle Birks, MD ?[facility] ?General, Hepatobiliary and Pancreatic Surgery ?12/31/21 11:35 AM ? ? ?

## 2021-12-31 NOTE — H&P (Signed)
Joanna [name] is a 56 yo female who presents for postop follow up. She underwent surgery for resection of a gastric mass on the greater curve, which was suspicious for a GIST on preop workup. Intraop, she had malignant appearing nodules on the diaphragm, which were biopsied and confirmed to be adenocarcinoma. The gastric mass was excised via wedge resection and path showed adenocarcinoma. She is scheduled to see Dr. Burr Medico tomorrow, and is here today for postop follow up. She reports decreased appetite but has not had difficulty with eating, and denies nausea or vomiting. She still has occasional pain at her extraction incision with certain movements. She reports that her reflux seems to have improved since surgery. ?  ?Surgical Pathology: ?A.   DIAPHRAGM NODULES, EXCISION:  ?Findings consistent with metastatic adenocarcinoma.  ?  ?B.   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  ?See the following surgical pathology cancer case summary for details.  ?  ?STOMACH, CARCINOMA: Resection  ?  ?Procedure: Laparoscopic gastric wedge resection  ?Tumor Site: Intramural  ?Tumor Size: 3.6 x 3.1 x 2.9 cm  ?Histologic Type: Adenocarcinoma  ?Histologic Grade: Poorly differentiated, G3  ?Tumor Extension: Tumor extends to the serosa.  ?Treatment Effect: No known presurgical therapy  ?Lymphovascular Invasion: Not identified  ?Margins:  ?     Margin Status for Invasive Carcinoma: Stapled margins are negative  ?for carcinoma  ?     Margin Status for Dysplasia: Stapled margins are negative for  ?dysplasia  ?Regional Lymph Nodes: No lymph nodes submitted or found  ?Distant Metastasis:  ?     Distant Site(s) Involved: Not applicable  ?Pathologic Stage Classification (pTNM, AJCC 8th Edition): At least pT 3,  ?pN not assigned as no nodes submitted.  ?   ?Physical Examination: ?  ?Physical Exam ?Vitals reviewed.  ?Constitutional:   ?   General:  She is not in acute distress. ?   Appearance: Normal appearance.  ?HENT:  ?   Head: Normocephalic and atraumatic.  ?Pulmonary:  ?   Effort: Pulmonary effort is normal. No respiratory distress.  ?Abdominal:  ?   General: There is no distension.  ?   Palpations: Abdomen is soft.  ?   Tenderness: There is no abdominal tenderness.  ?   Comments: Incisions are healing well with no erythema or induration. Resolving ecchymosis at LUQ port site.  ?Musculoskeletal:     ?   General: Normal range of motion.  ?   Comments: RLE prosthesis in place.  ?Neurological:  ?   General: No focal deficit present.  ?   Mental Status: She is alert and oriented to person, place, and time.  ?Psychiatric:     ?   Mood and Affect: Mood normal.     ?   Behavior: Behavior normal.     ?   Thought Content: Thought content normal.  ?  ?  ?  ?  ?  ?Assessment and Plan: ?   ?Joanna [name] is a 56 y.o. 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 She is recovering well from surgery with no acute postoperative issues. She will see Dr. Burr Medico tomorrow to discuss chemotherapy, and I will schedule her for port placement tentatively next week. I reviewed the details of this procedure and the benefits and risks, including bleeding, infection and pneumothorax. She agrees to proceed. ? ?  Michaelle Birks, MD ?[facility] ?General, Hepatobiliary and Pancreatic Surgery ?12/31/21 11:35 AM ? ? ?

## 2022-01-01 ENCOUNTER — Other Ambulatory Visit: Payer: Self-pay

## 2022-01-01 ENCOUNTER — Inpatient Hospital Stay: Payer: Medicare HMO

## 2022-01-01 ENCOUNTER — Inpatient Hospital Stay: Payer: Medicare HMO | Attending: Nurse Practitioner | Admitting: Nurse Practitioner

## 2022-01-01 VITALS — BP 134/100 | HR 65 | Temp 97.0°F | Resp 16 | Ht 63.0 in | Wt 238.4 lb

## 2022-01-01 DIAGNOSIS — R634 Abnormal weight loss: Secondary | ICD-10-CM | POA: Insufficient documentation

## 2022-01-01 DIAGNOSIS — C169 Malignant neoplasm of stomach, unspecified: Secondary | ICD-10-CM

## 2022-01-01 DIAGNOSIS — E119 Type 2 diabetes mellitus without complications: Secondary | ICD-10-CM | POA: Insufficient documentation

## 2022-01-01 DIAGNOSIS — C786 Secondary malignant neoplasm of retroperitoneum and peritoneum: Secondary | ICD-10-CM | POA: Diagnosis not present

## 2022-01-01 DIAGNOSIS — I10 Essential (primary) hypertension: Secondary | ICD-10-CM | POA: Diagnosis not present

## 2022-01-01 DIAGNOSIS — K219 Gastro-esophageal reflux disease without esophagitis: Secondary | ICD-10-CM | POA: Diagnosis not present

## 2022-01-01 LAB — CMP (CANCER CENTER ONLY)
ALT: 19 U/L (ref 0–44)
AST: 24 U/L (ref 15–41)
Albumin: 4.4 g/dL (ref 3.5–5.0)
Alkaline Phosphatase: 73 U/L (ref 38–126)
Anion gap: 9 (ref 5–15)
BUN: 15 mg/dL (ref 6–20)
CO2: 32 mmol/L (ref 22–32)
Calcium: 9.6 mg/dL (ref 8.9–10.3)
Chloride: 101 mmol/L (ref 98–111)
Creatinine: 0.93 mg/dL (ref 0.44–1.00)
GFR, Estimated: >60 mL/min (ref >60)
Glucose, Bld: 117 mg/dL — ABNORMAL HIGH (ref 70–99)
Potassium: 3.7 mmol/L (ref 3.5–5.1)
Sodium: 142 mmol/L (ref 135–145)
Total Bilirubin: 0.3 mg/dL (ref 0.3–1.2)
Total Protein: 7.3 g/dL (ref 6.5–8.1)

## 2022-01-01 LAB — FERRITIN: Ferritin: 41 ng/mL (ref 11–307)

## 2022-01-01 LAB — CBC WITH DIFFERENTIAL (CANCER CENTER ONLY)
Abs Immature Granulocytes: 0.02 10*3/uL (ref 0.00–0.07)
Basophils Absolute: 0 10*3/uL (ref 0.0–0.1)
Basophils Relative: 0 %
Eosinophils Absolute: 0 10*3/uL (ref 0.0–0.5)
Eosinophils Relative: 0 %
HCT: 42.5 % (ref 36.0–46.0)
Hemoglobin: 14.2 g/dL (ref 12.0–15.0)
Immature Granulocytes: 0 %
Lymphocytes Relative: 15 %
Lymphs Abs: 0.9 10*3/uL (ref 0.7–4.0)
MCH: 30.1 pg (ref 26.0–34.0)
MCHC: 33.4 g/dL (ref 30.0–36.0)
MCV: 90.2 fL (ref 80.0–100.0)
Monocytes Absolute: 0.4 10*3/uL (ref 0.1–1.0)
Monocytes Relative: 7 %
Neutro Abs: 4.9 10*3/uL (ref 1.7–7.7)
Neutrophils Relative %: 78 %
Platelet Count: 221 10*3/uL (ref 150–400)
RBC: 4.71 MIL/uL (ref 3.87–5.11)
RDW: 13.7 % (ref 11.5–15.5)
WBC Count: 6.2 10*3/uL (ref 4.0–10.5)
nRBC: 0 % (ref 0.0–0.2)

## 2022-01-01 LAB — TSH: TSH: 2.241 u[IU]/mL (ref 0.308–3.960)

## 2022-01-01 LAB — IRON AND IRON BINDING CAPACITY (CC-WL,HP ONLY)
Iron: 125 ug/dL (ref 28–170)
Saturation Ratios: 29 % (ref 10.4–31.8)
TIBC: 430 ug/dL (ref 250–450)
UIBC: 305 ug/dL (ref 148–442)

## 2022-01-01 LAB — CEA (IN HOUSE-CHCC): CEA (CHCC-In House): 2.62 ng/mL (ref 0.00–5.00)
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
## 2022-01-01 NOTE — Progress Notes (Signed)
I met with Joanna [name] and her husband during  her consultation with Cira Rue, NP and Dr Burr Medico.  I explained my role as a nurse navigator and provided my contact information. ?I explained the services provided at [facility] and provided written information.  I explained the alight grant and let  her know one of the financial advisors will reach out to  her at the time of her chemo education class.  I briefly explained insertion and care of a port a cath.  I showed a sample of the port a cath.    I told her that she will be scheduled for chemotherapy education class prior to receiving chemotherapy.  I told her our schedulers will call her with those appts.  All questions were answered.  She verbalized understanding. ? ?

## 2022-01-02 ENCOUNTER — Encounter (HOSPITAL_COMMUNITY): Payer: Self-pay | Admitting: Surgery

## 2022-01-02 ENCOUNTER — Other Ambulatory Visit: Payer: Self-pay
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
## 2022-01-02 NOTE — Progress Notes (Addendum)
DUE TO COVID-19 ONLY ONE VISITOR IS ALLOWED TO COME WITH YOU AND STAY IN THE WAITING ROOM ONLY ON THE DAY OF SURGERY. ? ?PCP- Dr. Ward Givens ?Cardiologist- Denies ? ?Chest x-ray- 06/02/21 ?EKG- 12/02/21 ?Stress Test- patient denies ?Cardiac Cath- patient denies ?Echocardiogram- patient denies  ? ?ICD/Pacemaker- patient denies ? ?Sleep Study- patient denies ?CPAP- n/a ? ?Patient has type 2 diabetes and checks her blood sugar 3 times a week. Ranges 110-135 ? ?Do not take Semaglutide (RYBELSUS) the morning of surgery.  ? ?If your blood sugar is less than 70 mg/dL, you will need to treat for low blood sugar.  ? Treat a low blood sugar (less than 70 mg/dL) with 1/2 cup of clear juice (cranberry or apple), 4 glucose tablets, or glucose gel ?Recheck blood sugar in 15 minutes after treatment to make sure it is greater than 70 mg/dL. If your blood sugar is not greater than 70 mg/dL on recheck, call [phone] for further instructions. ? ?Patient denies COVID symptoms or international travel.  ? ?Ambulatory Surgery- No COVID test required. ? ?Anesthesia review- Yes. Sigmund Hazel, PA and Karoline Caldwell, [place] made aware via message.   ? ?Patient verbalized understanding of instructions given by phone.  ? ? ?

## 2022-01-03 ENCOUNTER — Encounter: Payer: Self-pay | Admitting: Nurse Practitioner

## 2022-01-04 ENCOUNTER — Encounter: Payer: Self-pay | Admitting: Hematology

## 2022-01-04 MED ORDER — ONDANSETRON HCL 8 MG PO TABS: 8.0000 mg | ORAL_TABLET | Freq: Two times a day (BID) | ORAL | 1 refills | Status: DC | PRN

## 2022-01-04 MED ORDER — LIDOCAINE-PRILOCAINE 2.5-2.5 % EX CREA: TOPICAL_CREAM | CUTANEOUS | 3 refills | Status: DC

## 2022-01-04 MED ORDER — PROCHLORPERAZINE MALEATE 10 MG PO TABS: 10.0000 mg | ORAL_TABLET | Freq: Four times a day (QID) | ORAL | 1 refills | Status: DC | PRN

## 2022-01-04 NOTE — Progress Notes (Signed)
START ON PATHWAY REGIMEN - Gastroesophageal ? ? ?  A cycle is every 14 days: ?    Nivolumab  ?    Oxaliplatin  ?    Leucovorin  ?    Fluorouracil  ?    Fluorouracil  ? ?**Always confirm dose/schedule in your pharmacy ordering system** ? ?Patient Characteristics: ?Distant Metastases (cM1/pM1) / Locally Recurrent Disease, Adenocarcinoma - Esophageal, GE Junction, and Gastric, First Line, HER2 Negative/Unknown, PD?L1 Expression  Positive?CPS ? 5 ?Histology: Adenocarcinoma ?Disease Classification: Gastric ?Therapeutic Status: Distant Metastases (No Additional Staging) ?Line of Therapy: First Line ?HER2 Status: Negative ?PD-L1 Expression Status: PD-L1 Expression Positive CPS ? 5 ?Intent of Therapy: ?Non-Curative / Palliative Intent, Discussed with Patient ?

## 2022-01-05 ENCOUNTER — Ambulatory Visit (HOSPITAL_COMMUNITY): Payer: Medicare HMO

## 2022-01-05 ENCOUNTER — Ambulatory Visit (HOSPITAL_BASED_OUTPATIENT_CLINIC_OR_DEPARTMENT_OTHER): Payer: Medicare HMO | Admitting: Physician Assistant

## 2022-01-05 ENCOUNTER — Telehealth: Payer: Self-pay | Admitting: *Deleted

## 2022-01-05 ENCOUNTER — Other Ambulatory Visit: Payer: Self-pay

## 2022-01-05 ENCOUNTER — Encounter (HOSPITAL_COMMUNITY)
Admission: RE | Disposition: A | Discharge status: Home / Self Care | Payer: Self-pay | Source: Home / Self Care | Attending: Surgery

## 2022-01-05 ENCOUNTER — Ambulatory Visit (HOSPITAL_COMMUNITY): Payer: Medicare HMO | Admitting: Physician Assistant

## 2022-01-05 ENCOUNTER — Encounter (HOSPITAL_COMMUNITY): Payer: Self-pay | Admitting: Surgery

## 2022-01-05 ENCOUNTER — Ambulatory Visit (HOSPITAL_COMMUNITY)
Admission: RE | Admit: 2022-01-05 | Discharge: 2022-01-05 | Disposition: A | Discharge status: Home / Self Care | Payer: Medicare HMO | Attending: Surgery | Admitting: Surgery

## 2022-01-05 DIAGNOSIS — I10 Essential (primary) hypertension: Secondary | ICD-10-CM

## 2022-01-05 DIAGNOSIS — J45909 Unspecified asthma, uncomplicated: Secondary | ICD-10-CM | POA: Diagnosis not present

## 2022-01-05 DIAGNOSIS — Z89511 Acquired absence of right leg below knee: Secondary | ICD-10-CM | POA: Diagnosis not present

## 2022-01-05 DIAGNOSIS — F32A Depression, unspecified: Secondary | ICD-10-CM | POA: Insufficient documentation

## 2022-01-05 DIAGNOSIS — E119 Type 2 diabetes mellitus without complications: Secondary | ICD-10-CM

## 2022-01-05 DIAGNOSIS — E785 Hyperlipidemia, unspecified: Secondary | ICD-10-CM | POA: Diagnosis not present

## 2022-01-05 DIAGNOSIS — Z7722 Contact with and (suspected) exposure to environmental tobacco smoke (acute) (chronic): Secondary | ICD-10-CM

## 2022-01-05 DIAGNOSIS — K219 Gastro-esophageal reflux disease without esophagitis: Secondary | ICD-10-CM | POA: Diagnosis not present

## 2022-01-05 DIAGNOSIS — C169 Malignant neoplasm of stomach, unspecified: Secondary | ICD-10-CM | POA: Insufficient documentation

## 2022-01-05 DIAGNOSIS — K760 Fatty (change of) liver, not elsewhere classified: Secondary | ICD-10-CM | POA: Insufficient documentation

## 2022-01-05 DIAGNOSIS — Z95828 Presence of other vascular implants and grafts: Secondary | ICD-10-CM

## 2022-01-05 DIAGNOSIS — Z79899 Other long term (current) drug therapy: Secondary | ICD-10-CM | POA: Diagnosis not present

## 2022-01-05 DIAGNOSIS — Z794 Long term (current) use of insulin: Secondary | ICD-10-CM

## 2022-01-05 LAB — GLUCOSE, CAPILLARY
Glucose-Capillary: 147 mg/dL — ABNORMAL HIGH (ref 70–99)
Glucose-Capillary: 109 mg/dL — ABNORMAL HIGH (ref 70–99)

## 2022-01-05 IMAGING — RF DG C-ARM 1-60 MIN
1 series · 1 of 1 positions shown · IV contrast (agent unspecified)
Comparison: None.

CLINICAL DATA: Port-A-Cath placement

EXAM:
DG C-ARM 1-60 MIN
CONTRAST:  None.
FLUOROSCOPY:
Fluoroscopy Time:  13 seconds
Radiation Exposure Index (if provided by the fluoroscopic device):
Air kerma 2.17 mGy
Number of Acquired Spot Images: 1

[Series 1: run · 1 of 1 slices shown]
[im 1/1]
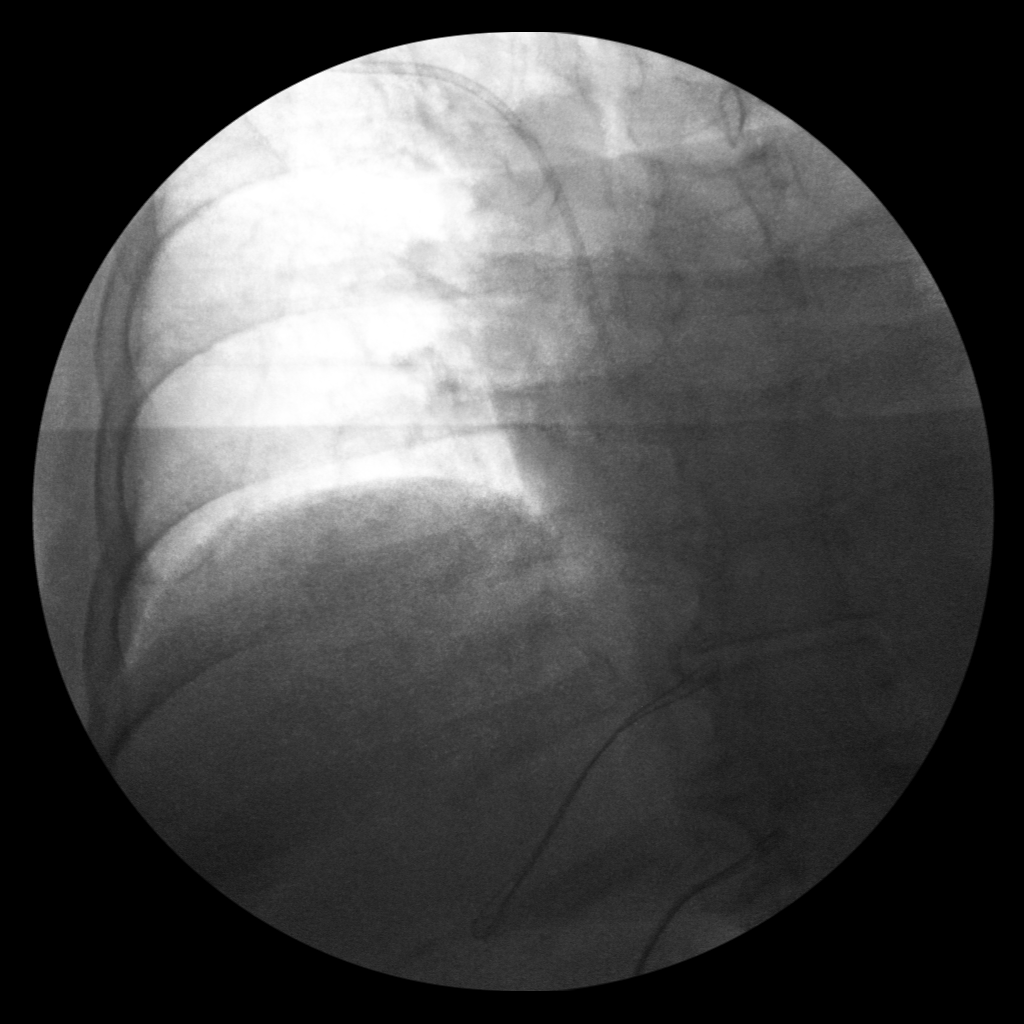

[1 of 1 positions shown; findings below may reference images not displayed]

FINDINGS: Single fluoroscopic image of the right hemithorax is submitted for
review. No obvious abnormality.
IMPRESSION: Single fluoroscopic image of the right hemithorax is submitted for
review. No obvious abnormality.

## 2022-01-05 IMAGING — DX DG CHEST 1V PORT
1 series · 1 of 1 positions shown · non-contrast
Comparison: Radiograph [DATE]

CLINICAL DATA: Right subclavian port

EXAM:
PORTABLE CHEST 1 VIEW

[chest ap]
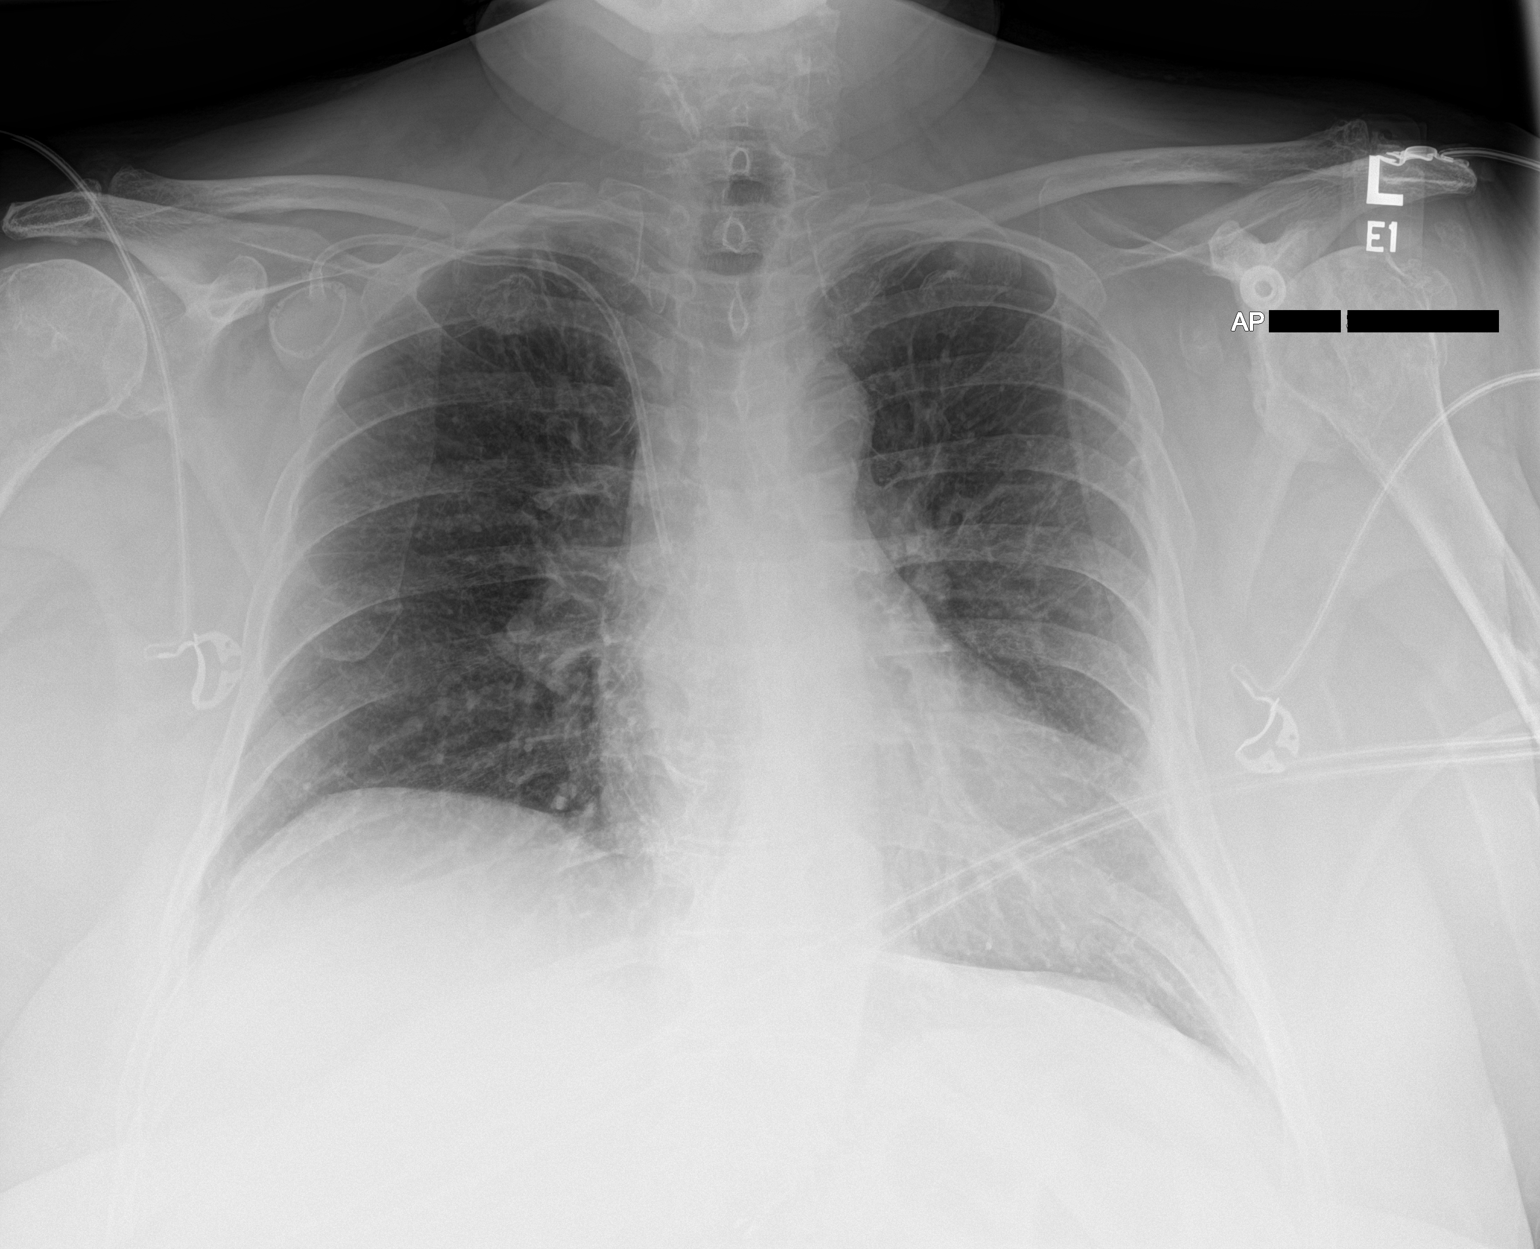

[1 of 1 positions shown; findings below may reference images not displayed]

FINDINGS: Right subclavian port with catheter tip overlying the distal
superior vena cava. Unchanged cardiomediastinal silhouette. There is
no focal airspace consolidation. There is no large pleural effusion.
There is no visible pneumothorax. There is no acute osseous
abnormality. Bilateral shoulder osteoarthritis.
IMPRESSION: Chest port catheter tip overlies the distal superior vena cava. No
pneumothorax.

## 2022-01-05 SURGERY — INSERTION, TUNNELED CENTRAL VENOUS DEVICE, WITH PORT
Anesthesia: General | Site: Chest | Laterality: Right

## 2022-01-05 MED ORDER — BUPIVACAINE-EPINEPHRINE 0.25% -1:200000 IJ SOLN
INTRAMUSCULAR | Status: DC | PRN
  Administered 2022-01-05: 10 mL

## 2022-01-05 MED ORDER — MIDAZOLAM HCL 2 MG/2ML IJ SOLN
INTRAMUSCULAR | Status: AC
  Filled 2022-01-05: qty 2

## 2022-01-05 MED ORDER — AMISULPRIDE (ANTIEMETIC) 5 MG/2ML IV SOLN: 10.0000 mg | Freq: Once | INTRAVENOUS | Status: DC | PRN

## 2022-01-05 MED ORDER — OXYCODONE HCL 5 MG/5ML PO SOLN: 5.0000 mg | Freq: Once | ORAL | Status: DC | PRN

## 2022-01-05 MED ORDER — EPHEDRINE 5 MG/ML INJ
INTRAVENOUS | Status: AC
  Filled 2022-01-05: qty 15

## 2022-01-05 MED ORDER — HEPARIN 6000 UNIT IRRIGATION SOLUTION
Status: DC | PRN
  Administered 2022-01-05: 1

## 2022-01-05 MED ORDER — BUPIVACAINE-EPINEPHRINE (PF) 0.25% -1:200000 IJ SOLN
INTRAMUSCULAR | Status: AC
  Filled 2022-01-05: qty 30

## 2022-01-05 MED ORDER — HEPARIN 6000 UNIT IRRIGATION SOLUTION
Status: AC
  Filled 2022-01-05: qty 500

## 2022-01-05 MED ORDER — LIDOCAINE 2% (20 MG/ML) 5 ML SYRINGE
INTRAMUSCULAR | Status: AC
  Filled 2022-01-05: qty 5

## 2022-01-05 MED ORDER — HEPARIN SOD (PORK) LOCK FLUSH 100 UNIT/ML IV SOLN
INTRAVENOUS | Status: DC | PRN
  Administered 2022-01-05: 500 [IU]

## 2022-01-05 MED ORDER — 0.9 % SODIUM CHLORIDE (POUR BTL) OPTIME
TOPICAL | Status: DC | PRN
  Administered 2022-01-05: 1000 mL

## 2022-01-05 MED ORDER — ONDANSETRON HCL 4 MG/2ML IJ SOLN: 4.0000 mg | Freq: Once | INTRAMUSCULAR | Status: DC | PRN

## 2022-01-05 MED ORDER — INSULIN ASPART 100 UNIT/ML IJ SOLN
0.0000 [IU] | INTRAMUSCULAR | Status: DC | PRN
  Administered 2022-01-05: 2 [IU] via SUBCUTANEOUS
  Filled 2022-01-05: qty 1

## 2022-01-05 MED ORDER — ACETAMINOPHEN 500 MG PO TABS
1000.0000 mg | ORAL_TABLET | Freq: Once | ORAL | Status: AC
  Administered 2022-01-05: 1000 mg via ORAL
  Filled 2022-01-05: qty 2

## 2022-01-05 MED ORDER — ORAL CARE MOUTH RINSE: 15.0000 mL | Freq: Once | OROMUCOSAL | Status: AC

## 2022-01-05 MED ORDER — PROPOFOL 10 MG/ML IV BOLUS
INTRAVENOUS | Status: DC | PRN
  Administered 2022-01-05: 150 mg via INTRAVENOUS

## 2022-01-05 MED ORDER — OXYCODONE HCL 5 MG PO TABS: 5.0000 mg | ORAL_TABLET | Freq: Once | ORAL | Status: DC | PRN

## 2022-01-05 MED ORDER — MIDAZOLAM HCL 5 MG/5ML IJ SOLN
INTRAMUSCULAR | Status: DC | PRN
  Administered 2022-01-05: 2 mg via INTRAVENOUS

## 2022-01-05 MED ORDER — PHENYLEPHRINE HCL-NACL 20-0.9 MG/250ML-% IV SOLN
INTRAVENOUS | Status: DC | PRN
  Administered 2022-01-05: 20 ug/min via INTRAVENOUS

## 2022-01-05 MED ORDER — CHLORHEXIDINE GLUCONATE 0.12 % MT SOLN
15.0000 mL | Freq: Once | OROMUCOSAL | Status: AC
  Administered 2022-01-05: 15 mL via OROMUCOSAL
  Filled 2022-01-05: qty 15

## 2022-01-05 MED ORDER — FENTANYL CITRATE (PF) 250 MCG/5ML IJ SOLN
INTRAMUSCULAR | Status: AC
  Filled 2022-01-05: qty 5

## 2022-01-05 MED ORDER — HEPARIN SOD (PORK) LOCK FLUSH 100 UNIT/ML IV SOLN
INTRAVENOUS | Status: AC
  Filled 2022-01-05: qty 5

## 2022-01-05 MED ORDER — FENTANYL CITRATE (PF) 100 MCG/2ML IJ SOLN: 25.0000 ug | INTRAMUSCULAR | Status: DC | PRN

## 2022-01-05 MED ORDER — PROPOFOL 10 MG/ML IV BOLUS
INTRAVENOUS | Status: AC
  Filled 2022-01-05: qty 20

## 2022-01-05 MED ORDER — EPHEDRINE SULFATE (PRESSORS) 50 MG/ML IJ SOLN
INTRAMUSCULAR | Status: DC | PRN
  Administered 2022-01-05 (×2): 5 mg via INTRAVENOUS
  Administered 2022-01-05: 10 mg via INTRAVENOUS
  Administered 2022-01-05: 5 mg via INTRAVENOUS

## 2022-01-05 MED ORDER — ONDANSETRON HCL 4 MG/2ML IJ SOLN
INTRAMUSCULAR | Status: DC | PRN
  Administered 2022-01-05: 4 mg via INTRAVENOUS

## 2022-01-05 MED ORDER — LACTATED RINGERS IV SOLN: INTRAVENOUS | Status: DC

## 2022-01-05 MED ORDER — DEXAMETHASONE SODIUM PHOSPHATE 4 MG/ML IJ SOLN
INTRAMUSCULAR | Status: DC | PRN
  Administered 2022-01-05: 5 mg via INTRAVENOUS

## 2022-01-05 MED ORDER — FENTANYL CITRATE (PF) 100 MCG/2ML IJ SOLN
INTRAMUSCULAR | Status: DC | PRN
  Administered 2022-01-05: 25 ug via INTRAVENOUS

## 2022-01-05 MED ORDER — CEFAZOLIN SODIUM-DEXTROSE 2-4 GM/100ML-% IV SOLN
2.0000 g | INTRAVENOUS | Status: AC
  Administered 2022-01-05: 2 g via INTRAVENOUS
  Filled 2022-01-05: qty 100

## 2022-01-05 MED ORDER — SCOPOLAMINE 1 MG/3DAYS TD PT72
1.0000 | MEDICATED_PATCH | TRANSDERMAL | Status: DC
  Administered 2022-01-05: 1.5 mg via TRANSDERMAL
  Filled 2022-01-05: qty 1

## 2022-01-05 MED ORDER — LIDOCAINE HCL (CARDIAC) PF 100 MG/5ML IV SOSY
PREFILLED_SYRINGE | INTRAVENOUS | Status: DC | PRN
  Administered 2022-01-05: 80 mg via INTRAVENOUS

## 2022-01-05 SURGICAL SUPPLY — 39 items
BAG COUNTER SPONGE SURGICOUNT (BAG) IMPLANT
BAG DECANTER FOR FLEXI CONT (MISCELLANEOUS) ×2 IMPLANT
BENZOIN TINCTURE PRP APPL 2/3 (GAUZE/BANDAGES/DRESSINGS) IMPLANT
BLADE SURG 15 STRL LF DISP TIS (BLADE) ×1 IMPLANT
BLADE SURG 15 STRL SS (BLADE) ×1
BLADE SURG SZ11 CARB STEEL (BLADE) ×2 IMPLANT
CHLORAPREP W/TINT 26 (MISCELLANEOUS) ×2 IMPLANT
CLSR STERI-STRIP ANTIMIC 1/2X4 (GAUZE/BANDAGES/DRESSINGS) IMPLANT
COVER SURGICAL LIGHT HANDLE (MISCELLANEOUS) ×2 IMPLANT
DERMABOND ADVANCED (GAUZE/BANDAGES/DRESSINGS) ×1
DERMABOND ADVANCED .7 DNX12 (GAUZE/BANDAGES/DRESSINGS) IMPLANT
DRAPE C-ARM 42X120 X-RAY (DRAPES) ×2 IMPLANT
DRAPE LAPAROSCOPIC ABDOMINAL (DRAPES) ×2 IMPLANT
DRAPE UTILITY 15X26 TOWEL STRL (DRAPES) ×2 IMPLANT
ELECT REM PT RETURN 15FT ADLT (MISCELLANEOUS) ×2 IMPLANT
GAUZE 4X4 16PLY ~~LOC~~+RFID DBL (SPONGE) ×2 IMPLANT
GAUZE SPONGE 4X4 12PLY STRL (GAUZE/BANDAGES/DRESSINGS) ×2 IMPLANT
GLOVE SURG POLYISO LF SZ5.5 (GLOVE) ×2 IMPLANT
GLOVE SURG POLYISO LF SZ6 (GLOVE) ×2 IMPLANT
GOWN STRL REUS W/ TWL LRG LVL3 (GOWN DISPOSABLE) ×1 IMPLANT
GOWN STRL REUS W/TWL LRG LVL3 (GOWN DISPOSABLE) ×1
KIT BASIN OR (CUSTOM PROCEDURE TRAY) ×2 IMPLANT
KIT PORT POWER 8FR ISP CVUE (Port) ×2 IMPLANT
KIT TURNOVER KIT A (KITS) IMPLANT
NEEDLE HYPO 22GX1.5 SAFETY (NEEDLE) ×2 IMPLANT
PACK BASIC VI WITH GOWN DISP (CUSTOM PROCEDURE TRAY) ×2 IMPLANT
PENCIL SMOKE EVACUATOR (MISCELLANEOUS) ×2 IMPLANT
SPIKE FLUID TRANSFER (MISCELLANEOUS) ×2 IMPLANT
SUT MNCRL AB 4-0 PS2 18 (SUTURE) ×2 IMPLANT
SUT PROLENE 2 0 SH DA (SUTURE) ×4 IMPLANT
SUT VIC AB 2-0 SH 18 (SUTURE) IMPLANT
SUT VIC AB 2-0 SH 27 (SUTURE)
SUT VIC AB 2-0 SH 27X BRD (SUTURE) IMPLANT
SUT VIC AB 3-0 SH 27 (SUTURE) ×1
SUT VIC AB 3-0 SH 27X BRD (SUTURE) ×1 IMPLANT
SYR 10ML LL (SYRINGE) ×2 IMPLANT
SYR 20ML LL LF (SYRINGE) ×2 IMPLANT
TOWEL OR 17X26 10 PK STRL BLUE (TOWEL DISPOSABLE) ×2 IMPLANT
TOWEL OR NON WOVEN STRL DISP B (DISPOSABLE) ×2 IMPLANT

## 2022-01-05 NOTE — Op Note (Signed)
Date: 01/05/22 ? ?Patient: Joanna [name] ?MRN: [id number] ? ?Preoperative Diagnosis: Gastric cancer ?Postoperative Diagnosis: Same ? ?Procedure: Port-a-cath insertion ? ?Surgeon: Michaelle Birks, MD ? ?EBL: Minimal ? ?Anesthesia: General LMA ? ?Specimens: None ? ?Indications: Joanna [name] is a 56 yo female who recently presented with a gastric mass, for which she underwent laparoscopic exploration and wedge resection. Intraoperatively she was also noted to have nodules on the diaphragm, biopsies of which confirmed metastatic adenocarcinoma. She is to begin treatment with chemotherapy and presents for port placement. ? ?Findings: 8-Fr single-lumen power-port placed via the right subclavian vein under fluoroscopic guidance. Total catheter length of 15cm. ? ?Procedure details: Informed consent was obtained in the preoperative area prior to the procedure. The patient was brought to the operating room and placed on the table in the supine position. General anesthesia was induced and appropriate lines and drains were placed for intraoperative monitoring. Perioperative antibiotics were administered per SCIP guidelines. The neck and chest were prepped and draped in the usual sterile fashion. A pre-procedure timeout was taken verifying patient identity, surgical site and procedure to be performed. ? ?The patient was placed in Trendelenberg and the right vein was accessed with a large-bore needle. A guidewire was inserted and advanced, and position in the SVC was confirmed fluoroscopically. The needle was removed and the wire was clipped to the drapes to secure its position. Next a small skin incision was made on the right upper chest wall, incorporating the wire exit site, and a subcutaneous pocket was created with cautery. The port and catheter were then flushed and brought onto the field. Three 2-0 prolene sutures were used to secure the port in the subcutaneous pocket to the pectoralis fascia, but the sutures were not tied down.  The port was placed in the pocket and the catheter was then measured using fluoro - it was placed over the skin adjacent to the guidewire, and marked externally at the cavoatrial junction. The catheter was then cut at this location, which was at 15cm. The dilator and sheath were then advanced over the guidewire under fluoroscopic guidance, and the wire and dilator were removed. The end of the catheter was inserted through the sheath and advanced, and the sheath was peeled away. The port was then accessed with a Huber needle, and blood was aspirated and the port was flushed with heparinized saline. A final fluoroscopic image confirmed appropriate position of the catheter tip within the SVC, without kinking of the catheter. The prolene sutures were tied down. A final flush of 500 units heparin (100 units/mL) was given via the port. The skin was closed with a deep dermal layer of interrupted 3-0 Vicryl suture, followed by a running subcuticular 4-0 monocryl suture. Dermabond was applied. ? ?The patient tolerated the procedure with no apparent complications. All counts were correct x2 at the end of the procedure. The patient was extubated and taken to PACU in stable condition. ? ?Michaelle Birks, MD ?01/05/22 ?11:57 AM ? ? ?

## 2022-01-05 NOTE — Transfer of Care (Signed)
Immediate Anesthesia Transfer of Care Note ? ?Patient: Joanna [name] ? ?Procedure(s) Performed: INSERTION PORT-A-CATH (Right: Chest) ? ?Patient Location: PACU ? ?Anesthesia Type:General ? ?Level of Consciousness: awake, alert  and oriented ? ?Airway & Oxygen Therapy: Patient Spontanous Breathing and Patient connected to nasal cannula oxygen ? ?Post-op Assessment: Report given to RN and Post -op Vital signs reviewed and stable ? ?Post vital signs: Reviewed and stable ? ?Last Vitals:  ?Vitals Value Taken Time  ?BP 112/96 01/05/22 1210  ?Temp    ?Pulse 79 01/05/22 1210  ?Resp 13 01/05/22 1210  ?SpO2 97 % 01/05/22 1210  ?Vitals shown include unvalidated device data. ? ?Last Pain:  ?Vitals:  ? 01/05/22 0949  ?TempSrc:   ?PainSc: 0-No pain  ?   ? ?  ? ?Complications: No notable events documented. ?

## 2022-01-05 NOTE — Interval H&P Note (Signed)
History and Physical Interval Note: ? ?01/05/2022 ?10:50 AM ? ?Chanie Soucek  has presented today for surgery, with the diagnosis of GASTRIC CANCER.  The various methods of treatment have been discussed with the patient and family. After consideration of risks, benefits and other options for treatment, the patient has consented to  Procedure(s): ?INSERTION PORT-A-CATH (N/A) as a surgical intervention.  The patient's history has been reviewed, patient examined, no change in status, stable for surgery.  I have reviewed the patient's chart and labs.  Questions were answered to the patient's satisfaction. ? ? ?Dwan Bolt ? ? ?

## 2022-01-05 NOTE — Telephone Encounter (Signed)
Per Cira Rue, NP, called pt with message below. Pt verbalized understanding.  ?

## 2022-01-05 NOTE — Telephone Encounter (Signed)
-----   Message from Alla Feeling, NP sent at 01/03/2022 10:30 AM EST ----- ?Please let pt know baseline labs are all normal. No anemia, iron deficiency, liver, or kidney dysfunction. Normal electrolytes. CEA is normal. ?Thanks, ?Regan Rakers, NP ?

## 2022-01-05 NOTE — Discharge Instructions (Addendum)
SURGERY DISCHARGE INSTRUCTIONS: PORT-A-CATH PLACEMENT ? ?Activity ?You may resume your usual activities as tolerated ?Ok to shower in 24 hours, but do not bathe or submerge incision underwater. ?Do not drive while taking narcotic pain medication. ? ?Pain ?It is normal to have mild pain and discomfort at the incision. You may take Tylenol as needed for pain. ? ?Wound Care ?Your incision is covered with skin glue called Dermabond. This will peel off on its own over time. ?You may shower and allow warm soapy water to run over your incisions. Gently pat dry. ?Do not submerge your incision underwater. ?Monitor your incision for any new redness, tenderness, or drainage. ?You may start using your port in 48 hours. Do not apply EMLA cream directly over the Dermabond (skin glue). ? ?When to Call us: ?Fever greater than 100.5 ?New redness, drainage, or swelling at incision site ?Severe pain, nausea, or vomiting ?Shortness of breath, difficulty breathing ? ?For questions or concerns, please call the [facility] office at [phone]. ? ?

## 2022-01-05 NOTE — Anesthesia Procedure Notes (Signed)
Procedure Name: LMA Insertion ?Date/Time: 01/05/2022 11:30 AM ?Performed by: Terrence Dupont, CRNA ?Pre-anesthesia Checklist: Patient identified, Emergency Drugs available, Suction available and Patient being monitored ?Patient Re-evaluated:Patient Re-evaluated prior to induction ?Oxygen Delivery Method: Circle system utilized ?Preoxygenation: Pre-oxygenation with 100% oxygen ?Induction Type: IV induction ?Ventilation: Mask ventilation without difficulty ?LMA Size: 4.0 ?Tube type: Oral ?Number of attempts: 1 ?Airway Equipment and Method: Stylet and Oral airway ?Placement Confirmation: ETT inserted through vocal cords under direct vision, positive ETCO2 and breath sounds checked- equal and bilateral ?Tube secured with: Tape ?Dental Injury: Teeth and Oropharynx as per pre-operative assessment  ? ? ? ? ?

## 2022-01-05 NOTE — Anesthesia Postprocedure Evaluation (Signed)
Anesthesia Post Note ? ?Patient: Joanna [name] ? ?Procedure(s) Performed: INSERTION PORT-A-CATH (Right: Chest) ? ?  ? ?Patient location during evaluation: PACU ?Anesthesia Type: General ?Level of consciousness: awake ?Pain management: pain level controlled ?Vital Signs Assessment: post-procedure vital signs reviewed and stable ?Respiratory status: spontaneous breathing and respiratory function stable ?Cardiovascular status: stable ?Postop Assessment: no apparent nausea or vomiting ?Anesthetic complications: no ? ? ?No notable events documented. ? ?Last Vitals:  ?Vitals:  ? 01/05/22 1225 01/05/22 1240  ?BP: (!) 108/94 127/71  ?Pulse: 70 77  ?Resp: 19 18  ?Temp:    ?SpO2: 93% 95%  ?  ?Last Pain:  ?Vitals:  ? 01/05/22 1210  ?TempSrc:   ?PainSc: 0-No pain  ? ? ?  ?  ?  ?  ?  ?  ? ?Merlinda Frederick ? ? ? ? ?

## 2022-01-06 ENCOUNTER — Encounter (HOSPITAL_COMMUNITY): Payer: Self-pay | Admitting: Surgery

## 2022-01-06 NOTE — Progress Notes (Signed)
Pharmacist Chemotherapy Monitoring - Initial Assessment   ? ?Anticipated start date: 01/13/22  ? ?The following has been reviewed per standard work regarding the patient's treatment regimen: ?The patient's diagnosis, treatment plan and drug doses, and organ/hematologic function ?Lab orders and baseline tests specific to treatment regimen  ?The treatment plan start date, drug sequencing, and pre-medications ?Prior authorization status  ?Patient's documented medication list, including drug-drug interaction screen and prescriptions for anti-emetics and supportive care specific to the treatment regimen ?The drug concentrations, fluid compatibility, administration routes, and timing of the medications to be used ?The patient's access for treatment and lifetime cumulative dose history, if applicable  ?The patient's medication allergies and previous infusion related reactions, if applicable  ? ?Changes made to treatment plan:  ?treatment plan date ? ?Follow up needed:  ?Pending authorization for treatment  ? ? ?Joanna [name], [place], ?01/06/2022  1:26 PM ? ?

## 2022-01-07 ENCOUNTER — Other Ambulatory Visit: Payer: Self-pay

## 2022-01-07 ENCOUNTER — Inpatient Hospital Stay: Payer: Medicare HMO

## 2022-01-07 ENCOUNTER — Telehealth: Payer: Self-pay

## 2022-01-07 DIAGNOSIS — C169 Malignant neoplasm of stomach, unspecified: Secondary | ICD-10-CM

## 2022-01-07 NOTE — Progress Notes (Signed)
The proposed treatment discussed in conference is for discussion purpose only and is not a binding recommendation.  The patients have not been physically examined, or presented with their treatment options.  Therefore, final treatment plans cannot be decided.  

## 2022-01-07 NOTE — Telephone Encounter (Signed)
Ms Goedecke cane for Education class at 1400.  She inquired about how her right chest above port site to collar bone, port site and below to breast was red.  The are in the breast showed bruising as well. ?Pt temp 98.9 orally. ?Port incision red with no purulent drainage. ?No itching. ?The port site was tender to touch but not worse since fresh wound on 01-05-22. ?Dr. Burr Medico reviewed picture of right chest port site. Pt. gave verbal consent to picture. ?Dr. Burr Medico will discuss with Dr. Zenia Resides. Dr. Burr Medico recommended that Ms Strehle put hydrocortisone cream on chest. Pt to call if purulent drainage noted, Temp increases to 100.4 or greater, or increased pain at port site to call Dr. Ayesha Rumpf office. Pt verbalized understanding. ?Picture of port site deleted from phone.   ?

## 2022-01-08 ENCOUNTER — Encounter (HOSPITAL_COMMUNITY): Payer: Self-pay | Admitting: Surgery

## 2022-01-12 ENCOUNTER — Telehealth: Payer: Self-pay | Admitting: Hematology

## 2022-01-12 LAB — SURGICAL PATHOLOGY

## 2022-01-12 NOTE — Telephone Encounter (Signed)
.  Called patient to schedule appointment per 3/17 inbasket, patient is aware of date and time.   ?

## 2022-01-13 ENCOUNTER — Inpatient Hospital Stay: Payer: Medicare HMO

## 2022-01-13 ENCOUNTER — Inpatient Hospital Stay: Payer: Medicare HMO | Admitting: Nurse Practitioner

## 2022-01-15 ENCOUNTER — Inpatient Hospital Stay: Payer: Medicare HMO

## 2022-01-15 ENCOUNTER — Inpatient Hospital Stay: Payer: Medicare HMO | Admitting: Dietician

## 2022-01-19 ENCOUNTER — Inpatient Hospital Stay: Payer: Medicare HMO

## 2022-01-19 ENCOUNTER — Inpatient Hospital Stay: Payer: Medicare HMO | Admitting: Hematology

## 2022-01-19 ENCOUNTER — Other Ambulatory Visit: Payer: Self-pay

## 2022-01-19 MED ORDER — FLUTICASONE PROPIONATE 50 MCG/ACT NA SUSP: 2.0000 | Freq: Every day | NASAL | 0 refills | Status: DC

## 2022-01-19 NOTE — Telephone Encounter (Signed)
Patient called needing a 90 day refill for Flonase nasal spray. I sent in a  courtesy refill. Patient has an office and shot on 02/17/22.  ?

## 2022-01-22 ENCOUNTER — Encounter (HOSPITAL_COMMUNITY): Payer: Self-pay | Admitting: Hematology

## 2022-01-26 ENCOUNTER — Inpatient Hospital Stay: Payer: Medicare HMO

## 2022-01-26 ENCOUNTER — Inpatient Hospital Stay: Payer: Medicare HMO | Admitting: Hematology

## 2022-01-27 ENCOUNTER — Other Ambulatory Visit: Payer: Self-pay

## 2022-01-27 ENCOUNTER — Encounter: Payer: Self-pay | Admitting: Nurse Practitioner

## 2022-01-29 ENCOUNTER — Ambulatory Visit (INDEPENDENT_AMBULATORY_CARE_PROVIDER_SITE_OTHER): Payer: Medicare HMO

## 2022-01-29 ENCOUNTER — Encounter: Payer: Self-pay | Admitting: Hematology

## 2022-01-29 ENCOUNTER — Other Ambulatory Visit: Payer: Self-pay

## 2022-01-29 ENCOUNTER — Inpatient Hospital Stay: Payer: Medicare HMO | Admitting: Hematology

## 2022-01-29 ENCOUNTER — Inpatient Hospital Stay: Payer: Medicare HMO | Attending: Nurse Practitioner

## 2022-01-29 ENCOUNTER — Inpatient Hospital Stay: Payer: Medicare HMO | Admitting: Genetic Counselor

## 2022-01-29 ENCOUNTER — Other Ambulatory Visit: Payer: Medicare HMO

## 2022-01-29 VITALS — BP 147/75 | HR 65 | Temp 98.4°F | Resp 18 | Ht 63.0 in | Wt 237.8 lb

## 2022-01-29 DIAGNOSIS — E119 Type 2 diabetes mellitus without complications: Secondary | ICD-10-CM | POA: Insufficient documentation

## 2022-01-29 DIAGNOSIS — R634 Abnormal weight loss: Secondary | ICD-10-CM | POA: Diagnosis not present

## 2022-01-29 DIAGNOSIS — C169 Malignant neoplasm of stomach, unspecified: Secondary | ICD-10-CM

## 2022-01-29 DIAGNOSIS — Z452 Encounter for adjustment and management of vascular access device: Secondary | ICD-10-CM | POA: Diagnosis not present

## 2022-01-29 DIAGNOSIS — K2971 Gastritis, unspecified, with bleeding: Secondary | ICD-10-CM | POA: Insufficient documentation

## 2022-01-29 DIAGNOSIS — I10 Essential (primary) hypertension: Secondary | ICD-10-CM | POA: Insufficient documentation

## 2022-01-29 DIAGNOSIS — J309 Allergic rhinitis, unspecified: Secondary | ICD-10-CM | POA: Diagnosis not present

## 2022-01-29 DIAGNOSIS — K254 Chronic or unspecified gastric ulcer with hemorrhage: Secondary | ICD-10-CM | POA: Diagnosis not present

## 2022-01-29 DIAGNOSIS — K219 Gastro-esophageal reflux disease without esophagitis: Secondary | ICD-10-CM | POA: Insufficient documentation

## 2022-01-29 DIAGNOSIS — Z95828 Presence of other vascular implants and grafts: Secondary | ICD-10-CM

## 2022-01-29 DIAGNOSIS — D131 Benign neoplasm of stomach: Secondary | ICD-10-CM | POA: Insufficient documentation

## 2022-01-29 LAB — TSH: TSH: 3.327 u[IU]/mL (ref 0.308–3.960)

## 2022-01-29 LAB — CBC WITH DIFFERENTIAL (CANCER CENTER ONLY)
Abs Immature Granulocytes: 0.01 10*3/uL (ref 0.00–0.07)
Basophils Absolute: 0 10*3/uL (ref 0.0–0.1)
Basophils Relative: 0 %
Eosinophils Absolute: 0 10*3/uL (ref 0.0–0.5)
Eosinophils Relative: 0 %
HCT: 41.4 % (ref 36.0–46.0)
Hemoglobin: 13.6 g/dL (ref 12.0–15.0)
Immature Granulocytes: 0 %
Lymphocytes Relative: 16 %
Lymphs Abs: 1 10*3/uL (ref 0.7–4.0)
MCH: 29.8 pg (ref 26.0–34.0)
MCHC: 32.9 g/dL (ref 30.0–36.0)
MCV: 90.8 fL (ref 80.0–100.0)
Monocytes Absolute: 0.5 10*3/uL (ref 0.1–1.0)
Monocytes Relative: 8 %
Neutro Abs: 4.7 10*3/uL (ref 1.7–7.7)
Neutrophils Relative %: 76 %
Platelet Count: 159 10*3/uL (ref 150–400)
RBC: 4.56 MIL/uL (ref 3.87–5.11)
RDW: 13.7 % (ref 11.5–15.5)
WBC Count: 6.2 10*3/uL (ref 4.0–10.5)
nRBC: 0 % (ref 0.0–0.2)

## 2022-01-29 LAB — CMP (CANCER CENTER ONLY)
ALT: 17 U/L (ref 0–44)
AST: 22 U/L (ref 15–41)
Albumin: 4.3 g/dL (ref 3.5–5.0)
Alkaline Phosphatase: 65 U/L (ref 38–126)
Anion gap: 7 (ref 5–15)
BUN: 16 mg/dL (ref 6–20)
CO2: 30 mmol/L (ref 22–32)
Calcium: 9.4 mg/dL (ref 8.9–10.3)
Chloride: 104 mmol/L (ref 98–111)
Creatinine: 0.91 mg/dL (ref 0.44–1.00)
GFR, Estimated: >60 mL/min (ref >60)
Glucose, Bld: 112 mg/dL — ABNORMAL HIGH (ref 70–99)
Potassium: 4 mmol/L (ref 3.5–5.1)
Sodium: 141 mmol/L (ref 135–145)
Total Bilirubin: 0.3 mg/dL (ref 0.3–1.2)
Total Protein: 7.2 g/dL (ref 6.5–8.1)

## 2022-01-29 LAB — CEA (IN HOUSE-CHCC): CEA (CHCC-In House): 2.73 ng/mL (ref 0.00–5.00)

## 2022-01-29 MED ORDER — SODIUM CHLORIDE 0.9% FLUSH
10.0000 mL | INTRAVENOUS | Status: AC | PRN
  Administered 2022-01-29: 10 mL

## 2022-01-29 MED ORDER — HEPARIN SOD (PORK) LOCK FLUSH 100 UNIT/ML IV SOLN
500.0000 [IU] | INTRAVENOUS | Status: AC | PRN
  Administered 2022-01-29: 500 [IU]
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
## 2022-02-09 ENCOUNTER — Other Ambulatory Visit: Payer: Self-pay

## 2022-02-09 ENCOUNTER — Telehealth: Payer: Self-pay

## 2022-02-09 NOTE — Telephone Encounter (Signed)
Pt called requesting if Dr. Burr Medico could please put in an order to have her PAC removed so Dr. Daneil Dan staff can schedule her PAC removal appt.  Asked Dr. Burr Medico about [facility] removal order and Dr. Burr Medico stated no order is needed since the [facility] was placed by Dr. Zenia Resides.  Dr. Burr Medico asked this nurse to send Dr. Zenia Resides a staff message regarding pt's PAC removal.  Sent staff message on 02/09/2022.  Awaiting Dr. Ayesha Rumpf response.  ?

## 2022-02-12 ENCOUNTER — Other Ambulatory Visit: Payer: Self-pay | Admitting: Internal Medicine

## 2022-02-12 NOTE — Telephone Encounter (Signed)
Per Dr. Simona Huh she can follow up in 4-6 months so any fills can be sent.  ?

## 2022-02-13 ENCOUNTER — Other Ambulatory Visit: Payer: Self-pay | Admitting: Internal Medicine

## 2022-02-17 ENCOUNTER — Ambulatory Visit (INDEPENDENT_AMBULATORY_CARE_PROVIDER_SITE_OTHER): Payer: Medicare HMO | Admitting: Family Medicine

## 2022-02-17 ENCOUNTER — Ambulatory Visit (INDEPENDENT_AMBULATORY_CARE_PROVIDER_SITE_OTHER): Payer: Medicare HMO

## 2022-02-17 ENCOUNTER — Encounter: Payer: Self-pay | Admitting: Family Medicine

## 2022-02-17 VITALS — BP 134/80 | HR 70 | Temp 98.3°F | Resp 18 | Wt 239.0 lb

## 2022-02-17 DIAGNOSIS — B999 Unspecified infectious disease: Secondary | ICD-10-CM | POA: Diagnosis not present

## 2022-02-17 DIAGNOSIS — J3089 Other allergic rhinitis: Secondary | ICD-10-CM | POA: Diagnosis not present

## 2022-02-17 DIAGNOSIS — J4550 Severe persistent asthma, uncomplicated: Secondary | ICD-10-CM | POA: Diagnosis not present

## 2022-02-17 DIAGNOSIS — R053 Chronic cough: Secondary | ICD-10-CM | POA: Diagnosis not present

## 2022-02-17 DIAGNOSIS — J302 Other seasonal allergic rhinitis: Secondary | ICD-10-CM

## 2022-02-17 DIAGNOSIS — K219 Gastro-esophageal reflux disease without esophagitis: Secondary | ICD-10-CM

## 2022-02-17 NOTE — Patient Instructions (Addendum)
Asthma ?Stop montelukast at this time.  If you begin to experience symptoms including cough, wheeze, or shortness of breath restart montelukast and call the clinic ?Continue Breztri 2 puffs twice a day with a spacer to prevent cough or wheeze ?Continue albuterol 2 puffs every 4 hours as needed for cough or wheeze OR Instead use albuterol 0.083% solution via nebulizer one unit vial every 4 hours as needed for cough or wheeze ?You may use albuterol 2 puffs 5 to 15 minutes before activity to decrease cough or wheeze ? ?Allergic rhinitis ?Continue allergen avoidance measures directed toward pollen, mold, dust mite, cat, and dog as listed below ?Continue allergen immunotherapy and have access to an epinephrine autoinjector set ?You may take an antihistamine once a day as needed for a runny nose or itch. Remember to rotate to a different antihistamine about every 3 months. Some examples of over the counter antihistamines include Zyrtec (cetirizine), Xyzal (levocetirizine), Allegra (fexofenadine), and Claritin (loratidine). ?Continue Flonase 2 sprays in each nostril once a day as needed for a stuffy nose. In the right nostril, point the applicator out toward the right ear. In the left nostril, point the applicator out toward the left ear ?Consider saline nasal rinses as needed for nasal symptoms. Use this before any medicated nasal sprays for best result ? ?Reflux ?Continue dietary modifications as listed below ?Continue Dexilant 60 mg twice a day and follow-up with your gastroenterologist as recommended ? ?Food allergy ?Continue to avoid shellfish.  In case of an allergic reaction, take Benadryl 50 mg every 4 hours, and if life-threatening symptoms occur, inject with EpiPen 0.3 mg. ? ?Stinging insect allergy ?Continue to avoid stinging insects. In case of an allergic reaction, take Benadryl 50 mg every 4 hours, and if life-threatening symptoms occur, inject with EpiPen 0.3 mg. ? ?Call the clinic if this treatment plan is  not working well for you. ? ?Follow up in 2 months or sooner if needed. ? ?

## 2022-02-17 NOTE — Progress Notes (Signed)
? ?Joanna [name] ?Dept: [phone] ? ?FOLLOW UP NOTE ? ?Patient ID: Joanna [name], female    DOB: 1966/04/01  Age: 56 y.o. MRN: [id number] ?Date of Office Visit: 02/17/2022 ? ?Assessment  ?Chief Complaint: Follow-up (Pt states have been doing very well her allergies symptoms are well managed with current treatment plan.) and Allergic Rhinitis  ? ?HPI ?[name] is a 56 year old female who presents the clinic for follow-up visit.  She was last seen in this clinic on 09/11/2021 by Dr. Simona Huh for evaluation of asthma, allergic rhinitis on allergen immunotherapy, chronic cough, food allergy to shellfish, and stinging insect allergy.  In the interim, a gastric mass noted on the lateral aspect of her stomach as well as mild tracheomalacia. EGD with Dr. Rush Landmark indicated a 3 cm submural lesion on the lesser curve originating from the muscularis. FNA showed atypical cells with low grade epithelial proliferation. She was referred for surgical resection. She was referred to Worthy Keeler, pulmonology specialist, for further evaluation of cough with her initial appointment on 10/23/2022. Repeat EGD on 10/30/2021 showed a hyperplastic polyp on the lesser curve of the stomach, which was removed. EUS showed a 3.5 cm intramural lesion at the fundus of the stomach corresponding to the mass seen on imaging. There was no additional lesser curve mass. FNA of the mass was performed and showed neoplastic cells but did not yield a specific diagnosis. The mass appeared to originate in the muscularis propria on EUS. 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 She was scheduled for resection via a laparoscopic gastric wedge resection. Gastric resection was performed on 12/10/2021. 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 The gastric mass was excised via wedge resection and path showed  adenocarcinoma. Port-a-cath placed 01/05/2022. Pathology slides reviewed and revised on 12/31/2021 as follows' "Her case was reviewed in our GI tumor conference. The slides were reviewed by by our GI pathologist Dr. Vic Ripper. 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 We also sent out the pathology to [facility] for a second opinion, and they agreed with diagnosis of adenomatoid tumor". Chemotherapy has been canceled and Port-a-cathremoval scheduled for 03/02/2022. ?At today's visit, she reports her asthma has been well controlled with no shortness of breath, cough, or wheeze with activity or rest.  She does report occasional cough producing thick phlegm mostly occurring in the morning.  She continues montelukast 10 mg once a day, Breztri 2 puffs twice a day with a spacer, and no albuterol since her gastric resection on 12/10/2021.  She continues Fasenra injections once every 8 8 weeks with no larger local reactions.  She reports a significant decrease in her symptoms of asthma while continuing on Fasenra.  Allergic rhinitis is reported as well controlled with no symptoms including rhinorrhea, nasal congestion, sneeze, or postnasal drainage.  She is currently using a nasal saline rinse followed by Flonase with excellent application technique every evening.  She is not currently taking an antihistamine.  She continues allergen immunotherapy once every 4 weeks with 1 incident of a large local reaction.  She reports a significant decrease in her symptoms of allergic rhinitis while continuing on allergen immunotherapy.  She continues to avoid shellfish with no accidental ingestion or EpiPen use since her last visit to this clinic.  She continues to avoid stinging insects with no  incidences since her last visit to this clinic.  Reflux is reported as well controlled with Dexilant 60 mg twice a day.  She reports that since her gastric resection her  disease dates including asthma, reflux, and chronic pain have dramatically improved.  She is interested in consolidating medications and taking fewer medications if possible at this time.  Her current medications are listed in the chart. ? ? ?Drug Allergies:  ?Allergies  ?Allergen Reactions  ? Bee Venom Anaphylaxis  ? Shellfish Allergy Anaphylaxis  ? Topamax [Topiramate] Anaphylaxis, Itching and Swelling  ? Amoxicillin Swelling  ? Ivp Dye [Iodinated Contrast Media] Hives  ? Penicillins Swelling, Hives and Itching  ? Sulfa Antibiotics Swelling  ? ? ?Physical Exam: ?BP 134/80   Pulse 70   Temp 98.3 ?F (36.8 ?C) (Temporal)   Resp 18   Wt 239 lb (108.4 kg)   SpO2 97%   BMI 42.34 kg/m?   ? ?Physical Exam ?Vitals reviewed.  ?Constitutional:   ?   Appearance: Normal appearance.  ?HENT:  ?   Head: Normocephalic and atraumatic.  ?   Right Ear: Tympanic membrane normal.  ?   Left Ear: Tympanic membrane normal.  ?   Nose:  ?   Comments: Bilateral nares slightly erythematous with clear nasal drainage noted.  Pharynx normal.  Ears normal.  Eyes normal. ?   Mouth/Throat:  ?   Pharynx: Oropharynx is clear.  ?Eyes:  ?   Conjunctiva/sclera: Conjunctivae normal.  ?Cardiovascular:  ?   Rate and Rhythm: Normal rate and regular rhythm.  ?   Heart sounds: Normal heart sounds. No murmur heard. ?Pulmonary:  ?   Effort: Pulmonary effort is normal.  ?   Breath sounds: Normal breath sounds.  ?   Comments: Lungs clear to auscultation ?Musculoskeletal:     ?   General: Normal range of motion.  ?   Cervical back: Normal range of motion and neck supple.  ?Skin: ?   General: Skin is warm and dry.  ?Neurological:  ?   Mental Status: She is alert and oriented to person, place, and time.  ?Psychiatric:     ?   Mood and Affect: Mood normal.     ?   Behavior: Behavior normal.     ?   Thought Content: Thought content normal.     ?   Judgment: Judgment normal.  ? ? ?Diagnostics: ?FVC 2.00, FEV1 1.71.  Predicted FVC 3.23, predicted FEV1 2.55.   Spirometry indicates moderate restriction.  This is consistent with previous spirometry readings. ? ?Assessment and Plan: ?1. Severe persistent asthma without complication   ?2. Seasonal and perennial allergic rhinitis   ?3. Chronic cough   ?4. Recurrent infections   ?5. Gastroesophageal reflux disease, unspecified whether esophagitis present   ? ? ?Patient Instructions  ?Asthma ?Stop montelukast at this time.  If you begin to experience symptoms including cough, wheeze, or shortness of breath restart montelukast and call the clinic ?Continue Breztri 2 puffs twice a day with a spacer to prevent cough or wheeze ?Continue albuterol 2 puffs every 4 hours as needed for cough or wheeze OR Instead use albuterol 0.083% solution via nebulizer one unit vial every 4 hours as needed for cough or wheeze ?You may use albuterol 2 puffs 5 to 15 minutes before activity to decrease cough or wheeze ? ?Allergic rhinitis ?Continue allergen avoidance measures directed toward pollen, mold, dust mite, cat, and dog as listed below ?Continue allergen immunotherapy and have access to an epinephrine  autoinjector set ?You may take an antihistamine once a day as needed for a runny nose or itch. Remember to rotate to a different antihistamine about every 3 months. Some examples of over the counter antihistamines include Zyrtec (cetirizine), Xyzal (levocetirizine), Allegra (fexofenadine), and Claritin (loratidine). ?Continue Flonase 2 sprays in each nostril once a day as needed for a stuffy nose. In the right nostril, point the applicator out toward the right ear. In the left nostril, point the applicator out toward the left ear ?Consider saline nasal rinses as needed for nasal symptoms. Use this before any medicated nasal sprays for best result ? ?Reflux ?Continue dietary modifications as listed below ?Continue Dexilant 60 mg twice a day and follow-up with your gastroenterologist as recommended ? ?Food allergy ?Continue to avoid shellfish.  In  case of an allergic reaction, take Benadryl 50 mg every 4 hours, and if life-threatening symptoms occur, inject with EpiPen 0.3 mg. ? ?Stinging insect allergy ?Continue to avoid stinging insects. In case of an allergic re

## 2022-02-20 ENCOUNTER — Other Ambulatory Visit: Payer: Self-pay | Admitting: *Deleted

## 2022-02-20 ENCOUNTER — Telehealth: Payer: Self-pay

## 2022-02-20 MED ORDER — DEXLANSOPRAZOLE 60 MG PO CPDR: 60.0000 mg | DELAYED_RELEASE_CAPSULE | Freq: Two times a day (BID) | ORAL | 3 refills | Status: DC

## 2022-02-20 NOTE — Telephone Encounter (Signed)
Pa submitted and approved thru [employer] medic[employer] for dexilant '60mg'$  capsules case # [id number] from march 29-2023 to April 27-2024 pt informed ?

## 2022-02-24 ENCOUNTER — Ambulatory Visit (INDEPENDENT_AMBULATORY_CARE_PROVIDER_SITE_OTHER): Payer: Medicare HMO

## 2022-02-24 DIAGNOSIS — J309 Allergic rhinitis, unspecified: Secondary | ICD-10-CM | POA: Diagnosis not present

## 2022-02-26 ENCOUNTER — Encounter (HOSPITAL_COMMUNITY): Payer: Self-pay | Admitting: Surgery

## 2022-02-26 ENCOUNTER — Other Ambulatory Visit: Payer: Self-pay

## 2022-02-26 NOTE — Progress Notes (Addendum)
Spoke with pt for pre-op call. Pt has an extensive medical history. Pt recently had a port-a-cath placed to start chemo. Just prior to starting chemo she found that she did not have cancer and she does not need the port-a-cath.  ?Pt is a type 2 diabetic, last A1C was 6.5 on 01/13/24. Pt states her fasting blood sugar is usually between 99-125.  ?Pt to hold Rybelsus the day of surgery. Instructed her to check her blood sugar when she gets up day of surgery and every 2 hours until she leaves for the hospital. If blood sugar is 70 or below, treat with 1/2 cup of clear juice (apple or cranberry) and recheck blood sugar 15 minutes after drinking juice. If blood sugar continues to be 70 or below, call the Short Stay department and ask to speak to a nurse. Pt voiced understanding.  ? ?Shower instructions given to pt. ?
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
## 2022-02-27 ENCOUNTER — Ambulatory Visit: Payer: Self-pay | Admitting: Surgery

## 2022-03-02 ENCOUNTER — Ambulatory Visit (HOSPITAL_COMMUNITY): Payer: Medicare HMO | Admitting: Vascular Surgery

## 2022-03-02 ENCOUNTER — Encounter (HOSPITAL_COMMUNITY): Payer: Self-pay | Admitting: Surgery

## 2022-03-02 ENCOUNTER — Other Ambulatory Visit: Payer: Self-pay

## 2022-03-02 ENCOUNTER — Ambulatory Visit (HOSPITAL_COMMUNITY)
Admission: RE | Admit: 2022-03-02 | Discharge: 2022-03-02 | Disposition: A | Discharge status: Home / Self Care | Payer: Medicare HMO | Attending: Surgery | Admitting: Surgery

## 2022-03-02 ENCOUNTER — Encounter (HOSPITAL_COMMUNITY)
Admission: RE | Disposition: A | Discharge status: Home / Self Care | Payer: Self-pay | Source: Home / Self Care | Attending: Surgery

## 2022-03-02 ENCOUNTER — Ambulatory Visit (HOSPITAL_BASED_OUTPATIENT_CLINIC_OR_DEPARTMENT_OTHER): Payer: Medicare HMO | Admitting: Vascular Surgery

## 2022-03-02 DIAGNOSIS — K219 Gastro-esophageal reflux disease without esophagitis: Secondary | ICD-10-CM | POA: Diagnosis not present

## 2022-03-02 DIAGNOSIS — E119 Type 2 diabetes mellitus without complications: Secondary | ICD-10-CM | POA: Insufficient documentation

## 2022-03-02 DIAGNOSIS — Z89511 Acquired absence of right leg below knee: Secondary | ICD-10-CM | POA: Diagnosis not present

## 2022-03-02 DIAGNOSIS — I10 Essential (primary) hypertension: Secondary | ICD-10-CM

## 2022-03-02 DIAGNOSIS — D131 Benign neoplasm of stomach: Secondary | ICD-10-CM | POA: Diagnosis not present

## 2022-03-02 DIAGNOSIS — F32A Depression, unspecified: Secondary | ICD-10-CM | POA: Diagnosis not present

## 2022-03-02 DIAGNOSIS — Z452 Encounter for adjustment and management of vascular access device: Secondary | ICD-10-CM | POA: Diagnosis present

## 2022-03-02 DIAGNOSIS — Z7984 Long term (current) use of oral hypoglycemic drugs: Secondary | ICD-10-CM | POA: Diagnosis not present

## 2022-03-02 DIAGNOSIS — Z79899 Other long term (current) drug therapy: Secondary | ICD-10-CM | POA: Diagnosis not present

## 2022-03-02 DIAGNOSIS — F419 Anxiety disorder, unspecified: Secondary | ICD-10-CM | POA: Insufficient documentation

## 2022-03-02 DIAGNOSIS — Z794 Long term (current) use of insulin: Secondary | ICD-10-CM

## 2022-03-02 DIAGNOSIS — M1990 Unspecified osteoarthritis, unspecified site: Secondary | ICD-10-CM | POA: Insufficient documentation

## 2022-03-02 DIAGNOSIS — Z6841 Body Mass Index (BMI) 40.0 and over, adult: Secondary | ICD-10-CM | POA: Diagnosis not present

## 2022-03-02 DIAGNOSIS — J45909 Unspecified asthma, uncomplicated: Secondary | ICD-10-CM | POA: Diagnosis not present

## 2022-03-02 HISTORY — PX: PORT-A-CATH REMOVAL: SHX5289

## 2022-03-02 HISTORY — DX: Anxiety disorder, unspecified: F41.9

## 2022-03-02 LAB — CBC
HCT: 44.5 % (ref 36.0–46.0)
Hemoglobin: 14.7 g/dL (ref 12.0–15.0)
MCH: 30.4 pg (ref 26.0–34.0)
MCHC: 33 g/dL (ref 30.0–36.0)
MCV: 91.9 fL (ref 80.0–100.0)
Platelets: 236 10*3/uL (ref 150–400)
RBC: 4.84 MIL/uL (ref 3.87–5.11)
RDW: 13.8 % (ref 11.5–15.5)
WBC: 6.4 10*3/uL (ref 4.0–10.5)
nRBC: 0 % (ref 0.0–0.2)

## 2022-03-02 LAB — GLUCOSE, CAPILLARY
Glucose-Capillary: 146 mg/dL — ABNORMAL HIGH (ref 70–99)
Glucose-Capillary: 106 mg/dL — ABNORMAL HIGH (ref 70–99)

## 2022-03-02 LAB — BASIC METABOLIC PANEL
Anion gap: 12 (ref 5–15)
BUN: 16 mg/dL (ref 6–20)
CO2: 25 mmol/L (ref 22–32)
Calcium: 9.3 mg/dL (ref 8.9–10.3)
Chloride: 102 mmol/L (ref 98–111)
Creatinine, Ser: 1.04 mg/dL — ABNORMAL HIGH (ref 0.44–1.00)
GFR, Estimated: >60 mL/min (ref >60)
Glucose, Bld: 136 mg/dL — ABNORMAL HIGH (ref 70–99)
Potassium: 3.8 mmol/L (ref 3.5–5.1)
Sodium: 139 mmol/L (ref 135–145)

## 2022-03-02 SURGERY — REMOVAL PORT-A-CATH
Anesthesia: General | Site: Chest

## 2022-03-02 MED ORDER — FENTANYL CITRATE (PF) 250 MCG/5ML IJ SOLN
INTRAMUSCULAR | Status: AC
  Filled 2022-03-02: qty 5

## 2022-03-02 MED ORDER — DEXAMETHASONE SODIUM PHOSPHATE 10 MG/ML IJ SOLN
INTRAMUSCULAR | Status: DC | PRN
  Administered 2022-03-02: 10 mg via INTRAVENOUS

## 2022-03-02 MED ORDER — ORAL CARE MOUTH RINSE: 15.0000 mL | Freq: Once | OROMUCOSAL | Status: AC

## 2022-03-02 MED ORDER — 0.9 % SODIUM CHLORIDE (POUR BTL) OPTIME
TOPICAL | Status: DC | PRN
  Administered 2022-03-02: 1000 mL

## 2022-03-02 MED ORDER — CHLORHEXIDINE GLUCONATE 0.12 % MT SOLN
15.0000 mL | Freq: Once | OROMUCOSAL | Status: AC
  Administered 2022-03-02: 15 mL via OROMUCOSAL
  Filled 2022-03-02: qty 15

## 2022-03-02 MED ORDER — PHENYLEPHRINE 80 MCG/ML (10ML) SYRINGE FOR IV PUSH (FOR BLOOD PRESSURE SUPPORT)
PREFILLED_SYRINGE | INTRAVENOUS | Status: DC | PRN
  Administered 2022-03-02: 80 ug via INTRAVENOUS
  Administered 2022-03-02 (×2): 160 ug via INTRAVENOUS
  Administered 2022-03-02: 80 ug via INTRAVENOUS

## 2022-03-02 MED ORDER — ONDANSETRON HCL 4 MG/2ML IJ SOLN
INTRAMUSCULAR | Status: DC | PRN
  Administered 2022-03-02: 4 mg via INTRAVENOUS

## 2022-03-02 MED ORDER — FENTANYL CITRATE (PF) 250 MCG/5ML IJ SOLN
INTRAMUSCULAR | Status: DC | PRN
  Administered 2022-03-02: 100 ug via INTRAVENOUS

## 2022-03-02 MED ORDER — BUPIVACAINE HCL (PF) 0.25 % IJ SOLN
INTRAMUSCULAR | Status: AC
  Filled 2022-03-02: qty 30

## 2022-03-02 MED ORDER — MIDAZOLAM HCL 5 MG/5ML IJ SOLN
INTRAMUSCULAR | Status: DC | PRN
  Administered 2022-03-02: 2 mg via INTRAVENOUS

## 2022-03-02 MED ORDER — OXYCODONE HCL 5 MG PO TABS
5.0000 mg | ORAL_TABLET | Freq: Once | ORAL | Status: AC | PRN
  Administered 2022-03-02: 5 mg via ORAL

## 2022-03-02 MED ORDER — INSULIN ASPART 100 UNIT/ML IJ SOLN
INTRAMUSCULAR | Status: AC
  Administered 2022-03-02: 2 [IU] via SUBCUTANEOUS
  Filled 2022-03-02: qty 1

## 2022-03-02 MED ORDER — MIDAZOLAM HCL 2 MG/2ML IJ SOLN
INTRAMUSCULAR | Status: AC
  Filled 2022-03-02: qty 2

## 2022-03-02 MED ORDER — LIDOCAINE 2% (20 MG/ML) 5 ML SYRINGE
INTRAMUSCULAR | Status: DC | PRN
  Administered 2022-03-02: 60 mg via INTRAVENOUS

## 2022-03-02 MED ORDER — PROPOFOL 10 MG/ML IV BOLUS
INTRAVENOUS | Status: AC
  Filled 2022-03-02: qty 20

## 2022-03-02 MED ORDER — OXYCODONE HCL 5 MG/5ML PO SOLN: 5.0000 mg | Freq: Once | ORAL | Status: AC | PRN

## 2022-03-02 MED ORDER — PROPOFOL 10 MG/ML IV BOLUS
INTRAVENOUS | Status: DC | PRN
  Administered 2022-03-02: 140 mg via INTRAVENOUS
  Administered 2022-03-02 (×2): 20 mg via INTRAVENOUS

## 2022-03-02 MED ORDER — BUPIVACAINE HCL 0.25 % IJ SOLN
INTRAMUSCULAR | Status: DC | PRN
  Administered 2022-03-02: 4 mL

## 2022-03-02 MED ORDER — ONDANSETRON HCL 4 MG/2ML IJ SOLN: 4.0000 mg | Freq: Once | INTRAMUSCULAR | Status: DC | PRN

## 2022-03-02 MED ORDER — FENTANYL CITRATE (PF) 100 MCG/2ML IJ SOLN: 25.0000 ug | INTRAMUSCULAR | Status: DC | PRN

## 2022-03-02 MED ORDER — INSULIN ASPART 100 UNIT/ML IJ SOLN: 0.0000 [IU] | INTRAMUSCULAR | Status: DC | PRN

## 2022-03-02 MED ORDER — OXYCODONE HCL 5 MG PO TABS
ORAL_TABLET | ORAL | Status: AC
  Filled 2022-03-02: qty 1

## 2022-03-02 MED ORDER — LACTATED RINGERS IV SOLN: INTRAVENOUS | Status: DC

## 2022-03-02 SURGICAL SUPPLY — 33 items
BAG COUNTER SPONGE SURGICOUNT (BAG) ×2 IMPLANT
CHLORAPREP W/TINT 10.5 ML (MISCELLANEOUS) ×2 IMPLANT
COVER SURGICAL LIGHT HANDLE (MISCELLANEOUS) ×2 IMPLANT
DERMABOND ADVANCED (GAUZE/BANDAGES/DRESSINGS) ×1
DERMABOND ADVANCED .7 DNX12 (GAUZE/BANDAGES/DRESSINGS) IMPLANT
DRAPE LAPAROTOMY 100X72 PEDS (DRAPES) ×2 IMPLANT
ELECT REM PT RETURN 9FT ADLT (ELECTROSURGICAL) ×2
ELECTRODE REM PT RTRN 9FT ADLT (ELECTROSURGICAL) ×1 IMPLANT
GAUZE 4X4 16PLY ~~LOC~~+RFID DBL (SPONGE) ×2 IMPLANT
GLOVE BIO SURGEON STRL SZ7.5 (GLOVE) ×2 IMPLANT
GLOVE BIOGEL PI IND STRL 8 (GLOVE) ×1 IMPLANT
GLOVE BIOGEL PI INDICATOR 8 (GLOVE) ×1
GLOVE SURG SYN 7.5  E (GLOVE) ×1
GLOVE SURG SYN 7.5 E (GLOVE) ×1 IMPLANT
GLOVE SURG SYN 7.5 PF PI (GLOVE) ×1 IMPLANT
GOWN STRL REUS W/ TWL LRG LVL3 (GOWN DISPOSABLE) ×1 IMPLANT
GOWN STRL REUS W/ TWL XL LVL3 (GOWN DISPOSABLE) ×1 IMPLANT
GOWN STRL REUS W/TWL LRG LVL3 (GOWN DISPOSABLE) ×1
GOWN STRL REUS W/TWL XL LVL3 (GOWN DISPOSABLE) ×1
KIT BASIN OR (CUSTOM PROCEDURE TRAY) ×2 IMPLANT
KIT TURNOVER KIT B (KITS) ×2 IMPLANT
NDL HYPO 25GX1X1/2 BEV (NEEDLE) ×1 IMPLANT
NEEDLE HYPO 25GX1X1/2 BEV (NEEDLE) ×2 IMPLANT
NS IRRIG 1000ML POUR BTL (IV SOLUTION) ×2 IMPLANT
PACK GENERAL/GYN (CUSTOM PROCEDURE TRAY) ×2 IMPLANT
PAD ARMBOARD 7.5X6 YLW CONV (MISCELLANEOUS) ×4 IMPLANT
PENCIL SMOKE EVACUATOR (MISCELLANEOUS) ×2 IMPLANT
SUT MNCRL AB 3-0 PS2 18 (SUTURE) ×2 IMPLANT
SUT VIC AB 3-0 SH 27 (SUTURE) ×1
SUT VIC AB 3-0 SH 27X BRD (SUTURE) ×1 IMPLANT
SYR CONTROL 10ML LL (SYRINGE) ×2 IMPLANT
TOWEL GREEN STERILE (TOWEL DISPOSABLE) ×2 IMPLANT
TOWEL GREEN STERILE FF (TOWEL DISPOSABLE) ×2 IMPLANT
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
## 2022-03-02 NOTE — Op Note (Signed)
Date: 03/02/22 ? ?Patient: Joanna [name] ?MRN: [id number] ? ?Preoperative Diagnosis: Portacath in place ?Postoperative Diagnosis: Same ? ?Procedure: Portacath removal ? ?Surgeon: Michaelle Birks, MD ? ?EBL: Minimal ? ?Anesthesia: General LMA ? ?Specimens: None ? ?Indications: Ms. Ginsberg is a 56 yo female who had a right subclavian port placed on 01/05/22. She no longer needs the port and presents today for removal. ? ?Findings: Port-a-cath removed with tip in tact, catheter 15cm in length. ? ?Procedure details: Informed consent was obtained in the preoperative area prior to the procedure. The patient was brought to the operating room and placed on the table in the supine position. General anesthesia was induced and appropriate lines and drains were placed for intraoperative monitoring. The right chest was prepped and draped in the usual sterile fashion. A pre-procedure timeout was taken verifying patient identity, surgical site and procedure to be performed. ? ?The previous scar overlying the port was sharply excised. The subcutaneous tissue was divided with cautery and the capsule surrounding the port was opened. The port was grasped with a Kocher clamp, and the three Prolene sutures anchoring the port in place were cut and removed. The patient was placed in Trendelenburg and the port and attached catheter were removed. The catheter tip was inspected and appeared in tact, at a length of 15cm. The wound was irrigated and appeared hemostatic. The port capsule was fulgurated with cautery. The subcutaneous tissue was closed with interrupted 3-0 Vicryl suture. The skin was closed with a running subcuticular 4-0 monocryl suture and dermabond was applied. ? ?The patient tolerated the procedure well with no apparent complications. All counts were correct x2 at the end of the procedure. The patient was extubated and taken to PACU in stable condition. ? ?Michaelle Birks, MD ?03/02/22 ?10:51 AM ? ? ?

## 2022-03-02 NOTE — Discharge Instructions (Signed)
[facility] DISCHARGE INSTRUCTIONS ? ?Activity ?Ok to shower in 24 hours, but do not bathe or submerge incisions underwater. ?You may otherwise resume your regular activities as tolerated. ? ?Wound Care ?Your incisions are covered with skin glue called Dermabond. This will peel off on its own over time. ?You may shower and allow warm soapy water to run over your incisions. Gently pat dry. ?Do not submerge your incision underwater. ?Monitor your incision for any new redness, tenderness, or drainage. ? ?When to Call us: ?Fever greater than 100.5 ?New redness, drainage, or swelling at incision site ?Severe pain, nausea, or vomiting ? ?For questions or concerns, please call the office at [phone].  ?

## 2022-03-02 NOTE — Anesthesia Procedure Notes (Signed)
Procedure Name: Intubation ?Date/Time: 03/02/2022 10:07 AM ?Performed by: Vonna Drafts, CRNA ?Pre-anesthesia Checklist: Patient identified, Emergency Drugs available, Suction available and Patient being monitored ?Patient Re-evaluated:Patient Re-evaluated prior to induction ?Oxygen Delivery Method: Circle system utilized ?Preoxygenation: Pre-oxygenation with 100% oxygen ?Induction Type: IV induction ?LMA: LMA inserted ?LMA Size: 4.0 ?Tube type: Oral ?Number of attempts: 1 ?Airway Equipment and Method: Stylet ?Placement Confirmation: positive ETCO2 and breath sounds checked- equal and bilateral ?Tube secured with: Tape ?Dental Injury: Teeth and Oropharynx as per pre-operative assessment  ? ? ? ? ?

## 2022-03-02 NOTE — Transfer of Care (Signed)
Immediate Anesthesia Transfer of Care Note ? ?Patient: Joanna [name] ? ?Procedure(s) Performed: REMOVAL PORT-A-CATH (Chest) ? ?Patient Location: PACU ? ?Anesthesia Type:General ? ?Level of Consciousness: drowsy ? ?Airway & Oxygen Therapy: Patient Spontanous Breathing ? ?Post-op Assessment: Report given to RN and Post -op Vital signs reviewed and stable ? ?Post vital signs: Reviewed and stable ? ?Last Vitals:  ?Vitals Value Taken Time  ?BP    ?Temp    ?Pulse 79 03/02/22 1039  ?Resp    ?SpO2 89 % 03/02/22 1039  ?Vitals shown include unvalidated device data. ? ?Last Pain:  ?Vitals:  ? 03/02/22 0905  ?TempSrc:   ?PainSc: 4   ?   ? ?  ? ?Complications: No notable events documented. ?

## 2022-03-02 NOTE — Anesthesia Postprocedure Evaluation (Signed)
Anesthesia Post Note ? ?Patient: Joanna [name] ? ?Procedure(s) Performed: REMOVAL PORT-A-CATH (Chest) ? ?  ? ?Patient location during evaluation: PACU ?Anesthesia Type: General ?Level of consciousness: awake and alert and oriented ?Pain management: pain level controlled ?Vital Signs Assessment: post-procedure vital signs reviewed and stable ?Respiratory status: spontaneous breathing, nonlabored ventilation and respiratory function stable ?Cardiovascular status: blood pressure returned to baseline and stable ?Postop Assessment: no apparent nausea or vomiting ?Anesthetic complications: no ? ? ?No notable events documented. ? ?Last Vitals:  ?Vitals:  ? 03/02/22 1054 03/02/22 1107  ?BP: 122/76 130/82  ?Pulse: 73 66  ?Resp: 16 19  ?Temp:  36.6 ?C  ?SpO2: 95% 94%  ?  ?Last Pain:  ?Vitals:  ? 03/02/22 1107  ?TempSrc:   ?PainSc: 0-No pain  ? ? ?  ?  ?  ?  ?  ?  ? ?Devony Mcgrady A. ? ? ? ? ?

## 2022-03-03 ENCOUNTER — Encounter (HOSPITAL_COMMUNITY): Payer: Self-pay | Admitting: Surgery

## 2022-03-06 ENCOUNTER — Ambulatory Visit (INDEPENDENT_AMBULATORY_CARE_PROVIDER_SITE_OTHER): Payer: Medicare HMO

## 2022-03-06 DIAGNOSIS — J309 Allergic rhinitis, unspecified: Secondary | ICD-10-CM | POA: Diagnosis not present

## 2022-03-11 ENCOUNTER — Other Ambulatory Visit: Payer: Self-pay | Admitting: Internal Medicine

## 2022-03-11 DIAGNOSIS — J4550 Severe persistent asthma, uncomplicated: Secondary | ICD-10-CM

## 2022-03-12 ENCOUNTER — Ambulatory Visit (INDEPENDENT_AMBULATORY_CARE_PROVIDER_SITE_OTHER): Payer: Medicare HMO

## 2022-03-12 DIAGNOSIS — J309 Allergic rhinitis, unspecified: Secondary | ICD-10-CM | POA: Diagnosis not present

## 2022-03-18 ENCOUNTER — Ambulatory Visit (INDEPENDENT_AMBULATORY_CARE_PROVIDER_SITE_OTHER): Payer: Medicare HMO

## 2022-03-18 DIAGNOSIS — J309 Allergic rhinitis, unspecified: Secondary | ICD-10-CM | POA: Diagnosis not present

## 2022-03-26 ENCOUNTER — Ambulatory Visit (INDEPENDENT_AMBULATORY_CARE_PROVIDER_SITE_OTHER): Payer: Medicare HMO

## 2022-03-26 ENCOUNTER — Ambulatory Visit: Payer: Medicare HMO | Admitting: Nurse Practitioner

## 2022-03-26 ENCOUNTER — Encounter: Payer: Self-pay | Admitting: *Deleted

## 2022-03-26 VITALS — BP 104/68 | HR 76 | Ht 60.5 in | Wt 238.2 lb

## 2022-03-26 DIAGNOSIS — K219 Gastro-esophageal reflux disease without esophagitis: Secondary | ICD-10-CM | POA: Diagnosis not present

## 2022-03-26 DIAGNOSIS — J309 Allergic rhinitis, unspecified: Secondary | ICD-10-CM | POA: Diagnosis not present

## 2022-03-26 MED ORDER — SUCRALFATE 1 GM/10ML PO SUSP: ORAL | 1 refills | Status: DC

## 2022-03-26 NOTE — Patient Instructions (Addendum)
We have sent the following medications to your pharmacy for you to pick up at your convenience: Carafate suspension- 10 ml by mouth three times daily (before lunch, before dinner, before bedtime).  You have been scheduled for a follow up with Dr Rush Landmark on 05/13/22 at 9:50 am  If you are age 58 or older, your body mass index should be between 23-30. Your Body mass index is 45.76 kg/m. If this is out of the aforementioned range listed, please consider follow up with your Primary Care Provider.  If you are age 70 or younger, your body mass index should be between 19-25. Your Body mass index is 45.76 kg/m. If this is out of the aformentioned range listed, please consider follow up with your Primary Care Provider.   ________________________________________________________  The [place] [facility] providers would like to encourage you to use [facility] to communicate with providers for non-urgent requests or questions.  Due to long hold times on the telephone, sending your provider a message by [facility] may be a faster and more efficient way to get a response.  Please allow 48 business hours for a response.  Please remember that this is for non-urgent requests.  _______________________________________________________  Due to recent changes in healthcare laws, you may see the results of your imaging and laboratory studies on MyChart before your provider has had a chance to review them.  We understand that in some cases there may be results that are confusing or concerning to you. Not all laboratory results come back in the same time frame and the provider may be waiting for multiple results in order to interpret others.  Please give [place] 48 hours in order for your provider to thoroughly review all the results before contacting the office for clarification of your results.
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
## 2022-03-27 ENCOUNTER — Other Ambulatory Visit: Payer: Self-pay | Admitting: *Deleted

## 2022-03-27 MED ORDER — FLUTICASONE PROPIONATE 50 MCG/ACT NA SUSP: 2.0000 | Freq: Every day | NASAL | 3 refills | Status: DC

## 2022-03-27 NOTE — Telephone Encounter (Signed)
Gabbi called requesting 90 day refill of fluticasone nasal spray [facility]. Rx sent.

## 2022-03-30 ENCOUNTER — Encounter: Payer: Self-pay | Admitting: Nurse Practitioner
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
## 2022-04-01 NOTE — Progress Notes (Signed)
Attending Physician's Attestation   I have reviewed the chart.   I agree with the Advanced Practitioner's note, impression, and recommendations with any updates as below. Reasonable approach.  At follow-up, if patient still having issues then we will plan to proceed with scheduling colonoscopy as well as EGD.  She will need to have these procedures at the hospital-based setting.   Justice Britain, MD [facility] Advanced Endoscopy Office # [phone]

## 2022-04-02 ENCOUNTER — Ambulatory Visit (HOSPITAL_BASED_OUTPATIENT_CLINIC_OR_DEPARTMENT_OTHER)
Admission: RE | Admit: 2022-04-02 | Discharge: 2022-04-02 | Disposition: A | Discharge status: Home / Self Care | Payer: Medicare HMO | Source: Ambulatory Visit | Attending: Internal Medicine | Admitting: Internal Medicine

## 2022-04-02 ENCOUNTER — Ambulatory Visit: Payer: Medicare HMO | Admitting: Internal Medicine

## 2022-04-02 ENCOUNTER — Other Ambulatory Visit: Payer: Self-pay | Admitting: Internal Medicine

## 2022-04-02 ENCOUNTER — Encounter: Payer: Self-pay | Admitting: Internal Medicine

## 2022-04-02 VITALS — BP 104/60 | HR 85 | Temp 97.9°F

## 2022-04-02 DIAGNOSIS — R051 Acute cough: Secondary | ICD-10-CM | POA: Diagnosis not present

## 2022-04-02 DIAGNOSIS — R042 Hemoptysis: Secondary | ICD-10-CM

## 2022-04-02 DIAGNOSIS — R0602 Shortness of breath: Secondary | ICD-10-CM | POA: Insufficient documentation

## 2022-04-02 IMAGING — DX DG CHEST 2V
2 series · 2 of 2 positions shown · non-contrast
Comparison: Chest x-ray [DATE]

CLINICAL DATA: Cough.

EXAM:
CHEST - 2 VIEW

[chest pa]
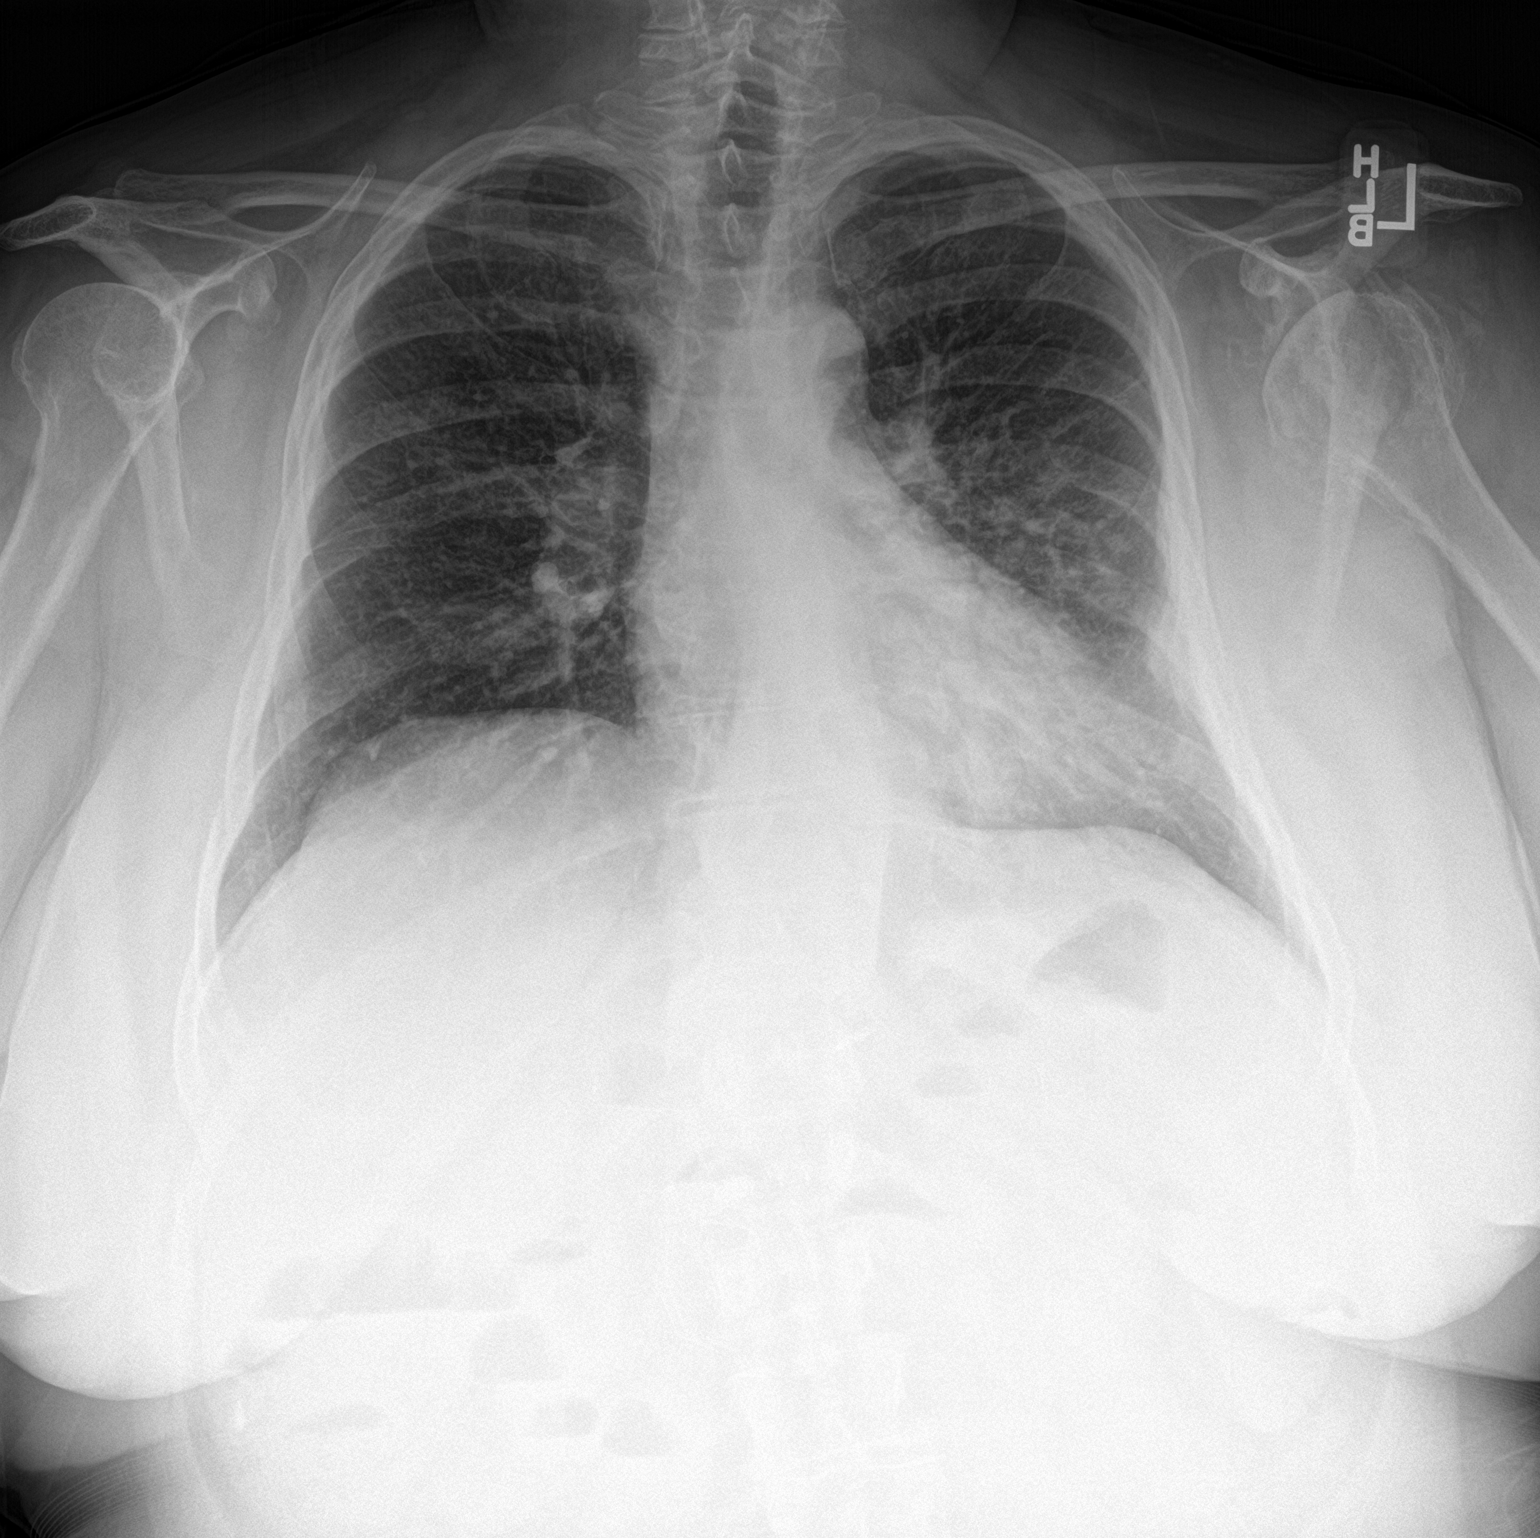

[chest lat]
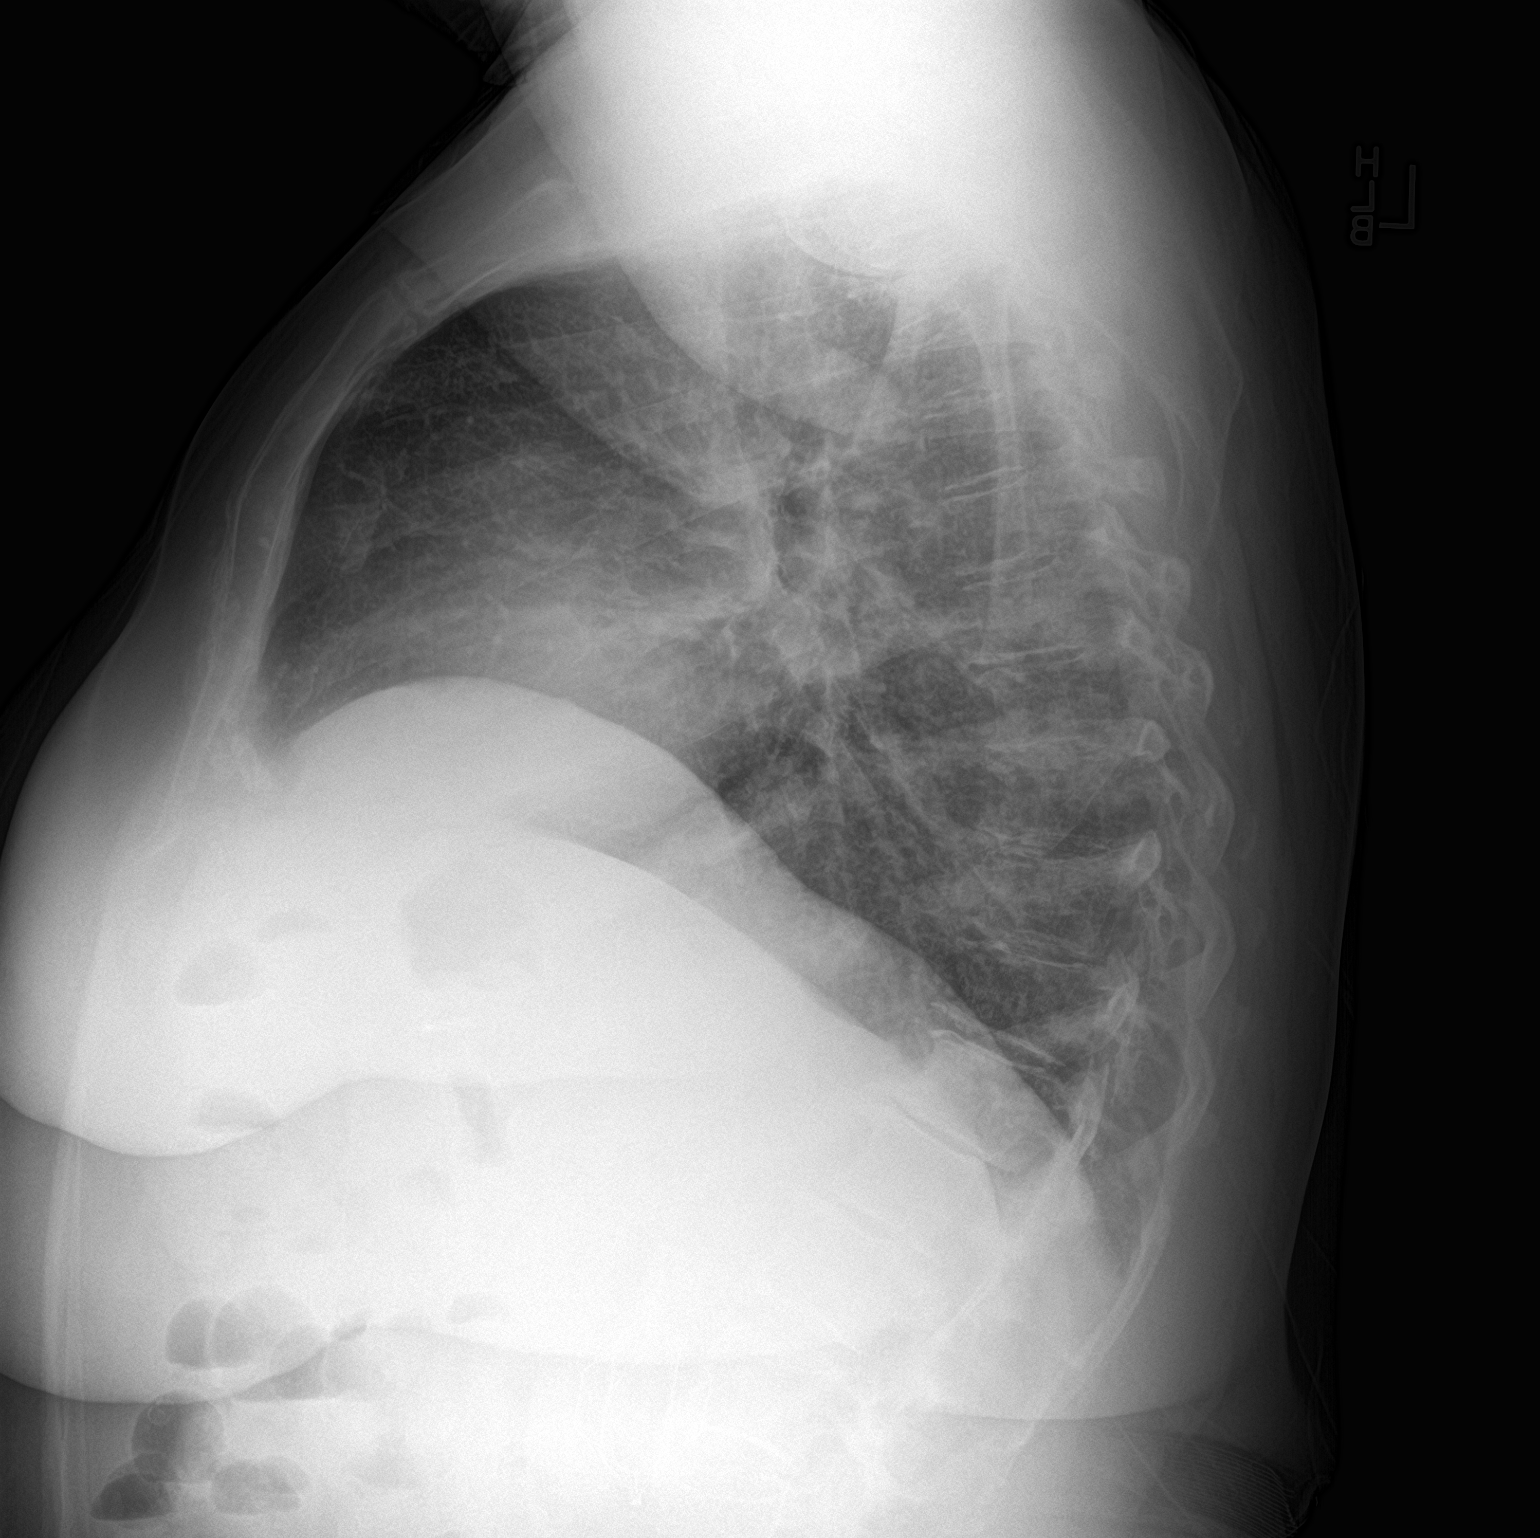

[2 of 2 positions shown; findings below may reference images not displayed]

FINDINGS: Patchy airspace and reticulonodular opacities are seen in the left
mid lung. No pleural effusion or pneumothorax. Cardiomediastinal
silhouette is within normal limits. Healed left-sided rib fractures
with adjacent pleural thickening appear unchanged. There are severe
degenerative changes of the left shoulder.
IMPRESSION: Patchy airspace and reticulonodular opacities in the left mid lung
worrisome for infection. Follow-up chest x-ray recommended in 4-6
weeks to confirm resolution.

## 2022-04-02 MED ORDER — CEFDINIR 300 MG PO CAPS: 300.0000 mg | ORAL_CAPSULE | Freq: Two times a day (BID) | ORAL | 0 refills | Status: AC

## 2022-04-02 NOTE — Patient Instructions (Addendum)
Acute cough with fever and bloody streaks in sputum:  - rapid covid - Negative - labs: D-dimer, CBCd - CXR - once results are available, will call to discuss results and next steps  Continue all current meds.  Can use albuterol scheduled every 4-6 hours while awake for the next 2-3 days for shortness of breath.    We will call you once results are available.
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
## 2022-04-03 LAB — CBC WITH DIFFERENTIAL/PLATELET
Basophils Absolute: 0 10*3/uL (ref 0.0–0.2)
Basos: 0 %
EOS (ABSOLUTE): 0 10*3/uL (ref 0.0–0.4)
Eos: 0 %
Hematocrit: 39 % (ref 34.0–46.6)
Hemoglobin: 13.4 g/dL (ref 11.1–15.9)
Immature Grans (Abs): 0.1 10*3/uL (ref 0.0–0.1)
Immature Granulocytes: 1 %
Lymphocytes Absolute: 0.9 10*3/uL (ref 0.7–3.1)
Lymphs: 5 %
MCH: 30.3 pg (ref 26.6–33.0)
MCHC: 34.4 g/dL (ref 31.5–35.7)
MCV: 88 fL (ref 79–97)
Monocytes Absolute: 0.7 10*3/uL (ref 0.1–0.9)
Monocytes: 5 %
Neutrophils Absolute: 14 10*3/uL — ABNORMAL HIGH (ref 1.4–7.0)
Neutrophils: 89 %
Platelets: 230 10*3/uL (ref 150–450)
RBC: 4.42 x10E6/uL (ref 3.77–5.28)
RDW: 13.6 % (ref 11.7–15.4)
WBC: 15.7 10*3/uL — ABNORMAL HIGH (ref 3.4–10.8)

## 2022-04-03 LAB — D-DIMER, QUANTITATIVE: D-DIMER: 2.06 mg/L FEU — ABNORMAL HIGH (ref 0.00–0.49)
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
## 2022-04-03 NOTE — Progress Notes (Signed)
Please let Joanna [name] know that her d-dimer is back and is quite elevated. I'm not sure what her baseline is, but she does have risk factors for PE and was experiencing SOB yesterday which seemed to have improved yesterday evening, but I think it would be best she present to ED to have PE specific imaging based on her symptoms, labs and risk factors.  I have called her personally and left a message to call us back.  Message went straight to VM.

## 2022-04-10 ENCOUNTER — Ambulatory Visit: Payer: Medicare HMO | Admitting: Internal Medicine

## 2022-04-13 ENCOUNTER — Telehealth: Payer: Self-pay

## 2022-04-13 ENCOUNTER — Other Ambulatory Visit: Payer: Self-pay

## 2022-04-13 MED ORDER — LEVOFLOXACIN 750 MG PO TABS: 750.0000 mg | ORAL_TABLET | Freq: Every day | ORAL | 0 refills | Status: DC

## 2022-04-13 NOTE — Telephone Encounter (Signed)
Pt started running a fever Friday, vomiting and chills on Friday, feels a little better today. She is doing tylenol for fever. But Saturday she was at 98.2. Fever was highest at 102.50forally. Breathing is ok she is coughing up green stuff and was wondering if the antibiotic is not strong enough? Saturday she had her last antibiotic. She was wondering if the anitbiotic was strong enough or not. She is due for fasnera tomorrow and was wondering if she should get it

## 2022-04-13 NOTE — Telephone Encounter (Signed)
Sent in rx and informed pt she stated understanding

## 2022-04-14 ENCOUNTER — Ambulatory Visit: Payer: Medicare HMO

## 2022-04-23 ENCOUNTER — Ambulatory Visit: Payer: Medicare HMO

## 2022-04-24 ENCOUNTER — Other Ambulatory Visit: Payer: Self-pay

## 2022-05-04 ENCOUNTER — Ambulatory Visit (INDEPENDENT_AMBULATORY_CARE_PROVIDER_SITE_OTHER): Payer: Medicare HMO

## 2022-05-04 DIAGNOSIS — J309 Allergic rhinitis, unspecified: Secondary | ICD-10-CM

## 2022-05-06 ENCOUNTER — Telehealth: Payer: Self-pay | Admitting: Family

## 2022-05-06 ENCOUNTER — Ambulatory Visit (INDEPENDENT_AMBULATORY_CARE_PROVIDER_SITE_OTHER): Payer: Medicare HMO

## 2022-05-06 DIAGNOSIS — J4550 Severe persistent asthma, uncomplicated: Secondary | ICD-10-CM

## 2022-05-06 NOTE — Telephone Encounter (Signed)
Needs an appt, not seen since August 2022. Per insurance they have to have an appt w/I 6 mos of new Rx.

## 2022-05-06 NOTE — Telephone Encounter (Signed)
Pt called requesting an order for supplies for her prostatic leg to [facility]. Please call pt at [phone]

## 2022-05-08 NOTE — Telephone Encounter (Signed)
Pt informed. Scheduled on 05/22/22

## 2022-05-13 ENCOUNTER — Ambulatory Visit (INDEPENDENT_AMBULATORY_CARE_PROVIDER_SITE_OTHER): Payer: Medicare HMO

## 2022-05-13 ENCOUNTER — Encounter: Payer: Self-pay | Admitting: Gastroenterology

## 2022-05-13 ENCOUNTER — Ambulatory Visit: Payer: Medicare HMO | Admitting: Gastroenterology

## 2022-05-13 VITALS — BP 102/64 | HR 86 | Ht 63.0 in | Wt 215.8 lb

## 2022-05-13 DIAGNOSIS — D369 Benign neoplasm, unspecified site: Secondary | ICD-10-CM | POA: Insufficient documentation

## 2022-05-13 DIAGNOSIS — J309 Allergic rhinitis, unspecified: Secondary | ICD-10-CM | POA: Diagnosis not present

## 2022-05-13 DIAGNOSIS — K219 Gastro-esophageal reflux disease without esophagitis: Secondary | ICD-10-CM | POA: Diagnosis not present

## 2022-05-13 DIAGNOSIS — R634 Abnormal weight loss: Secondary | ICD-10-CM

## 2022-05-13 DIAGNOSIS — R194 Change in bowel habit: Secondary | ICD-10-CM

## 2022-05-13 DIAGNOSIS — R140 Abdominal distension (gaseous): Secondary | ICD-10-CM | POA: Insufficient documentation

## 2022-05-13 MED ORDER — SUCRALFATE 1 GM/10ML PO SUSP: ORAL | 6 refills | Status: DC
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
## 2022-05-13 NOTE — Patient Instructions (Addendum)
We have sent the following medications to your pharmacy for you to pick up at your convenience: sucralfate.  Continue probiotics.   We have moved your recall procedures to September. We will make sure your bloating is resolved before scheduling.   The [place] [facility] providers would like to encourage you to use [facility] to communicate with providers for non-urgent requests or questions.  Due to long hold times on the telephone, sending your provider a message by [facility] may be a faster and more efficient way to get a response.  Please allow 48 business hours for a response.  Please remember that this is for non-urgent requests.   Please follow up with Tye Savoy, NP on 06/19/22 at 9:00am.

## 2022-05-15 DIAGNOSIS — R194 Change in bowel habit: Secondary | ICD-10-CM | POA: Insufficient documentation

## 2022-05-18 ENCOUNTER — Other Ambulatory Visit: Payer: Self-pay

## 2022-05-22 ENCOUNTER — Ambulatory Visit: Payer: Medicare HMO | Admitting: Family

## 2022-05-26 ENCOUNTER — Other Ambulatory Visit: Payer: Self-pay

## 2022-06-09 ENCOUNTER — Other Ambulatory Visit: Payer: Self-pay

## 2022-06-09 ENCOUNTER — Ambulatory Visit (INDEPENDENT_AMBULATORY_CARE_PROVIDER_SITE_OTHER): Payer: Medicare HMO

## 2022-06-09 DIAGNOSIS — J309 Allergic rhinitis, unspecified: Secondary | ICD-10-CM | POA: Diagnosis not present

## 2022-06-09 MED ORDER — ALBUTEROL SULFATE (2.5 MG/3ML) 0.083% IN NEBU: 2.5000 mg | INHALATION_SOLUTION | RESPIRATORY_TRACT | 1 refills | Status: DC | PRN

## 2022-06-09 NOTE — Telephone Encounter (Signed)
Refill sent in for albuterol 0.083% x 1 with 1 refill at [facility]

## 2022-06-11 NOTE — Progress Notes (Signed)
VIALS EXP 06-12-23

## 2022-06-12 DIAGNOSIS — J3089 Other allergic rhinitis: Secondary | ICD-10-CM | POA: Diagnosis not present

## 2022-06-15 DIAGNOSIS — J3081 Allergic rhinitis due to animal (cat) (dog) hair and dander: Secondary | ICD-10-CM | POA: Diagnosis not present

## 2022-06-19 ENCOUNTER — Ambulatory Visit: Payer: Medicare HMO | Admitting: Nurse Practitioner

## 2022-07-01 ENCOUNTER — Ambulatory Visit (INDEPENDENT_AMBULATORY_CARE_PROVIDER_SITE_OTHER): Payer: Medicare HMO

## 2022-07-01 DIAGNOSIS — J4550 Severe persistent asthma, uncomplicated: Secondary | ICD-10-CM

## 2022-07-02 ENCOUNTER — Other Ambulatory Visit: Payer: Self-pay

## 2022-07-06 ENCOUNTER — Ambulatory Visit (INDEPENDENT_AMBULATORY_CARE_PROVIDER_SITE_OTHER): Payer: Medicare HMO

## 2022-07-06 DIAGNOSIS — J309 Allergic rhinitis, unspecified: Secondary | ICD-10-CM | POA: Diagnosis not present

## 2022-07-13 ENCOUNTER — Other Ambulatory Visit: Payer: Self-pay | Admitting: Allergy & Immunology

## 2022-08-03 ENCOUNTER — Ambulatory Visit (INDEPENDENT_AMBULATORY_CARE_PROVIDER_SITE_OTHER): Payer: Medicare HMO

## 2022-08-03 DIAGNOSIS — J309 Allergic rhinitis, unspecified: Secondary | ICD-10-CM

## 2022-08-26 ENCOUNTER — Ambulatory Visit: Payer: Medicare HMO

## 2022-08-27 ENCOUNTER — Other Ambulatory Visit: Payer: Self-pay

## 2022-08-28 ENCOUNTER — Ambulatory Visit (INDEPENDENT_AMBULATORY_CARE_PROVIDER_SITE_OTHER): Payer: Medicare HMO

## 2022-08-28 DIAGNOSIS — J4550 Severe persistent asthma, uncomplicated: Secondary | ICD-10-CM

## 2022-08-29 ENCOUNTER — Other Ambulatory Visit: Payer: Self-pay

## 2022-08-31 ENCOUNTER — Ambulatory Visit (INDEPENDENT_AMBULATORY_CARE_PROVIDER_SITE_OTHER): Payer: Medicare HMO

## 2022-08-31 DIAGNOSIS — J309 Allergic rhinitis, unspecified: Secondary | ICD-10-CM

## 2022-09-07 ENCOUNTER — Ambulatory Visit (INDEPENDENT_AMBULATORY_CARE_PROVIDER_SITE_OTHER): Payer: Medicare HMO

## 2022-09-07 DIAGNOSIS — J309 Allergic rhinitis, unspecified: Secondary | ICD-10-CM

## 2022-09-14 ENCOUNTER — Ambulatory Visit (INDEPENDENT_AMBULATORY_CARE_PROVIDER_SITE_OTHER): Payer: Medicare HMO

## 2022-09-14 DIAGNOSIS — J309 Allergic rhinitis, unspecified: Secondary | ICD-10-CM

## 2022-09-23 ENCOUNTER — Ambulatory Visit (INDEPENDENT_AMBULATORY_CARE_PROVIDER_SITE_OTHER): Payer: Medicare HMO

## 2022-09-23 DIAGNOSIS — J309 Allergic rhinitis, unspecified: Secondary | ICD-10-CM

## 2022-09-30 ENCOUNTER — Ambulatory Visit (INDEPENDENT_AMBULATORY_CARE_PROVIDER_SITE_OTHER): Payer: Medicare HMO

## 2022-09-30 DIAGNOSIS — J309 Allergic rhinitis, unspecified: Secondary | ICD-10-CM

## 2022-10-23 ENCOUNTER — Ambulatory Visit (INDEPENDENT_AMBULATORY_CARE_PROVIDER_SITE_OTHER): Payer: Medicare HMO

## 2022-10-23 DIAGNOSIS — J4550 Severe persistent asthma, uncomplicated: Secondary | ICD-10-CM | POA: Diagnosis not present

## 2022-10-29 ENCOUNTER — Ambulatory Visit (INDEPENDENT_AMBULATORY_CARE_PROVIDER_SITE_OTHER): Payer: Medicare HMO

## 2022-10-29 DIAGNOSIS — J309 Allergic rhinitis, unspecified: Secondary | ICD-10-CM | POA: Diagnosis not present

## 2022-11-16 DIAGNOSIS — J3089 Other allergic rhinitis: Secondary | ICD-10-CM | POA: Diagnosis not present

## 2022-11-16 NOTE — Progress Notes (Signed)
VIALS EXP 11-17-23

## 2022-11-17 DIAGNOSIS — J3081 Allergic rhinitis due to animal (cat) (dog) hair and dander: Secondary | ICD-10-CM | POA: Diagnosis not present

## 2022-11-27 ENCOUNTER — Ambulatory Visit (INDEPENDENT_AMBULATORY_CARE_PROVIDER_SITE_OTHER): Payer: Medicare HMO

## 2022-11-27 DIAGNOSIS — J309 Allergic rhinitis, unspecified: Secondary | ICD-10-CM | POA: Diagnosis not present

## 2022-12-03 ENCOUNTER — Other Ambulatory Visit: Payer: Self-pay | Admitting: Allergy & Immunology

## 2022-12-17 ENCOUNTER — Ambulatory Visit (INDEPENDENT_AMBULATORY_CARE_PROVIDER_SITE_OTHER): Payer: Medicare HMO

## 2022-12-17 DIAGNOSIS — J309 Allergic rhinitis, unspecified: Secondary | ICD-10-CM

## 2022-12-21 ENCOUNTER — Ambulatory Visit (INDEPENDENT_AMBULATORY_CARE_PROVIDER_SITE_OTHER): Payer: Medicare HMO

## 2022-12-21 DIAGNOSIS — J309 Allergic rhinitis, unspecified: Secondary | ICD-10-CM

## 2022-12-21 DIAGNOSIS — J4550 Severe persistent asthma, uncomplicated: Secondary | ICD-10-CM

## 2023-01-07 ENCOUNTER — Telehealth: Payer: Self-pay | Admitting: *Deleted

## 2023-01-07 NOTE — Telephone Encounter (Signed)
L/m for patient to contact clinic for MD appt for Fasenra reapproval ?
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
## 2023-01-11 ENCOUNTER — Other Ambulatory Visit: Payer: Self-pay

## 2023-01-11 ENCOUNTER — Ambulatory Visit: Payer: Medicare HMO | Admitting: Internal Medicine

## 2023-01-11 ENCOUNTER — Encounter: Payer: Self-pay | Admitting: Internal Medicine

## 2023-01-11 VITALS — BP 122/76 | HR 61 | Temp 97.9°F | Resp 20 | Wt 163.7 lb

## 2023-01-11 DIAGNOSIS — J302 Other seasonal allergic rhinitis: Secondary | ICD-10-CM

## 2023-01-11 DIAGNOSIS — J309 Allergic rhinitis, unspecified: Secondary | ICD-10-CM | POA: Diagnosis not present

## 2023-01-11 DIAGNOSIS — K219 Gastro-esophageal reflux disease without esophagitis: Secondary | ICD-10-CM | POA: Diagnosis not present

## 2023-01-11 DIAGNOSIS — J4550 Severe persistent asthma, uncomplicated: Secondary | ICD-10-CM | POA: Diagnosis not present

## 2023-01-11 DIAGNOSIS — T7800XD Anaphylactic reaction due to unspecified food, subsequent encounter: Secondary | ICD-10-CM

## 2023-01-11 MED ORDER — FLUTICASONE PROPIONATE 50 MCG/ACT NA SUSP: 2.0000 | Freq: Every day | NASAL | 3 refills | Status: DC

## 2023-01-11 MED ORDER — EPINEPHRINE 0.3 MG/0.3ML IJ SOAJ: 0.3000 mg | INTRAMUSCULAR | 1 refills | Status: DC | PRN

## 2023-01-11 MED ORDER — MONTELUKAST SODIUM 10 MG PO TABS: 10.0000 mg | ORAL_TABLET | Freq: Every day | ORAL | 3 refills | Status: DC

## 2023-01-11 MED ORDER — BREZTRI AEROSPHERE 160-9-4.8 MCG/ACT IN AERO: INHALATION_SPRAY | RESPIRATORY_TRACT | 3 refills | Status: DC

## 2023-01-11 NOTE — Patient Instructions (Addendum)
Asthma Continue montelukast 10 mg nightly Continue Breztri 2 puffs twice a day with a spacer to prevent cough or wheeze Continue albuterol 2 puffs every 4 hours as needed for cough or wheeze OR Instead use albuterol 0.083% solution via nebulizer one unit vial every 4 hours as needed for cough or wheeze You may use albuterol 2 puffs 5 to 15 minutes before activity to decrease cough or wheeze Continue fasenra per protocol.  Allergic rhinitis Continue allergen avoidance measures directed toward pollen, mold, dust mite, cat, and dog as listed below Continue allergen immunotherapy and have access to an epinephrine autoinjector set You may take an antihistamine once a day as needed for a runny nose or itch. Remember to rotate to a different antihistamine about every 3 months. Some examples of over the counter antihistamines include Zyrtec (cetirizine), Xyzal (levocetirizine), Allegra (fexofenadine), and Claritin (loratidine). Continue Flonase 2 sprays in each nostril once a day as needed for a stuffy nose. In the right nostril, point the applicator out toward the right ear. In the left nostril, point the applicator out toward the left ear Consider saline nasal rinses as needed for nasal symptoms. Use this before any medicated nasal sprays for best result  Reflux Continue dietary modifications as listed below Continue Dexilant 60 mg twice a day and follow-up with your gastroenterologist as recommended  Food allergy Continue to avoid shellfish.  In case of an allergic reaction, take Benadryl 50 mg every 4 hours, and if life-threatening symptoms occur, inject with EpiPen 0.3 mg.  Stinging insect allergy Continue to avoid stinging insects. In case of an allergic reaction, take Benadryl 50 mg every 4 hours, and if life-threatening symptoms occur, inject with EpiPen 0.3 mg.  Call the clinic if this treatment plan is not working well for you.  Follow up in 12 months or sooner if needed. It was so great  seeing you today! You look amazing!

## 2023-01-25 DIAGNOSIS — J3089 Other allergic rhinitis: Secondary | ICD-10-CM

## 2023-01-25 NOTE — Progress Notes (Signed)
EXP 01/25/24

## 2023-01-26 DIAGNOSIS — J3081 Allergic rhinitis due to animal (cat) (dog) hair and dander: Secondary | ICD-10-CM | POA: Diagnosis not present

## 2023-02-08 ENCOUNTER — Ambulatory Visit (INDEPENDENT_AMBULATORY_CARE_PROVIDER_SITE_OTHER): Payer: Medicare HMO

## 2023-02-08 DIAGNOSIS — J309 Allergic rhinitis, unspecified: Secondary | ICD-10-CM

## 2023-02-15 ENCOUNTER — Ambulatory Visit (INDEPENDENT_AMBULATORY_CARE_PROVIDER_SITE_OTHER): Payer: Medicare HMO

## 2023-02-15 DIAGNOSIS — J4550 Severe persistent asthma, uncomplicated: Secondary | ICD-10-CM

## 2023-02-16 ENCOUNTER — Ambulatory Visit: Payer: Medicare HMO | Admitting: Gastroenterology

## 2023-02-16 ENCOUNTER — Encounter: Payer: Self-pay | Admitting: Gastroenterology

## 2023-02-16 VITALS — BP 126/86 | HR 67 | Ht 63.0 in | Wt 159.6 lb

## 2023-02-16 DIAGNOSIS — K219 Gastro-esophageal reflux disease without esophagitis: Secondary | ICD-10-CM | POA: Diagnosis not present

## 2023-02-16 DIAGNOSIS — D369 Benign neoplasm, unspecified site: Secondary | ICD-10-CM | POA: Diagnosis not present

## 2023-02-16 DIAGNOSIS — K317 Polyp of stomach and duodenum: Secondary | ICD-10-CM

## 2023-02-16 DIAGNOSIS — Z1211 Encounter for screening for malignant neoplasm of colon: Secondary | ICD-10-CM | POA: Diagnosis not present

## 2023-02-16 MED ORDER — SUCRALFATE 1 G PO TABS: 1.0000 g | ORAL_TABLET | Freq: Two times a day (BID) | ORAL | 12 refills | Status: DC

## 2023-02-16 MED ORDER — SUCRALFATE 1 GM/10ML PO SUSP: 1.0000 g | Freq: Two times a day (BID) | ORAL | 1 refills | Status: DC
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
## 2023-02-16 NOTE — Patient Instructions (Signed)
You have been scheduled for an endoscopy and colonoscopy. Please follow the written instructions given to you at your visit today. Please pick up your prep supplies at the pharmacy within the next 1-3 days. If you use inhalers (even only as needed), please bring them with you on the day of your procedure.  We have sent the following medications to your pharmacy for you to pick up at your convenience: Carafate If you are not able to swallow tablets, you may dissolve 1 pill in 10 ml of warm water (take twice daily).  Due to recent changes in healthcare laws, you may see the results of your imaging and laboratory studies on MyChart before your provider has had a chance to review them.  We understand that in some cases there may be results that are confusing or concerning to you. Not all laboratory results come back in the same time frame and the provider may be waiting for multiple results in order to interpret others.  Please give [place] 48 hours in order for your provider to thoroughly review all the results before contacting the office for clarification of your results.   _______________________________________________________  If your blood pressure at your visit was 140/90 or greater, please contact your primary care physician to follow up on this.  _______________________________________________________  If you are age 59 or older, your body mass index should be between 23-30. Your Body mass index is 28.27 kg/m. If this is out of the aforementioned range listed, please consider follow up with your Primary Care Provider.  If you are age 59 or younger, your body mass index should be between 19-25. Your Body mass index is 28.27 kg/m. If this is out of the aformentioned range listed, please consider follow up with your Primary Care Provider.   ________________________________________________________  The [facility] providers would like to encourage you to use [facility] to communicate with providers  for non-urgent requests or questions.  Due to long hold times on the telephone, sending your provider a message by [facility] may be a faster and more efficient way to get a response.  Please allow 48 business hours for a response.  Please remember that this is for non-urgent requests.  _______________________________________________________  Thank you for choosing me and [facility].  Dr. Meridee Score

## 2023-02-18 DIAGNOSIS — Z1211 Encounter for screening for malignant neoplasm of colon: Secondary | ICD-10-CM | POA: Insufficient documentation

## 2023-02-18 DIAGNOSIS — K219 Gastro-esophageal reflux disease without esophagitis: Secondary | ICD-10-CM | POA: Insufficient documentation

## 2023-02-22 ENCOUNTER — Ambulatory Visit (INDEPENDENT_AMBULATORY_CARE_PROVIDER_SITE_OTHER): Payer: Medicare HMO

## 2023-02-22 DIAGNOSIS — J309 Allergic rhinitis, unspecified: Secondary | ICD-10-CM | POA: Diagnosis not present

## 2023-03-03 ENCOUNTER — Ambulatory Visit (INDEPENDENT_AMBULATORY_CARE_PROVIDER_SITE_OTHER): Payer: Medicare HMO

## 2023-03-03 DIAGNOSIS — J309 Allergic rhinitis, unspecified: Secondary | ICD-10-CM | POA: Diagnosis not present

## 2023-03-10 ENCOUNTER — Ambulatory Visit (INDEPENDENT_AMBULATORY_CARE_PROVIDER_SITE_OTHER): Payer: Medicare HMO

## 2023-03-10 DIAGNOSIS — J309 Allergic rhinitis, unspecified: Secondary | ICD-10-CM | POA: Diagnosis not present

## 2023-03-16 ENCOUNTER — Telehealth: Payer: Self-pay

## 2023-03-16 NOTE — Telephone Encounter (Signed)
PA request received via CMM for Dexlansoprazole 60MG  dr capsules  PA submitted to [employer] via [facility] and has been APPROVED from 03/16/2023-03/14/2024  Key: [id number]

## 2023-03-17 ENCOUNTER — Ambulatory Visit (INDEPENDENT_AMBULATORY_CARE_PROVIDER_SITE_OTHER): Payer: Medicare HMO

## 2023-03-17 DIAGNOSIS — J309 Allergic rhinitis, unspecified: Secondary | ICD-10-CM

## 2023-03-31 ENCOUNTER — Encounter (HOSPITAL_COMMUNITY): Payer: Self-pay | Admitting: Gastroenterology

## 2023-04-06 ENCOUNTER — Ambulatory Visit (INDEPENDENT_AMBULATORY_CARE_PROVIDER_SITE_OTHER): Payer: Medicare HMO

## 2023-04-06 DIAGNOSIS — J309 Allergic rhinitis, unspecified: Secondary | ICD-10-CM
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
## 2023-04-08 ENCOUNTER — Ambulatory Visit (HOSPITAL_COMMUNITY)
Admission: RE | Admit: 2023-04-08 | Discharge: 2023-04-08 | Disposition: A | Discharge status: Home / Self Care | Payer: Medicare HMO | Attending: Gastroenterology | Admitting: Gastroenterology

## 2023-04-08 ENCOUNTER — Encounter (HOSPITAL_COMMUNITY): Payer: Self-pay | Admitting: Gastroenterology

## 2023-04-08 ENCOUNTER — Other Ambulatory Visit: Payer: Self-pay

## 2023-04-08 ENCOUNTER — Ambulatory Visit (HOSPITAL_COMMUNITY): Payer: Medicare HMO | Admitting: Anesthesiology

## 2023-04-08 ENCOUNTER — Ambulatory Visit (HOSPITAL_BASED_OUTPATIENT_CLINIC_OR_DEPARTMENT_OTHER): Payer: Medicare HMO | Admitting: Anesthesiology

## 2023-04-08 ENCOUNTER — Encounter (HOSPITAL_COMMUNITY)
Admission: RE | Disposition: A | Discharge status: Home / Self Care | Payer: Self-pay | Source: Home / Self Care | Attending: Gastroenterology

## 2023-04-08 DIAGNOSIS — Z79899 Other long term (current) drug therapy: Secondary | ICD-10-CM | POA: Insufficient documentation

## 2023-04-08 DIAGNOSIS — K449 Diaphragmatic hernia without obstruction or gangrene: Secondary | ICD-10-CM | POA: Insufficient documentation

## 2023-04-08 DIAGNOSIS — K219 Gastro-esophageal reflux disease without esophagitis: Secondary | ICD-10-CM | POA: Insufficient documentation

## 2023-04-08 DIAGNOSIS — K642 Third degree hemorrhoids: Secondary | ICD-10-CM

## 2023-04-08 DIAGNOSIS — K644 Residual hemorrhoidal skin tags: Secondary | ICD-10-CM | POA: Diagnosis not present

## 2023-04-08 DIAGNOSIS — Z09 Encounter for follow-up examination after completed treatment for conditions other than malignant neoplasm: Secondary | ICD-10-CM | POA: Diagnosis not present

## 2023-04-08 DIAGNOSIS — D128 Benign neoplasm of rectum: Secondary | ICD-10-CM | POA: Diagnosis not present

## 2023-04-08 DIAGNOSIS — K5730 Diverticulosis of large intestine without perforation or abscess without bleeding: Secondary | ICD-10-CM | POA: Diagnosis not present

## 2023-04-08 DIAGNOSIS — E119 Type 2 diabetes mellitus without complications: Secondary | ICD-10-CM | POA: Diagnosis not present

## 2023-04-08 DIAGNOSIS — Z903 Acquired absence of stomach [part of]: Secondary | ICD-10-CM | POA: Insufficient documentation

## 2023-04-08 DIAGNOSIS — I10 Essential (primary) hypertension: Secondary | ICD-10-CM | POA: Diagnosis not present

## 2023-04-08 DIAGNOSIS — Z1211 Encounter for screening for malignant neoplasm of colon: Secondary | ICD-10-CM

## 2023-04-08 DIAGNOSIS — K760 Fatty (change of) liver, not elsewhere classified: Secondary | ICD-10-CM | POA: Diagnosis not present

## 2023-04-08 DIAGNOSIS — K3189 Other diseases of stomach and duodenum: Secondary | ICD-10-CM | POA: Diagnosis not present

## 2023-04-08 DIAGNOSIS — K317 Polyp of stomach and duodenum: Secondary | ICD-10-CM | POA: Diagnosis not present

## 2023-04-08 DIAGNOSIS — Z89511 Acquired absence of right leg below knee: Secondary | ICD-10-CM | POA: Insufficient documentation

## 2023-04-08 DIAGNOSIS — D131 Benign neoplasm of stomach: Secondary | ICD-10-CM | POA: Insufficient documentation

## 2023-04-08 DIAGNOSIS — K621 Rectal polyp: Secondary | ICD-10-CM

## 2023-04-08 HISTORY — PX: POLYPECTOMY: SHX5525

## 2023-04-08 HISTORY — PX: COLONOSCOPY WITH PROPOFOL: SHX5780

## 2023-04-08 HISTORY — PX: BIOPSY: SHX5522

## 2023-04-08 HISTORY — PX: ESOPHAGOGASTRODUODENOSCOPY (EGD) WITH PROPOFOL: SHX5813

## 2023-04-08 LAB — GLUCOSE, CAPILLARY: Glucose-Capillary: 93 mg/dL (ref 70–99)

## 2023-04-08 SURGERY — COLONOSCOPY WITH PROPOFOL
Anesthesia: Monitor Anesthesia Care

## 2023-04-08 MED ORDER — DEXMEDETOMIDINE HCL IN NACL 80 MCG/20ML IV SOLN
INTRAVENOUS | Status: DC | PRN
  Administered 2023-04-08: 12 ug via INTRAVENOUS

## 2023-04-08 MED ORDER — EPHEDRINE SULFATE-NACL 50-0.9 MG/10ML-% IV SOSY
PREFILLED_SYRINGE | INTRAVENOUS | Status: DC | PRN
  Administered 2023-04-08 (×2): 5 mg via INTRAVENOUS

## 2023-04-08 MED ORDER — PROPOFOL 500 MG/50ML IV EMUL
INTRAVENOUS | Status: DC | PRN
  Administered 2023-04-08: 75 ug/kg/min via INTRAVENOUS
  Administered 2023-04-08: 50 mg via INTRAVENOUS

## 2023-04-08 MED ORDER — FENTANYL CITRATE (PF) 100 MCG/2ML IJ SOLN
INTRAMUSCULAR | Status: AC
  Filled 2023-04-08: qty 2

## 2023-04-08 MED ORDER — SODIUM CHLORIDE 0.9 % IV SOLN: INTRAVENOUS | Status: DC

## 2023-04-08 MED ORDER — PROPOFOL 500 MG/50ML IV EMUL
INTRAVENOUS | Status: AC
  Filled 2023-04-08: qty 50

## 2023-04-08 MED ORDER — MIDAZOLAM HCL 2 MG/2ML IJ SOLN
INTRAMUSCULAR | Status: AC
  Filled 2023-04-08: qty 2

## 2023-04-08 MED ORDER — HYDROCORTISONE ACETATE 25 MG RE SUPP: 25.0000 mg | Freq: Every evening | RECTAL | 0 refills | Status: DC

## 2023-04-08 MED ORDER — LACTATED RINGERS IV SOLN: INTRAVENOUS | Status: DC | PRN

## 2023-04-08 MED ORDER — PROPOFOL 10 MG/ML IV BOLUS
INTRAVENOUS | Status: AC
  Filled 2023-04-08: qty 20

## 2023-04-08 SURGICAL SUPPLY — 25 items
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
## 2023-04-08 NOTE — Anesthesia Postprocedure Evaluation (Signed)
Anesthesia Post Note  Patient: Arra Connaughton  Procedure(s) Performed: COLONOSCOPY WITH PROPOFOL ESOPHAGOGASTRODUODENOSCOPY (EGD) WITH PROPOFOL BIOPSY POLYPECTOMY     Patient location during evaluation: PACU Anesthesia Type: MAC Level of consciousness: awake Pain management: pain level controlled Vital Signs Assessment: post-procedure vital signs reviewed and stable Respiratory status: spontaneous breathing, nonlabored ventilation and respiratory function stable Cardiovascular status: stable and blood pressure returned to baseline Postop Assessment: no apparent nausea or vomiting Anesthetic complications: no   No notable events documented.  Last Vitals:  Vitals:   04/08/23 0738 04/08/23 0847  BP: (!) 169/64 133/68  Pulse: 62 74  Resp: 12 17  Temp: (!) 36.3 C (!) 36.2 C  SpO2: 99% 100%    Last Pain:  Vitals:   04/08/23 0847  TempSrc: Tympanic  PainSc: Asleep                 Linton Rump

## 2023-04-08 NOTE — Transfer of Care (Signed)
Immediate Anesthesia Transfer of Care Note  Patient: Joanna [name]  Procedure(s) Performed: Procedure(s): COLONOSCOPY WITH PROPOFOL (N/A) ESOPHAGOGASTRODUODENOSCOPY (EGD) WITH PROPOFOL (N/A) BIOPSY POLYPECTOMY  Patient Location: PACU and Endoscopy Unit  Anesthesia Type:MAC  Level of Consciousness: awake, alert  and oriented  Airway & Oxygen Therapy: Patient Spontanous Breathing and Patient connected to nasal cannula oxygen  Post-op Assessment: Report given to RN and Post -op Vital signs reviewed and stable  Post vital signs: Reviewed and stable  Last Vitals:  Vitals:   04/08/23 0738  BP: (!) 169/64  Pulse: 62  Resp: 12  Temp: (!) 36.3 C  SpO2: 99%    Complications: No apparent anesthesia complications

## 2023-04-08 NOTE — Discharge Instructions (Signed)
YOU HAD AN ENDOSCOPIC PROCEDURE TODAY: Refer to the procedure report and other information in the discharge instructions given to you for any specific questions about what was found during the examination. If this information does not answer your questions, please call [place] office at [phone] to clarify.   YOU SHOULD EXPECT: Some feelings of bloating in the abdomen. Passage of more gas than usual. Walking can help get rid of the air that was put into your GI tract during the procedure and reduce the bloating. If you had a lower endoscopy (such as a colonoscopy or flexible sigmoidoscopy) you may notice spotting of blood in your stool or on the toilet paper. Some abdominal soreness may be present for a day or two, also.  DIET: Your first meal following the procedure should be a light meal and then it is ok to progress to your normal diet. A half-sandwich or bowl of soup is an example of a good first meal. Heavy or fried foods are harder to digest and may make you feel nauseous or bloated. Drink plenty of fluids but you should avoid alcoholic beverages for 24 hours. If you had a esophageal dilation, please see attached instructions for diet.    ACTIVITY: Your care partner should take you home directly after the procedure. You should plan to take it easy, moving slowly for the rest of the day. You can resume normal activity the day after the procedure however YOU SHOULD NOT DRIVE, use power tools, machinery or perform tasks that involve climbing or major physical exertion for 24 hours (because of the sedation medicines used during the test).   SYMPTOMS TO REPORT IMMEDIATELY: A gastroenterologist can be reached at any hour. Please call [phone]  for any of the following symptoms:  Following lower endoscopy (colonoscopy, flexible sigmoidoscopy) Excessive amounts of blood in the stool  Significant tenderness, worsening of abdominal pains  Swelling of the abdomen that is new, acute  Fever of 100 or  higher  Following upper endoscopy (EGD, EUS, ERCP, esophageal dilation) Vomiting of blood or coffee ground material  New, significant abdominal pain  New, significant chest pain or pain under the shoulder blades  Painful or persistently difficult swallowing  New shortness of breath  Black, tarry-looking or red, bloody stools  FOLLOW UP:  If any biopsies were taken you will be contacted by phone or by letter within the next 1-3 weeks. Call [phone]  if you have not heard about the biopsies in 3 weeks.  Please also call with any specific questions about appointments or follow up tests.YOU HAD AN ENDOSCOPIC PROCEDURE TODAY: Refer to the procedure report and other information in the discharge instructions given to you for any specific questions about what was found during the examination. If this information does not answer your questions, please call [place] office at [phone] to clarify.   YOU SHOULD EXPECT: Some feelings of bloating in the abdomen. Passage of more gas than usual. Walking can help get rid of the air that was put into your GI tract during the procedure and reduce the bloating. If you had a lower endoscopy (such as a colonoscopy or flexible sigmoidoscopy) you may notice spotting of blood in your stool or on the toilet paper. Some abdominal soreness may be present for a day or two, also.  DIET: Your first meal following the procedure should be a light meal and then it is ok to progress to your normal diet. A half-sandwich or bowl of soup is an example of a   good first meal. Heavy or fried foods are harder to digest and may make you feel nauseous or bloated. Drink plenty of fluids but you should avoid alcoholic beverages for 24 hours. If you had a esophageal dilation, please see attached instructions for diet.    ACTIVITY: Your care partner should take you home directly after the procedure. You should plan to take it easy, moving slowly for the rest of the day. You can resume  normal activity the day after the procedure however YOU SHOULD NOT DRIVE, use power tools, machinery or perform tasks that involve climbing or major physical exertion for 24 hours (because of the sedation medicines used during the test).   SYMPTOMS TO REPORT IMMEDIATELY: A gastroenterologist can be reached at any hour. Please call [phone]  for any of the following symptoms:  Following lower endoscopy (colonoscopy, flexible sigmoidoscopy) Excessive amounts of blood in the stool  Significant tenderness, worsening of abdominal pains  Swelling of the abdomen that is new, acute  Fever of 100 or higher  Following upper endoscopy (EGD, EUS, ERCP, esophageal dilation) Vomiting of blood or coffee ground material  New, significant abdominal pain  New, significant chest pain or pain under the shoulder blades  Painful or persistently difficult swallowing  New shortness of breath  Black, tarry-looking or red, bloody stools  FOLLOW UP:  If any biopsies were taken you will be contacted by phone or by letter within the next 1-3 weeks. Call [phone]  if you have not heard about the biopsies in 3 weeks.  Please also call with any specific questions about appointments or follow up tests. 

## 2023-04-08 NOTE — Op Note (Signed)
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 Allen Medicines:                Monitored Anesthesia Care Complications:            No immediate complications. Estimated Blood Loss:     Estimated blood loss was minimal. Procedure:                Pre-Anesthesia Assessment:                           - Prior to the procedure, a History and Physical                            was performed, and patient medications and                            allergies were reviewed. The patient's tolerance of                            previous anesthesia was also reviewed. The risks                            and benefits of the procedure and the sedation                            options and risks were discussed with the patient.                            All questions were answered, and informed consent                            was obtained. Prior Anticoagulants: The patient has                            taken no anticoagulant or antiplatelet agents. ASA                            Grade Assessment: III - A patient with severe  systemic disease. After reviewing the risks and                            benefits, the patient was deemed in satisfactory                            condition to undergo the procedure.                           After obtaining informed consent, the endoscope was                            passed under direct vision.  Throughout the                            procedure, the patient's blood pressure, pulse, and                            oxygen saturations were monitored continuously. The                            [id number]-H190 ([id number]) Olympus endoscope was introduced                            through the mouth, and advanced to the second part                            of duodenum. The upper GI endoscopy was                            accomplished without difficulty. The patient                            tolerated the procedure. Scope In: Scope Out: Findings:      No gross lesions were noted in the entire esophagus.      The Z-line was regular and was found 33 cm from the incisors.      A 3 cm hiatal hernia was present.      A medium scar consistent with area of previous partial gastrectomy was       found in the proximal gastric body. The scar tissue was healthy in       appearance.      Multiple 2 to 10 mm semi-sessile polyps with no bleeding and no stigmata       of recent bleeding were found in the gastric body. Biopsies were taken       with a cold forceps for histology.      Patchy mildly erythematous mucosa without bleeding was found in the       entire examined stomach. Biopsies were taken with a cold forceps for       histology and Helicobacter pylori testing.      No gross lesions were noted in the duodenal bulb, in the first portion       of the duodenum and in the second portion of the duodenum. Impression:               -  No gross lesions in the entire esophagus. Z-line                            regular, 33 cm from the incisors.                           - 3 cm hiatal hernia.                           - Scar in the gastric proximal body.                           - Multiple gastric polyps. Biopsied.                           - Erythematous mucosa in the stomach. Biopsied.                           - No gross lesions in the duodenal bulb, in the                            first portion  of the duodenum and in the second                            portion of the duodenum. Moderate Sedation:      Not Applicable - Patient had care per Anesthesia. Recommendation:           - Proceed to scheduled colonoscopy.                           - Observe patient's clinical course.                           - Await pathology results.                           - The findings and recommendations were discussed                            with the patient.                           - The findings and recommendations were discussed                            with the patient's family. Procedure Code(s):        --- Professional ---                           [phone], Esophagogastroduodenoscopy, flexible,                            transoral; with biopsy, single or multiple Diagnosis Code(s):        --- Professional ---  K44.9, Diaphragmatic hernia without obstruction or                            gangrene                           K31.89, Other diseases of stomach and duodenum                           K31.7, Polyp of stomach and duodenum                           D13.1, Benign neoplasm of stomach                           Z09, Encounter for follow-up examination after                            completed treatment for conditions other than                            malignant neoplasm                           Z90.3, Acquired absence of stomach [part of] CPT copyright 2022 [employer]. All rights reserved. The codes documented in this report are preliminary and upon coder review may  be revised to meet current compliance requirements. Corliss Parish, MD 04/08/2023 8:57:30 AM Number of Addenda: 0

## 2023-04-08 NOTE — Op Note (Signed)
[facility] Patient Name: Joanna [name] Procedure Date: 04/08/2023 MRN: [id number] Attending MD: Corliss Parish , MD, [id number] Date of Birth: 1966-06-27 CSN: [id number] Age: 57 Admit Type: Outpatient Procedure:                Colonoscopy Indications:              Screening for colorectal malignant neoplasm Providers:                Corliss Parish, MD, Margaree Mackintosh, RN,                            Kandice Robinsons, Technician Referring MD:              Medicines:                Monitored Anesthesia Care Complications:            No immediate complications. Estimated Blood Loss:     Estimated blood loss was minimal. Procedure:                Pre-Anesthesia Assessment:                           - Prior to the procedure, a History and Physical                            was performed, and patient medications and                            allergies were reviewed. The patient's tolerance of                            previous anesthesia was also reviewed. The risks                            and benefits of the procedure and the sedation                            options and risks were discussed with the patient.                            All questions were answered, and informed consent                            was obtained. Prior Anticoagulants: The patient has                            taken no anticoagulant or antiplatelet agents. ASA                            Grade Assessment: III - A patient with severe                            systemic disease. After reviewing the risks and  benefits, the patient was deemed in satisfactory                            condition to undergo the procedure.                           After obtaining informed consent, the colonoscope                            was passed under direct vision. Throughout the                            procedure, the patient's blood pressure, pulse, and                             oxygen saturations were monitored continuously. The                            CF-HQ190L ([id number]) Olympus colonoscope was                            introduced through the anus and advanced to the 3                            cm into the ileum. The colonoscopy was performed                            without difficulty. The patient tolerated the                            procedure. The quality of the bowel preparation was                            adequate. The terminal ileum, ileocecal valve,                            appendiceal orifice, and rectum were photographed. Scope In: 8:24:20 AM Scope Out: 8:41:13 AM Scope Withdrawal Time: 0 hours 12 minutes 40 seconds  Total Procedure Duration: 0 hours 16 minutes 53 seconds  Findings:      The digital rectal exam findings include hemorrhoids. Pertinent       negatives include no palpable rectal lesions.      A large amount of semi-liquid stool was found in the entire colon,       interfering with visualization. Lavage of the area was performed using       copious amounts, resulting in clearance with adequate visualization.      Two sessile polyps were found in the rectum. The polyps were 2 to 3 mm       in size. These polyps were removed with a cold snare. Resection and       retrieval were complete.      Multiple small-mouthed diverticula were found in the recto-sigmoid colon       and sigmoid colon.      Normal mucosa was found in the entire colon otherwise.  Non-bleeding non-thrombosed external and internal hemorrhoids were found       during retroflexion, during perianal exam and during digital exam. The       hemorrhoids were Grade III (internal hemorrhoids that prolapse but       require manual reduction). Impression:               - Hemorrhoids found on digital rectal exam.                           - Stool in the entire examined colon.                           - Two 2 to 3 mm polyps in the rectum, removed  with                            a cold snare. Resected and retrieved.                           - Diverticulosis in the recto-sigmoid colon and in                            the sigmoid colon.                           - Normal mucosa in the entire examined colon                            otherwise.                           - Non-bleeding non-thrombosed external and internal                            hemorrhoids. Moderate Sedation:      Not Applicable - Patient had care per Anesthesia. Recommendation:           - The patient will be observed post-procedure,                            until all discharge criteria are met.                           - Discharge patient to home.                           - Patient has a contact number available for                            emergencies. The signs and symptoms of potential                            delayed complications were discussed with the                            patient. Return to normal activities tomorrow.  Written discharge instructions were provided to the                            patient.                           - High fiber diet.                           - Continue present medications.                           - Await pathology results.                           - Repeat colonoscopy in 5-10 years for surveillance                            based on pathology results.                           - The findings and recommendations were discussed                            with the patient.                           - The findings and recommendations were discussed                            with the patient's family. Procedure Code(s):        --- Professional ---                           [id number], Colonoscopy, flexible; with removal of                            tumor(s), polyp(s), or other lesion(s) by snare                            technique Diagnosis Code(s):        --- Professional ---                            Z12.11, Encounter for screening for malignant                            neoplasm of colon                           K64.2, Third degree hemorrhoids                           D12.8, Benign neoplasm of rectum                           K57.30, Diverticulosis of large intestine without  perforation or abscess without bleeding CPT copyright 2022 American Medical Association. All rights reserved. The codes documented in this report are preliminary and upon coder review may  be revised to meet current compliance requirements. Corliss Parish, MD 04/08/2023 9:01:39 AM Number of Addenda: 0

## 2023-04-09 ENCOUNTER — Encounter: Payer: Self-pay | Admitting: Gastroenterology

## 2023-04-09 LAB — SURGICAL PATHOLOGY

## 2023-04-12 ENCOUNTER — Ambulatory Visit (INDEPENDENT_AMBULATORY_CARE_PROVIDER_SITE_OTHER): Payer: Medicare HMO

## 2023-04-12 ENCOUNTER — Encounter (HOSPITAL_COMMUNITY): Payer: Self-pay | Admitting: Gastroenterology

## 2023-04-12 DIAGNOSIS — J4550 Severe persistent asthma, uncomplicated: Secondary | ICD-10-CM

## 2023-04-13 ENCOUNTER — Encounter: Payer: Self-pay | Admitting: Family

## 2023-04-13 ENCOUNTER — Ambulatory Visit: Payer: Medicare HMO | Admitting: Family

## 2023-04-13 DIAGNOSIS — R634 Abnormal weight loss: Secondary | ICD-10-CM | POA: Diagnosis not present

## 2023-04-13 DIAGNOSIS — Z89511 Acquired absence of right leg below knee: Secondary | ICD-10-CM | POA: Diagnosis not present
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
## 2023-04-19 ENCOUNTER — Telehealth: Payer: Self-pay | Admitting: Family

## 2023-04-19 NOTE — Telephone Encounter (Signed)
04/13/23 progress note faxed to [facility] along with prescription/swo [phone]

## 2023-04-27 ENCOUNTER — Telehealth: Payer: Self-pay | Admitting: Family

## 2023-04-27 NOTE — Telephone Encounter (Signed)
04/13/23 ov note faxed to [facility] [phone]

## 2023-05-05 ENCOUNTER — Ambulatory Visit (INDEPENDENT_AMBULATORY_CARE_PROVIDER_SITE_OTHER): Payer: Medicare HMO

## 2023-05-05 ENCOUNTER — Telehealth: Payer: Self-pay | Admitting: Gastroenterology

## 2023-05-05 DIAGNOSIS — J309 Allergic rhinitis, unspecified: Secondary | ICD-10-CM | POA: Diagnosis not present

## 2023-05-05 MED ORDER — SUCRALFATE 1 GM/10ML PO SUSP: 1.0000 g | Freq: Two times a day (BID) | ORAL | 1 refills | Status: DC

## 2023-05-05 NOTE — Telephone Encounter (Signed)
Refill for Carafate sent to [facility] in [facility].

## 2023-05-05 NOTE — Telephone Encounter (Signed)
PT is calling to get refill on sulcrafate. It should be sent to [facility] in [facility]

## 2023-05-17 ENCOUNTER — Telehealth: Payer: Self-pay | Admitting: Gastroenterology

## 2023-05-17 NOTE — Telephone Encounter (Signed)
Inbound call from patient requesting to discuss Voquezna medication. Requesting to know if she is able to replace sucralfate and dexilant medication with voquezna medication so she does not have to take 2 separate medication. Requesting a call back to discuss further. Please advise, thank you.

## 2023-05-18 MED ORDER — VOQUEZNA 20 MG PO TABS: 20.0000 mg | ORAL_TABLET | Freq: Every day | ORAL | 0 refills | Status: DC

## 2023-05-18 NOTE — Addendum Note (Signed)
Addended byLucky Rathke on: 05/18/2023 02:31 PM   Modules accepted: Orders

## 2023-05-18 NOTE — Telephone Encounter (Signed)
Patient will stop Carafate and Dexilant. Patient will trial Voquenza 20 mg - once daily. Patient will let us know in about 4-6 weeks if Voquenza is working for her, otherwise she will consider recommendation of Ph impedance testing as per Dr Elesa Hacker note. Rx has been sent to [facility] pharmacy. Patient has been informed and voiced understanding.

## 2023-05-18 NOTE — Telephone Encounter (Signed)
Dr Meridee Score please advise if office appt would be needed to discuss?

## 2023-05-18 NOTE — Telephone Encounter (Signed)
[name], We can put in a prescription for Voquenza 20 mg daily (she has previously been on other PPIs including Nexium at high-dose and Dexilant and high-dose) If we get approval for Voquenza, she could trial this while being off of Dexilant and Carafate to see if it makes a difference. If patient notices difference then she can remain off of Dexilant and Carafate long-term. If she does not notice a difference, after 4 to 6 weeks, then we will plan pH impedance testing. Thanks. GM

## 2023-05-19 NOTE — Telephone Encounter (Signed)
Inbound call from patient stating trial Voquenza was denied by her insurance. States she would like to continue with previous medications. Please advise, thank you.

## 2023-05-19 NOTE — Telephone Encounter (Signed)
Returned call to patient. Patient states that pharmacist tried using a coupon card as well and prescription was still over $600.00 dollars. Patient states that she would like to continue with Dexilant and Carafate. Patient does not need refills at this time.

## 2023-05-19 NOTE — Addendum Note (Signed)
Addended byLucky Rathke on: 05/19/2023 01:22 PM   Modules accepted: Orders

## 2023-05-26 ENCOUNTER — Other Ambulatory Visit: Payer: Self-pay | Admitting: Gastroenterology

## 2023-06-04 ENCOUNTER — Telehealth: Payer: Self-pay

## 2023-06-04 MED ORDER — PROAIR RESPICLICK 108 (90 BASE) MCG/ACT IN AEPB: 2.0000 | INHALATION_SPRAY | RESPIRATORY_TRACT | 1 refills | Status: DC | PRN

## 2023-06-04 NOTE — Telephone Encounter (Signed)
Pt called stating she has a sinus infection and was given leviquin for 10 days and it's causing her to cough, her albuterol is expired  and needs refill.  Sent refill into [facility].  Joanna [name] [phone]

## 2023-06-07 ENCOUNTER — Ambulatory Visit (INDEPENDENT_AMBULATORY_CARE_PROVIDER_SITE_OTHER): Payer: Medicare HMO

## 2023-06-07 ENCOUNTER — Telehealth: Payer: Self-pay

## 2023-06-07 ENCOUNTER — Other Ambulatory Visit (HOSPITAL_COMMUNITY): Payer: Self-pay

## 2023-06-07 ENCOUNTER — Other Ambulatory Visit: Payer: Self-pay

## 2023-06-07 DIAGNOSIS — J4550 Severe persistent asthma, uncomplicated: Secondary | ICD-10-CM | POA: Diagnosis not present

## 2023-06-07 DIAGNOSIS — J309 Allergic rhinitis, unspecified: Secondary | ICD-10-CM

## 2023-06-07 MED ORDER — ALBUTEROL SULFATE HFA 108 (90 BASE) MCG/ACT IN AERS: 2.0000 | INHALATION_SPRAY | Freq: Four times a day (QID) | RESPIRATORY_TRACT | 2 refills | Status: AC | PRN

## 2023-06-07 NOTE — Telephone Encounter (Signed)
Sent in [facility]olin for pt to [facility]

## 2023-06-07 NOTE — Telephone Encounter (Signed)
*  [facility]  Pharmacy Patient Advocate Encounter   Received notification from CoverMyMeds that prior authorization for ProAir RespiClick 108 (90 Base)MCG/ACT aerosol powder  is required/requested.   Insurance verification completed.   The patient is insured through [employer] .   Per test claim: CANCELLED due to *ProAir Respiclick not covered. Please resend for covered Albuterol 8.5gm HFA or 18gm if appropriate. Thank you!

## 2023-06-10 ENCOUNTER — Ambulatory Visit (INDEPENDENT_AMBULATORY_CARE_PROVIDER_SITE_OTHER): Payer: Medicare HMO

## 2023-06-10 DIAGNOSIS — J309 Allergic rhinitis, unspecified: Secondary | ICD-10-CM
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
## 2023-06-23 ENCOUNTER — Ambulatory Visit: Payer: Medicare HMO | Admitting: Internal Medicine

## 2023-06-24 ENCOUNTER — Telehealth: Payer: Self-pay

## 2023-06-24 NOTE — Telephone Encounter (Signed)
Calling Joanna [name] to check on her to see how she was feeling per Dr. Maurine Minister. Patient canceled appointment yesterday. Patient was running a fever yesterday, but she says she is feeling much better today.

## 2023-07-02 ENCOUNTER — Other Ambulatory Visit: Payer: Self-pay | Admitting: Allergy & Immunology

## 2023-07-05 ENCOUNTER — Ambulatory Visit (INDEPENDENT_AMBULATORY_CARE_PROVIDER_SITE_OTHER): Payer: Medicare HMO

## 2023-07-05 DIAGNOSIS — J309 Allergic rhinitis, unspecified: Secondary | ICD-10-CM | POA: Diagnosis not present

## 2023-07-08 ENCOUNTER — Telehealth: Payer: Self-pay

## 2023-07-08 NOTE — Telephone Encounter (Signed)
I would not have any issues switching her to dupixent. The question is whether Joanna [name] will want to do that since she has been controlled on fasenra for so long. I would expect her to remain controlled on dupixent, but this is something we should probably discuss in person.

## 2023-07-08 NOTE — Telephone Encounter (Signed)
Elyshia from [facility] returning my call and I let her know that Charleen has a televist with Dr. Maurine Minister tomorrow and will discuss switching from fasenra to dupixent bc of her atopic dermatitis and we'll have a documented televisit and tammy can work on switching Deshawn from fasenra to [employer]. Cythia said her next fasenra is due October 7th. I told Theresia Majors we will be doing the switching on our end.

## 2023-07-08 NOTE — Telephone Encounter (Signed)
Left Joanna [name] with [facility] a message what Dr. Kirkland Hun response from message I took from Joanna [name] today. Dr. Maurine Minister has no problem with Zakya switching, but will have a televisit with Dr. Maurine Minister so it will be documented that she had a visit so Tammy can change the biologic.

## 2023-07-08 NOTE — Telephone Encounter (Signed)
Elyshia calling from [facility] regarding Joanna [name] that she has been having irritation around her top and bottom lips. She does have atopic dermitis. There has been different treatment plans for the patient and the irritation around her lips continually stay present. The treatments patient has had is a steroid injection,which does help, but not good to have all the time. Patch testing was negative, pacrolinus, opzelura. Patient is currently on fasenra, question could she switch to dupixent to see if that will help with the skin irritation around her lips. Elyshia's number at [facility] is [phone]

## 2023-07-09 ENCOUNTER — Ambulatory Visit (INDEPENDENT_AMBULATORY_CARE_PROVIDER_SITE_OTHER): Payer: Medicare HMO | Admitting: Internal Medicine

## 2023-07-09 ENCOUNTER — Other Ambulatory Visit: Payer: Self-pay

## 2023-07-09 ENCOUNTER — Encounter: Payer: Self-pay | Admitting: Internal Medicine

## 2023-07-09 VITALS — Wt 153.0 lb

## 2023-07-09 DIAGNOSIS — J45909 Unspecified asthma, uncomplicated: Secondary | ICD-10-CM | POA: Diagnosis not present

## 2023-07-09 DIAGNOSIS — L308 Other specified dermatitis: Secondary | ICD-10-CM

## 2023-07-09 DIAGNOSIS — J4550 Severe persistent asthma, uncomplicated: Secondary | ICD-10-CM

## 2023-07-09 DIAGNOSIS — J301 Allergic rhinitis due to pollen: Secondary | ICD-10-CM

## 2023-07-09 DIAGNOSIS — L309 Dermatitis, unspecified: Secondary | ICD-10-CM | POA: Diagnosis not present

## 2023-07-09 MED ORDER — SPACER/AERO-HOLDING CHAMBERS DEVI: 1.0000 | 0 refills | Status: AC | PRN

## 2023-07-09 MED ORDER — BREZTRI AEROSPHERE 160-9-4.8 MCG/ACT IN AERO: INHALATION_SPRAY | RESPIRATORY_TRACT | 3 refills | Status: DC

## 2023-07-09 MED ORDER — ALBUTEROL SULFATE (2.5 MG/3ML) 0.083% IN NEBU: 2.5000 mg | INHALATION_SOLUTION | RESPIRATORY_TRACT | 1 refills | Status: DC | PRN

## 2023-07-09 NOTE — Progress Notes (Signed)
RE: Joanna [name] MRN: [id number] DOB: Apr 09, 1966 Date of Telemedicine Visit: 07/09/2023  Referring provider: Raynelle Jan., MD Primary care provider: Raynelle Jan., MD  Chief Complaint: Follow-up Joanna Hane- dupixent options on fesendra with derm.)   Telemedicine Follow Up Visit via Telephone: I connected with Joanna [name] for a follow up on 07/09/23 by telephone and verified that I am speaking with the correct person using two identifiers.   I discussed the limitations, risks, security and privacy concerns of performing an evaluation and management service by telephone and the availability of in person appointments. I also discussed with the patient that there may be a patient responsible charge related to this service. The patient expressed understanding and agreed to proceed.  Patient is at home accompanied by her husband who provided/contributed to the history.  Provider is at the office.  Visit start time: 11:15 AM Visit end time: 11:47 AM. Insurance consent/check in by: front desk-[employer] Medical consent and medical assistant/nurse: Lenda Kelp, CMA  History of Present Illness: She is a 57 y.o. female, who is being followed for allergic rhinitis and asthma. Her previous allergy office visit was on 01/11/2023 with  myself . Today is a new complaint visit of discussion regarding injectable asthma Biologics .  Patient's asthma has been controlled for several years on Fasenra.  She continues to use her Breztri and albuterol as needed. However, recently she was diagnosed with eczema and has been following with a dermatologist.  She is starting to have flares on her face around her mouth, her eyelids, behind her knees, etc.  She has a myriad of topical ointments.  Triamcinolone will help for a short period of time, but symptoms quickly returned.  She has had patch testing which returned negative.  Her dermatologist recommended considering Dupixent as a neck step for her eczema  care.  She had never had eczema in childhood, and this is a new problem for her.  We discussed in detail that Dupixent would be an excellent option for her as it also helps with both asthma and eczema.  I believe her asthma would remained controlled if not better on Dupixent, and Dupixent works Adult nurse in folks with eczema.  She is aware that her dosing schedule will be every 2 weeks instead of every 8.  I did also discuss that her first injection would be a loading dose-requiring 2 injections.  However, following that initial dose she will only receive 1 injection each time.  She would like to move forward with making the switch.  She does have some hesitancy regarding changing her asthma medications since she is currently controlled.  She is due for her next shipment mid-October.  She continues on her allergy injections and is tolerating these without symptoms.  Her last injection was on 07/05/2023.  Previous diagnostics: Previous testing SPT: positive to molds, dust mite, grasses, weeds, trees, cat, dog. On shots with 2 vials -on maintenance coming every 4 weeks at 0.5 (vial 1-mold, dust mite, vial 2-pollen-cat-dog).  06/02/2021 immunology evaluation: - Strep titers inadequate (2/23), normal quants (IgG 631, IgM 82, IgA 125), protective diptheria/tetanus, normal CH50, CBCd within normal limits and AEC 0 (on Fasenra) 06/02/21-normal CXR She has been controlled on allergen immunotherapy with prescription altered April 2018. 2022/2023: removal of GIST with adenomatoid mass, improvement in cough and reflux symptoms Has hx of tracheomalacia, bronchiectass and GERD. On Azithromycin PPX, managed by pulmonary.  Assessment and Plan: Joanna [name] is a 57 y.o. female with:  Asthma- Continue montelukast  10 mg nightly Continue Breztri 2 puffs twice a day with a spacer to prevent cough or wheeze Continue albuterol 2 puffs every 4 hours as needed for cough or wheeze OR Instead use albuterol 0.083%  solution via nebulizer one unit vial every 4 hours as needed for cough or wheeze You may use albuterol 2 puffs 5 to 15 minutes before activity to decrease cough or wheeze Will submit for dupixent to help with both asthma and eczema  Allergic rhinitis Continue allergen avoidance measures directed toward pollen, mold, dust mite, cat, and dog as listed below Continue allergen immunotherapy and have access to an epinephrine autoinjector set You may take an antihistamine once a day as needed for a runny nose or itch. Remember to rotate to a different antihistamine about every 3 months. Some examples of over the counter antihistamines include Zyrtec (cetirizine), Xyzal (levocetirizine), Allegra (fexofenadine), and Claritin (loratidine). Continue Flonase 2 sprays in each nostril once a day as needed for a stuffy nose. In the right nostril, point the applicator out toward the right ear. In the left nostril, point the applicator out toward the left ear Consider saline nasal rinses as needed for nasal symptoms. Use this before any medicated nasal sprays for best result  Reflux Continue dietary modifications as listed below Continue follow-up with GI   Food allergy Continue to avoid shellfish.  In case of an allergic reaction, take Benadryl 50 mg every 4 hours, and if life-threatening symptoms occur, inject with EpiPen 0.3 mg.  Stinging insect allergy Continue to avoid stinging insects. In case of an allergic reaction, take Benadryl 50 mg every 4 hours, and if life-threatening symptoms occur, inject with EpiPen 0.3 mg.  Eczema-difficult to treat - following with Dermatology - continue topicals as prescribed by dermatology - will work on getting fasenra approved to help with both asthma and eczema.  Call the clinic if this treatment plan is not working well for you.  Follow up in 12 months or sooner if needed. It was great talking with you today  Other: Our clinic staff did speak with her  dermatology yesterday regarding consideration of Dupixent.   Will coordinate updating her dermatology clinic regarding switch to Dupixent following today's encounter.  Lab Orders  No laboratory test(s) ordered today    Diagnostics: None.  Medication List:  Current Outpatient Medications  Medication Sig Dispense Refill   albuterol (PROVENTIL) (2.5 MG/3ML) 0.083% nebulizer solution Take 3 mLs (2.5 mg total) by nebulization every 4 (four) hours as needed for wheezing or shortness of breath. 75 mL 1   albuterol (VENTOLIN HFA) 108 (90 Base) MCG/ACT inhaler Inhale 2 puffs into the lungs every 6 (six) hours as needed for wheezing or shortness of breath. 8 g 2   Albuterol Sulfate (PROAIR RESPICLICK) 108 (90 Base) MCG/ACT AEPB Inhale 2 puffs into the lungs every 4 (four) hours as needed. 2 each 1   atenolol (TENORMIN) 25 MG tablet Take 25 mg by mouth in the morning.     azithromycin (ZITHROMAX) 250 MG tablet Take 250 mg by mouth every Monday, Wednesday, and Friday.     Budeson-Glycopyrrol-Formoterol (BREZTRI AEROSPHERE) 160-9-4.8 MCG/ACT AERO USE 2 INHALATIONS TWICE A DAY WITH SPACER TO PREVENT COUGHING OR WHEEZING 32.1 g 3   Calcium Carb-Cholecalciferol (CALCIUM 500+D3 PO) Take 1 tablet by mouth in the morning and at bedtime.     celecoxib (CELEBREX) 200 MG capsule Take 200 mg by mouth 2 (two) times daily.     dexlansoprazole (DEXILANT) 60 MG capsule  Take 60 mg by mouth daily.     docusate sodium (COLACE) 100 MG capsule Take 300 mg by mouth at bedtime.     doxycycline (PERIOSTAT) 20 MG tablet Take 20 mg by mouth 2 (two) times daily.     EPINEPHrine 0.3 mg/0.3 mL IJ SOAJ injection Inject 0.3 mg into the muscle as needed for anaphylaxis. 1 each 1   ezetimibe-simvastatin (VYTORIN) 10-40 MG tablet Take 1 tablet by mouth at bedtime.     FASENRA 30 MG/ML prefilled syringe INJECT 30 MG UNDER THE SKIN EVERY 8 WEEKS 1 mL 6   FIBER PO Take 2 tablets by mouth in the morning.     fluticasone (FLONASE) 50  MCG/ACT nasal spray Place 2 sprays into both nostrils daily. (Patient taking differently: Place 2 sprays into both nostrils at bedtime.) 48 mL 3   furosemide (LASIX) 40 MG tablet Take 40 mg by mouth in the morning.     gabapentin (NEURONTIN) 800 MG tablet Take 800 mg by mouth 2 (two) times daily.     hydrocortisone (ANUSOL-HC) 25 MG suppository Place 1 suppository (25 mg total) rectally at bedtime. Nightly for 1 week and then every other night until prescription done 12 suppository 0   l-methylfolate-B6-B12 (METANX) 3-35-2 MG TABS tablet Take 2 tablets by mouth in the morning.     lisinopril (ZESTRIL) 10 MG tablet Take 10 mg by mouth in the morning.     metroNIDAZOLE (METROGEL) 0.75 % gel Apply 1 application  topically at bedtime. Applied to face     montelukast (SINGULAIR) 10 MG tablet Take 1 tablet (10 mg total) by mouth at bedtime. 90 tablet 3   morphine (MS CONTIN) 30 MG 12 hr tablet Take 30 mg by mouth 3 (three) times daily.     Multiple Vitamin (MULTIVITAMIN WITH MINERALS) TABS tablet Take 1 tablet by mouth in the morning. Centrum adults 50+     NON FORMULARY Inject 1 Dose as directed every 30 (thirty) days. Allergy shots     OPZELURA 1.5 % CREA Apply 1 Application topically in the morning and at bedtime.     oxybutynin (DITROPAN-XL) 5 MG 24 hr tablet Take 5 mg by mouth in the morning and at bedtime.     potassium chloride SA (KLOR-CON) 20 MEQ tablet Take 20 mEq by mouth in the morning.     Probiotic Product (PROBIOTIC DAILY PO) Take 1 capsule by mouth in the morning. Florajen Probiotics     RHOFADE 1 % CREA Apply 1 application topically every morning.     Semaglutide (RYBELSUS) 3 MG TABS Take 3 mg by mouth in the morning.     sertraline (ZOLOFT) 100 MG tablet Take 200 mg by mouth in the morning.     Spacer/Aero-Holding Chambers DEVI 1 Device by Does not apply route as needed. 1 each 0   sucralfate (CARAFATE) 1 GM/10ML suspension TAKE 10 MLS BY MOUTH TWO TIMES A DAY 420 mL 1   traZODone  (DESYREL) 100 MG tablet Take 100 mg by mouth at bedtime.     Current Facility-Administered Medications  Medication Dose Route Frequency Provider Last Rate Last Admin   Benralizumab SOSY 30 mg  30 mg Subcutaneous Q8 Thomes Dinning, MD   30 mg at 06/07/23 0981   Allergies: Allergies  Allergen Reactions   Bee Venom Anaphylaxis   Ivp Dye [Iodinated Contrast Media] Hives and Shortness Of Breath   Shellfish Allergy Anaphylaxis   Topamax [Topiramate] Anaphylaxis, Itching and Swelling  Amoxicillin Swelling   Penicillins Swelling, Hives and Itching   Sulfa Antibiotics Swelling   Trulicity [Dulaglutide] Other (See Comments)    GI upset   I reviewed her past medical history, social history, family history, and environmental history and no significant changes have been reported from her previous visit.  Review of Systems-negative except as per above HPI  Objective: Physical Exam Not obtained as encounter was done via telephone.   Previous notes and tests were reviewed.  I discussed the assessment and treatment plan with the patient. The patient was provided an opportunity to ask questions and all were answered. The patient agreed with the plan and demonstrated an understanding of the instructions. After visit summary/patient instructions available via mychart.   The patient was advised to call back or seek an in-person evaluation if the symptoms worsen or if the condition fails to improve as anticipated.  I provided 20 minutes of non-face-to-face time during this encounter.  It was my pleasure to participate in Tatayana Rasso care today. Please feel free to contact me with any questions or concerns.   Sincerely,  Wyline Mood, DO Allergy & Immunology  [facility] office: [phone] [facility] office: [phone]

## 2023-07-09 NOTE — Patient Instructions (Addendum)
Asthma Continue montelukast 10 mg nightly Continue Breztri 2 puffs twice a day with a spacer to prevent cough or wheeze Continue albuterol 2 puffs every 4 hours as needed for cough or wheeze OR Instead use albuterol 0.083% solution via nebulizer one unit vial every 4 hours as needed for cough or wheeze You may use albuterol 2 puffs 5 to 15 minutes before activity to decrease cough or wheeze Will submit for dupixent to help with both asthma and eczema  Allergic rhinitis Continue allergen avoidance measures directed toward pollen, mold, dust mite, cat, and dog as listed below Continue allergen immunotherapy and have access to an epinephrine autoinjector set You may take an antihistamine once a day as needed for a runny nose or itch. Remember to rotate to a different antihistamine about every 3 months. Some examples of over the counter antihistamines include Zyrtec (cetirizine), Xyzal (levocetirizine), Allegra (fexofenadine), and Claritin (loratidine). Continue Flonase 2 sprays in each nostril once a day as needed for a stuffy nose. In the right nostril, point the applicator out toward the right ear. In the left nostril, point the applicator out toward the left ear Consider saline nasal rinses as needed for nasal symptoms. Use this before any medicated nasal sprays for best result  Reflux Continue dietary modifications as listed below Continue follow-up with GI   Food allergy Continue to avoid shellfish.  In case of an allergic reaction, take Benadryl 50 mg every 4 hours, and if life-threatening symptoms occur, inject with EpiPen 0.3 mg.  Stinging insect allergy Continue to avoid stinging insects. In case of an allergic reaction, take Benadryl 50 mg every 4 hours, and if life-threatening symptoms occur, inject with EpiPen 0.3 mg.  Eczema-difficult to treat - following with Dermatology - continue topicals as prescribed by dermatology - will work on getting fasenra approved to help with both  asthma and eczema.  Call the clinic if this treatment plan is not working well for you.  Follow up in 12 months or sooner if needed. It was great talking with you today.

## 2023-07-12 ENCOUNTER — Other Ambulatory Visit (HOSPITAL_COMMUNITY): Payer: Self-pay

## 2023-07-12 ENCOUNTER — Other Ambulatory Visit: Payer: Self-pay

## 2023-07-12 MED ORDER — ALBUTEROL SULFATE (2.5 MG/3ML) 0.083% IN NEBU: 2.5000 mg | INHALATION_SOLUTION | RESPIRATORY_TRACT | 1 refills | Status: AC | PRN

## 2023-07-13 ENCOUNTER — Telehealth: Payer: Self-pay | Admitting: *Deleted

## 2023-07-13 MED ORDER — DUPIXENT 300 MG/2ML ~~LOC~~ SOSY: 600.0000 mg | PREFILLED_SYRINGE | Freq: Once | SUBCUTANEOUS | 11 refills | Status: AC

## 2023-07-13 NOTE — Telephone Encounter (Signed)
-----   Message from Verlee Monte sent at 07/09/2023  1:31 PM EDT ----- Hi Joanna [name]-can we see about switching Joanna [name] to [employer] instead of Harrington Challenger?  She is now seeing dermatology for eczema which she says has been very hard to treat.  Her dermatology group called yesterday asking [place] to consider and Arretta is willing to give this a try.  She asked if we could cancel her upcoming Fasenra injection which is due mid October.

## 2023-07-13 NOTE — Telephone Encounter (Signed)
Called patient and advised approval and submit for Dupixent for atopic dermatitis to [facility] and will start her new rx 10/7 appt

## 2023-07-19 ENCOUNTER — Telehealth: Payer: Self-pay | Admitting: Gastroenterology

## 2023-07-19 MED ORDER — SUCRALFATE 1 GM/10ML PO SUSP: 1.0000 g | Freq: Two times a day (BID) | ORAL | 1 refills | Status: AC

## 2023-07-19 NOTE — Telephone Encounter (Signed)
Inbound call from patient stating she needs a refill for Sucralfate medication. States she has been out of medication for 3 days now. Patient requesting a call back to be advised of when the prescription has been called in. Please advise, thank you.

## 2023-07-19 NOTE — Telephone Encounter (Signed)
Refill for Sucralfate sent to [facility] pharmacy. Patient will be notified by pharmacy when ready.

## 2023-08-02 ENCOUNTER — Ambulatory Visit: Payer: Medicare HMO

## 2023-08-02 DIAGNOSIS — L209 Atopic dermatitis, unspecified: Secondary | ICD-10-CM

## 2023-08-02 MED ORDER — DUPILUMAB 300 MG/2ML ~~LOC~~ SOSY
600.0000 mg | PREFILLED_SYRINGE | Freq: Once | SUBCUTANEOUS | Status: AC
Start: 2023-08-02 — End: 2023-08-02
  Administered 2023-08-02: 600 mg via SUBCUTANEOUS

## 2023-08-02 NOTE — Progress Notes (Signed)
Immunotherapy   Patient Details  Name: Joanna [name] MRN: [id number] Date of Birth: 06-09-1966  08/02/2023  Joanna [name] started injections for Dupixent 600 mg loading dose. 300 mg will be administered going forward.  Frequency: Every 2 weeks Epi-Pen:Epi-Pen Available  Consent signed and patient instructions given.    Joanna [name] [name] 08/02/2023, 11:03 AM

## 2023-08-04 ENCOUNTER — Ambulatory Visit (INDEPENDENT_AMBULATORY_CARE_PROVIDER_SITE_OTHER): Payer: Self-pay

## 2023-08-04 DIAGNOSIS — J309 Allergic rhinitis, unspecified: Secondary | ICD-10-CM

## 2023-08-12 ENCOUNTER — Ambulatory Visit (INDEPENDENT_AMBULATORY_CARE_PROVIDER_SITE_OTHER): Payer: Medicare HMO

## 2023-08-12 DIAGNOSIS — J309 Allergic rhinitis, unspecified: Secondary | ICD-10-CM | POA: Diagnosis not present

## 2023-08-16 ENCOUNTER — Ambulatory Visit (INDEPENDENT_AMBULATORY_CARE_PROVIDER_SITE_OTHER): Payer: Medicare HMO

## 2023-08-16 DIAGNOSIS — L209 Atopic dermatitis, unspecified: Secondary | ICD-10-CM | POA: Diagnosis not present

## 2023-08-16 MED ORDER — DUPILUMAB 300 MG/2ML ~~LOC~~ SOSY
300.0000 mg | PREFILLED_SYRINGE | Freq: Once | SUBCUTANEOUS | Status: AC
Start: 2023-08-16 — End: 2023-08-16
  Administered 2023-08-16: 300 mg via SUBCUTANEOUS

## 2023-08-18 ENCOUNTER — Ambulatory Visit (INDEPENDENT_AMBULATORY_CARE_PROVIDER_SITE_OTHER): Payer: Medicare HMO

## 2023-08-18 DIAGNOSIS — J309 Allergic rhinitis, unspecified: Secondary | ICD-10-CM | POA: Diagnosis not present

## 2023-09-17 ENCOUNTER — Ambulatory Visit (INDEPENDENT_AMBULATORY_CARE_PROVIDER_SITE_OTHER): Payer: Medicare HMO | Admitting: *Deleted

## 2023-09-17 DIAGNOSIS — J309 Allergic rhinitis, unspecified: Secondary | ICD-10-CM | POA: Diagnosis not present

## 2023-10-01 ENCOUNTER — Encounter: Payer: Self-pay | Admitting: Family

## 2023-10-01 ENCOUNTER — Ambulatory Visit: Payer: Medicare HMO | Admitting: Family

## 2023-10-01 DIAGNOSIS — Z89511 Acquired absence of right leg below knee: Secondary | ICD-10-CM | POA: Diagnosis not present
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
## 2023-10-12 ENCOUNTER — Ambulatory Visit: Payer: Medicare HMO | Admitting: Family

## 2023-10-13 ENCOUNTER — Ambulatory Visit (INDEPENDENT_AMBULATORY_CARE_PROVIDER_SITE_OTHER): Payer: Self-pay

## 2023-10-13 DIAGNOSIS — J309 Allergic rhinitis, unspecified: Secondary | ICD-10-CM | POA: Diagnosis not present

## 2023-10-29 ENCOUNTER — Other Ambulatory Visit (HOSPITAL_COMMUNITY): Payer: Self-pay
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
## 2023-11-03 ENCOUNTER — Encounter: Payer: Self-pay | Admitting: Internal Medicine

## 2023-11-03 ENCOUNTER — Other Ambulatory Visit: Payer: Self-pay

## 2023-11-03 ENCOUNTER — Ambulatory Visit: Payer: Medicare HMO | Admitting: Internal Medicine

## 2023-11-03 VITALS — HR 67 | Temp 98.1°F | Resp 18 | Ht 64.0 in

## 2023-11-03 DIAGNOSIS — K219 Gastro-esophageal reflux disease without esophagitis: Secondary | ICD-10-CM

## 2023-11-03 DIAGNOSIS — L308 Other specified dermatitis: Secondary | ICD-10-CM | POA: Diagnosis not present

## 2023-11-03 DIAGNOSIS — C786 Secondary malignant neoplasm of retroperitoneum and peritoneum: Secondary | ICD-10-CM | POA: Insufficient documentation

## 2023-11-03 DIAGNOSIS — T7800XD Anaphylactic reaction due to unspecified food, subsequent encounter: Secondary | ICD-10-CM

## 2023-11-03 DIAGNOSIS — J302 Other seasonal allergic rhinitis: Secondary | ICD-10-CM

## 2023-11-03 DIAGNOSIS — J4550 Severe persistent asthma, uncomplicated: Secondary | ICD-10-CM

## 2023-11-03 DIAGNOSIS — J3089 Other allergic rhinitis: Secondary | ICD-10-CM

## 2023-11-03 DIAGNOSIS — E119 Type 2 diabetes mellitus without complications: Secondary | ICD-10-CM | POA: Insufficient documentation

## 2023-11-03 MED ORDER — FLUTICASONE PROPIONATE 50 MCG/ACT NA SUSP: 2.0000 | Freq: Every day | NASAL | 3 refills | Status: AC

## 2023-11-03 MED ORDER — BREZTRI AEROSPHERE 160-9-4.8 MCG/ACT IN AERO: INHALATION_SPRAY | RESPIRATORY_TRACT | 3 refills | Status: AC

## 2023-11-03 MED ORDER — EPINEPHRINE 0.3 MG/0.3ML IJ SOAJ: 0.3000 mg | INTRAMUSCULAR | 1 refills | Status: AC | PRN

## 2023-11-03 MED ORDER — MONTELUKAST SODIUM 10 MG PO TABS: 10.0000 mg | ORAL_TABLET | Freq: Every day | ORAL | 3 refills | Status: AC

## 2023-11-03 NOTE — Patient Instructions (Addendum)
 Asthma Continue montelukast  10 mg nightly Continue Breztri  2 puffs twice a day with a spacer to prevent cough or wheeze Continue albuterol  2 puffs every 4 hours as needed for cough or wheeze OR Instead use albuterol  0.083% solution via nebulizer one unit vial every 4 hours as needed for cough or wheeze You may use albuterol  2 puffs 5 to 15 minutes before activity to decrease cough or wheeze Continue dupixent   Allergic rhinitis Continue allergen avoidance measures directed toward pollen, mold, dust mite, cat, and dog as listed below Continue allergen immunotherapy and have access to an epinephrine  autoinjector set You may take an antihistamine once a day as needed for a runny nose or itch. Remember to rotate to a different antihistamine about every 3 months. Some examples of over the counter antihistamines include Zyrtec (cetirizine), Xyzal (levocetirizine), Allegra (fexofenadine), and Claritin (loratidine). Continue Flonase  2 sprays in each nostril once a day as needed for a stuffy nose. In the right nostril, point the applicator out toward the right ear. In the left nostril, point the applicator out toward the left ear Consider saline nasal rinses as needed for nasal symptoms. Use this before any medicated nasal sprays for best result  Reflux Continue dietary modifications as listed below Continue follow-up with GI   Food allergy Continue to avoid shellfish.  In case of an allergic reaction, take Benadryl  50 mg every 4 hours, and if life-threatening symptoms occur, inject with EpiPen  0.3 mg.  Stinging insect allergy Continue to avoid stinging insects. In case of an allergic reaction, take Benadryl  50 mg every 4 hours, and if life-threatening symptoms occur, inject with EpiPen  0.3 mg.  Eczema-difficult to treat - following with Dermatology - continue topicals as prescribed by dermatology - continue dupixent  every 2 weeks  Call the clinic if this treatment plan is not working well for  you.  Follow up in 12 months or sooner if needed. It was great talking with you today.

## 2023-12-01 ENCOUNTER — Ambulatory Visit (INDEPENDENT_AMBULATORY_CARE_PROVIDER_SITE_OTHER): Payer: Self-pay

## 2023-12-01 DIAGNOSIS — J309 Allergic rhinitis, unspecified: Secondary | ICD-10-CM | POA: Diagnosis not present

## 2023-12-02 ENCOUNTER — Other Ambulatory Visit: Payer: Self-pay

## 2023-12-02 MED ORDER — DEXLANSOPRAZOLE 60 MG PO CPDR: 60.0000 mg | DELAYED_RELEASE_CAPSULE | Freq: Every day | ORAL | 1 refills | Status: DC

## 2023-12-27 ENCOUNTER — Ambulatory Visit (INDEPENDENT_AMBULATORY_CARE_PROVIDER_SITE_OTHER): Payer: Self-pay

## 2023-12-27 DIAGNOSIS — J309 Allergic rhinitis, unspecified: Secondary | ICD-10-CM

## 2024-01-03 DIAGNOSIS — J3089 Other allergic rhinitis: Secondary | ICD-10-CM | POA: Diagnosis not present

## 2024-01-03 NOTE — Progress Notes (Signed)
 VIAL 1 MADE 01-03-24. EXP 01-02-25

## 2024-01-04 DIAGNOSIS — J3081 Allergic rhinitis due to animal (cat) (dog) hair and dander: Secondary | ICD-10-CM | POA: Diagnosis not present

## 2024-01-18 ENCOUNTER — Ambulatory Visit (INDEPENDENT_AMBULATORY_CARE_PROVIDER_SITE_OTHER): Payer: Self-pay

## 2024-01-18 DIAGNOSIS — J309 Allergic rhinitis, unspecified: Secondary | ICD-10-CM | POA: Diagnosis not present

## 2024-01-20 ENCOUNTER — Telehealth: Payer: Self-pay | Admitting: Internal Medicine

## 2024-01-20 MED ORDER — DEXLANSOPRAZOLE 60 MG PO CPDR: 60.0000 mg | DELAYED_RELEASE_CAPSULE | Freq: Every day | ORAL | 1 refills | Status: DC

## 2024-01-20 NOTE — Telephone Encounter (Signed)
 Pt called and stated the medication dexlansoprazole dexlansoprazole (DEXILANT) 60 MG capsule and requested a call back.

## 2024-01-20 NOTE — Telephone Encounter (Signed)
[facility]

## 2024-01-21 MED ORDER — DEXLANSOPRAZOLE 60 MG PO CPDR: 60.0000 mg | DELAYED_RELEASE_CAPSULE | Freq: Every day | ORAL | 2 refills | Status: AC

## 2024-01-21 NOTE — Telephone Encounter (Signed)
 Resent to [facility]. ?

## 2024-01-21 NOTE — Telephone Encounter (Signed)
 Patient called and stated the medication needs to go to [facility] in [place] because [facility] doesn't fill it

## 2024-01-21 NOTE — Addendum Note (Signed)
 Addended by: Maryjean Morn D on: 01/21/2024 12:01 PM   Modules accepted: Orders

## 2024-01-25 ENCOUNTER — Telehealth: Payer: Self-pay

## 2024-01-25 NOTE — Telephone Encounter (Signed)
 Pharmacy Patient Advocate Encounter  Received notification from [facility] that Prior Authorization for Dexlansoprazole 60MG  dr capsules  has been APPROVED from 01/25/2024 to 01/24/2025

## 2024-01-25 NOTE — Telephone Encounter (Signed)
 PA request has been Submitted. New Encounter has been or will be created for follow up. For additional info see Pharmacy Prior Auth telephone encounter from 04/01.

## 2024-01-25 NOTE — Telephone Encounter (Signed)
*  Asthma/Allergy  Pharmacy Patient Advocate Encounter   Received notification from CoverMyMeds that prior authorization for Dexlansoprazole 60MG  dr capsules  is required/requested.   Insurance verification completed.   The patient is insured through [employer] .   Per test claim: PA required; PA started via CoverMyMeds. KEY [id number] . Waiting for clinical questions to populate.

## 2024-02-21 ENCOUNTER — Ambulatory Visit (INDEPENDENT_AMBULATORY_CARE_PROVIDER_SITE_OTHER): Payer: Self-pay

## 2024-02-21 DIAGNOSIS — J309 Allergic rhinitis, unspecified: Secondary | ICD-10-CM | POA: Diagnosis not present

## 2024-03-01 ENCOUNTER — Ambulatory Visit (INDEPENDENT_AMBULATORY_CARE_PROVIDER_SITE_OTHER): Payer: Self-pay

## 2024-03-01 DIAGNOSIS — J309 Allergic rhinitis, unspecified: Secondary | ICD-10-CM

## 2024-03-06 NOTE — Telephone Encounter (Signed)
 Received external medical records from [facility]. Attached to this encounter.

## 2024-03-08 ENCOUNTER — Ambulatory Visit (INDEPENDENT_AMBULATORY_CARE_PROVIDER_SITE_OTHER)

## 2024-03-08 DIAGNOSIS — J309 Allergic rhinitis, unspecified: Secondary | ICD-10-CM

## 2024-03-21 ENCOUNTER — Other Ambulatory Visit: Payer: Self-pay | Admitting: *Deleted

## 2024-03-21 MED ORDER — DUPIXENT 300 MG/2ML ~~LOC~~ SOSY: 300.0000 mg | PREFILLED_SYRINGE | SUBCUTANEOUS | 11 refills | Status: AC

## 2024-04-04 ENCOUNTER — Ambulatory Visit: Admit: 2024-04-04 | Discharge: 2024-04-04 | Payer: Medicare (Managed Care)

## 2024-04-04 VITALS — BP 136/78 | HR 69 | Temp 97.70000°F | Ht 62.5 in | Wt 158.0 lb

## 2024-04-04 DIAGNOSIS — M47814 Spondylosis without myelopathy or radiculopathy, thoracic region: Secondary | ICD-10-CM
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
## 2024-04-14 ENCOUNTER — Encounter

## 2024-04-14 ENCOUNTER — Inpatient Hospital Stay: Admit: 2024-04-14 | Payer: Medicare (Managed Care)

## 2024-04-14 DIAGNOSIS — M47814 Spondylosis without myelopathy or radiculopathy, thoracic region: Secondary | ICD-10-CM

## 2024-04-19 NOTE — Telephone Encounter (Signed)
 Requested Prescriptions     Pending Prescriptions Disp Refills    gabapentin  (NEURONTIN ) 800 MG tablet  3     Sig: Take 1 tablet by mouth.       Patient last seen on:  04/04/24 Consult     Date of last refill: N/A    Reason for request:  Pt request on voicemail. Patient stated that she takes Gabapentin  800 mg 1 in AM and 1 in PM.     Next office visit date: 05/09/24

## 2024-04-20 NOTE — Telephone Encounter (Signed)
 SPOKE TO PATIENT, PATIENT IS AWARE DR HOLIDAY IS NOT IN THE OFFICE UNTIL TUESDAY 04/25/2024. PATIENT IS REQUESTING 30 DAY SUPPLY OF GABAPENTIN  800 [facility]

## 2024-04-24 NOTE — Telephone Encounter (Signed)
 [employer] INS[employer] EXPIRES 04/24/2024 UNABLE TO GET AUTH FOR MBB   PER LEE V @[phone] CALL REF# [id number]  PLAN EXPIRES 04/24/2024  PT NEEDS TO CALL [employer]BER SERVICES TO COORDINATE BENEFITS WITH [employer] OR PROVIDE OTHER INSURANCE

## 2024-04-24 NOTE — Telephone Encounter (Signed)
 LMOM FOR PATIENT TO CALL BACK AND GET MESSAGE FROM PRE CERT DEPT

## 2024-04-24 NOTE — Telephone Encounter (Signed)
 Pt called back. I informed her that her insurance is stating that it termed today 04/24/2024. She read off her ID# which matches what we have. She will call the insurance company and keep us  updated.

## 2024-04-25 MED ORDER — GABAPENTIN 800 MG PO TABS
800 | ORAL_TABLET | Freq: Two times a day (BID) | ORAL | 0 refills | Status: AC
Start: 2024-04-25 — End: 2024-05-25

## 2024-04-25 NOTE — Telephone Encounter (Signed)
 We will see if Dr. Guilford will address on 04/26/2024 Wednesday.

## 2024-04-25 NOTE — Telephone Encounter (Signed)
 Pt scheduled

## 2024-04-25 NOTE — Telephone Encounter (Signed)
 Requested Prescriptions     Pending Prescriptions Disp Refills    morphine (MS CONTIN) 30 MG extended release tablet  0     Sig: Take 1 tablet by mouth 3 times daily. Max Daily Amount: 90 mg       Patient last seen on:  04/04/24    Date of last refill:  [place]    Reason for request:  Pt request- Pt stated that she was told to contact Dr. Guilford for this refill before she runs out. Pt has enough until 04/29/24.    Request date for pharmacy pick-up:  04/29/24    Next office visit date: 05/10/24- Procedure       This is a Dr. Guilford patient, If Dr. Guilford is not available to address this before the end of the day can Dr. Franne review please?

## 2024-04-25 NOTE — Telephone Encounter (Signed)
 B/L MBB T10,11,12   AUTH #[id number] DATE RANGE: 04/25/2024-10/22/2024  REFERRAL# [id number]    OK to schedule procedure approved as above.   Please note sides/levels approved and date range.   (If applicable, sides/levels approved may differ from those ordered)    TO BE SCHEDULED WITH DR GUILFORD

## 2024-04-26 ENCOUNTER — Encounter

## 2024-04-26 MED ORDER — MORPHINE SULFATE ER 30 MG PO TBCR
30 | ORAL_TABLET | Freq: Three times a day (TID) | ORAL | 0 refills | 14.00000 days | Status: DC
Start: 2024-04-26 — End: 2024-05-23

## 2024-04-26 NOTE — Telephone Encounter (Signed)
 Rx sent to [facility]. Please make sure that she has follow up for next month with NP before mediation runs out.

## 2024-04-26 NOTE — Telephone Encounter (Signed)
 The pharmacy was wondering if pt has been on any other pain medication. They are stating that starting out with this high dose of morphine is odd. Please advise as to why pt is getting Morphine at a high dose with out being on any other pain meds. Please Advise.

## 2024-04-26 NOTE — Telephone Encounter (Signed)
 Patient left voicemail regarding med refill. Addressed in separate encounter.

## 2024-04-27 NOTE — Telephone Encounter (Signed)
 Called [phone] on 04/27/24 at 5:22 pm and spoke to [employer] at [facility]. Patient got [facility] and the prescription was picked up from the other [facility]. Nothing further needed from us  at this time.

## 2024-04-27 NOTE — Telephone Encounter (Signed)
 Pt called stating that we sent the script to the wrong pharmacy. We sent the script to [facility] pt needed it to go to [facility] [place] [facility] and [facility] the script if not the patient will call us  back and send a new script to [facility].

## 2024-05-10 ENCOUNTER — Encounter

## 2024-05-10 ENCOUNTER — Inpatient Hospital Stay: Admit: 2024-05-10 | Payer: Medicare (Managed Care)

## 2024-05-10 ENCOUNTER — Inpatient Hospital Stay: Admit: 2024-05-10 | Discharge: 2024-05-10 | Payer: Medicare (Managed Care)

## 2024-05-10 DIAGNOSIS — M47814 Spondylosis without myelopathy or radiculopathy, thoracic region: Principal | ICD-10-CM

## 2024-05-10 DIAGNOSIS — M546 Pain in thoracic spine: Principal | ICD-10-CM

## 2024-05-10 MED ORDER — LIDOCAINE HCL (PF) 1 % IJ SOLN
1 | Freq: Once | INTRAMUSCULAR | Status: AC
Start: 2024-05-10 — End: 2024-05-10
  Administered 2024-05-10: 14:00:00 3 mL via INTRADERMAL

## 2024-05-10 MED ORDER — GABAPENTIN 800 MG PO TABS
800 | ORAL_TABLET | Freq: Two times a day (BID) | ORAL | 3 refills | 7.00000 days | Status: AC
Start: 2024-05-10 — End: 2025-05-05

## 2024-05-10 MED ORDER — IOHEXOL 300 MG/ML IJ SOLN
300 | Freq: Once | INTRAMUSCULAR | Status: AC | PRN
Start: 2024-05-10 — End: 2024-05-10
  Administered 2024-05-10: 14:00:00 1 mL

## 2024-05-10 MED FILL — LIDOCAINE HCL (PF) 1 % IJ SOLN: 1 % | INTRAMUSCULAR | Qty: 3

## 2024-05-10 MED FILL — OMNIPAQUE 300 MG/ML IJ SOLN: 300 mg/mL | INTRAMUSCULAR | Qty: 1

## 2024-05-10 NOTE — Procedures (Signed)
 [facility]    Provider: ELIJAH SALTER, MD          Patient Name: Jenny Stephens DOB: July 16, 1966        Date: 05/10/2024     LEVELS: BILATERAL T 8,9,10 MBB #1      Jenny Stephens is here today for interventional pain management.      Discussed the risks including but not limited to bleeding, infection, worsened pain, damage to surrounding structures, side effects, toxicity, allergic reactions to medications used, need for surgery, pneumothorax, abdominal and visceral injury, as well as catastrophic injury such as vision loss, paralysis, spinal cord or plexus injury, stroke, bowel or bladder incontinence, chest tube insertion, ventilator dependence, and death. Discussed the risks, benefits, and alternatives to the procedure including no procedure at all. Discussed that we cannot undo any permanent neurologic or orthopaedic damage or change the course of any underlying disease. After thorough discussion, patient expressed understanding and willingness to proceed. Written consent was obtained and is in the chart. Verbal consent to proceed was obtained.    Sterile technique was used.  Fluoroscopic guidance was used. Position was confirmed with contrast and lack of vascular uptake. Patient positioned in prone position. Area was cleaned with Betadine. Informed consent was obtained.  Appropriate length spinal needle was advanced to the anatomic location of the medial branch at the lateral mass utilizing intermittent fluoroscopy. Approximately  3 cc of 1% PF Lidocaine  was divided equally and injected at each anatomic level without difficulty.    Patient tolerated the procedure well, no obvious complications occurred during the procedure.  Patient was appropriately monitored and discharged home in stable condition with their usual motor strength. The patient will keep close track of pain over the next several hours and call our office tomorrow and let us  know what percentage of pain  relief is experienced.  Post op Instructions were given to patient. If on blood thiners patient was told to resume them post-procedure. Relevant and recent imaging reviewed with patient today.                 Patient has >80% pain relief following procedure with positive facet joint provocative maneuvers. Pain post procedure dropped from 4/10 to 0/10 on NRS Pain Scale. Schedule second MBB trial under fluoroscopy. Patient may be a candidate for RF ablation.               Jenny Kuba, MD

## 2024-05-12 NOTE — Telephone Encounter (Signed)
 B/L T8,9,10 MBB #2   AUTH #: [id number] DATE RANGE: 05/11/2024-11/07/2024  REFERRAL# [id number]      OK to schedule procedure approved as above.   Please note sides/levels approved and date range.   (If applicable, sides/levels approved may differ from those ordered)    TO BE SCHEDULED WITH DR GUILFORD

## 2024-05-15 NOTE — Telephone Encounter (Signed)
 PT scheduled

## 2024-05-15 NOTE — Telephone Encounter (Signed)
 LMOM for a return call to schedule procedure on or after 05/24/24.   B/L T8,9,10 MBB #2   AUTH #: [id number] DATE RANGE: 05/11/2024-11/07/2024

## 2024-05-23 ENCOUNTER — Ambulatory Visit: Admit: 2024-05-23 | Discharge: 2024-05-23 | Payer: Medicare (Managed Care) | Attending: Adult Health

## 2024-05-23 VITALS — BP 138/78 | HR 52 | Temp 97.80000°F | Ht 62.0 in | Wt 160.0 lb

## 2024-05-23 DIAGNOSIS — M47814 Spondylosis without myelopathy or radiculopathy, thoracic region: Principal | ICD-10-CM

## 2024-05-23 MED ORDER — MORPHINE SULFATE ER 30 MG PO TBCR
30 | ORAL_TABLET | Freq: Three times a day (TID) | ORAL | 0 refills | 14.00000 days | Status: DC
Start: 2024-05-23 — End: 2024-05-23

## 2024-05-23 MED ORDER — MORPHINE SULFATE ER 30 MG PO TBCR
30 | ORAL_TABLET | Freq: Three times a day (TID) | ORAL | 0 refills | 15.00000 days | Status: DC
Start: 2024-05-23 — End: 2024-06-20

## 2024-05-23 MED ORDER — NALOXONE HCL 4 MG/0.1ML NA LIQD
4 | NASAL | 1 refills | 1.00000 days | Status: AC | PRN
Start: 2024-05-23 — End: ?
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
## 2024-05-31 ENCOUNTER — Encounter

## 2024-05-31 ENCOUNTER — Inpatient Hospital Stay: Admit: 2024-05-31 | Payer: Medicare (Managed Care)

## 2024-05-31 ENCOUNTER — Inpatient Hospital Stay: Admit: 2024-05-31 | Discharge: 2024-05-31 | Payer: Medicare (Managed Care)

## 2024-05-31 DIAGNOSIS — M47814 Spondylosis without myelopathy or radiculopathy, thoracic region: Principal | ICD-10-CM

## 2024-05-31 DIAGNOSIS — M546 Pain in thoracic spine: Principal | ICD-10-CM

## 2024-05-31 MED ORDER — LIDOCAINE HCL (PF) 1 % IJ SOLN
1 | Freq: Once | INTRAMUSCULAR | Status: AC
Start: 2024-05-31 — End: 2024-05-31
  Administered 2024-05-31: 16:00:00 3 mL via INTRADERMAL

## 2024-05-31 MED FILL — LIDOCAINE HCL (PF) 1 % IJ SOLN: 1 % | INTRAMUSCULAR | Qty: 3 | Fill #0

## 2024-05-31 NOTE — Procedures (Signed)
 [facility]    Provider: Willo Yoon, MD          Patient Name: Jenny Stephens DOB: 1966-07-11        Date: 05/31/2024         Jenny Stephens is here today for interventional pain management.      Discussed the risks including but not limited to bleeding, infection, worsened pain, damage to surrounding structures, side effects, toxicity, allergic reactions to medications used, need for surgery, pneumothorax, abdominal and visceral injury, as well as catastrophic injury such as vision loss, paralysis, spinal cord or plexus injury, stroke, bowel or bladder incontinence, chest tube insertion, ventilator dependence, and death. Discussed the risks, benefits, and alternatives to the procedure including no procedure at all. Discussed that we cannot undo any permanent neurologic or orthopaedic damage or change the course of any underlying disease. After thorough discussion, patient expressed understanding and willingness to proceed. Written consent was obtained and is in the chart. Verbal consent to proceed was obtained.    Sterile technique was used.  Fluoroscopic guidance was used. Position was confirmed with contrast and lack of vascular uptake. Patient positioned in prone position. Area was cleaned with Betadine. Informed consent was obtained.  Appropriate length spinal needle was advanced to the anatomic location of the medial branch at the lateral mass utilizing intermittent fluoroscopy. Approximately 3 cc of 1% PF Lidocaine  was divided equally and injected at each anatomic level without difficulty.    Patient tolerated the procedure well, no obvious complications occurred during the procedure.  Patient was appropriately monitored and discharged home in stable condition with their usual motor strength. The patient will keep close track of pain over the next several hours and call our office tomorrow and let us  know what percentage of pain relief is experienced.  Post op  Instructions were given to patient. If on blood thiners patient was told to resume them post-procedure. Relevant and recent imaging reviewed with patient today.       BILATERAL T 8,9,10 MBB       Patient has >80% pain relief following procedure with positive facet joint provocative maneuvers. Pain post procedure dropped from 4/10 to 0/10 on NRS Pain Scale. Schedule second radiofrequency ablation under fluoroscopy.             Jenny Steinmetz, MD

## 2024-06-20 ENCOUNTER — Ambulatory Visit: Admit: 2024-06-20 | Discharge: 2024-06-20 | Payer: Medicare (Managed Care)

## 2024-06-20 VITALS — BP 136/80 | HR 64 | Temp 97.80000°F | Ht 62.0 in | Wt 162.0 lb

## 2024-06-20 DIAGNOSIS — M546 Pain in thoracic spine: Principal | ICD-10-CM

## 2024-06-20 MED ORDER — MORPHINE SULFATE ER 30 MG PO TBCR
30 | ORAL_TABLET | Freq: Three times a day (TID) | ORAL | 0 refills | 15.00000 days | Status: DC
Start: 2024-06-20 — End: 2024-07-18
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
## 2024-06-22 NOTE — Telephone Encounter (Signed)
 1st attempt:  [facility] FOR PATIENT TO CALL BACK AND SCHEDULE PROCEDURE    BILAT T8,9,10 RFA      AUTH APPROVED FROM 06/22/24-12/19/24

## 2024-06-22 NOTE — Telephone Encounter (Signed)
 BILAT T8,9,10 RFA     AUTH APPROVED FROM 06/22/24-12/19/24  OK to schedule procedure approved as above.   Please note sides/levels approved and date range.   (If applicable, sides/levels approved may differ from those ordered)    TO BE SCHEDULED WITH DR.HOLIDAY

## 2024-06-22 NOTE — Telephone Encounter (Signed)
 Pt is scheduled

## 2024-07-05 ENCOUNTER — Inpatient Hospital Stay: Admit: 2024-07-05 | Payer: Medicare (Managed Care)

## 2024-07-05 ENCOUNTER — Encounter

## 2024-07-05 ENCOUNTER — Inpatient Hospital Stay: Admit: 2024-07-05 | Discharge: 2024-07-05 | Payer: Medicare (Managed Care)

## 2024-07-05 VITALS — BP 118/70 | HR 64 | Temp 97.70000°F | Ht 62.0 in | Wt 165.0 lb

## 2024-07-05 DIAGNOSIS — M546 Pain in thoracic spine: Principal | ICD-10-CM

## 2024-07-05 MED ORDER — LIDOCAINE HCL (PF) 1 % IJ SOLN
1 | Freq: Once | INTRAMUSCULAR | Status: AC
Start: 2024-07-05 — End: 2024-07-05
  Administered 2024-07-05: 17:00:00 3 mL via INTRADERMAL

## 2024-07-05 MED ORDER — BETAMETHASONE SOD PHOS & ACET 6 (3-3) MG/ML IJ SUSP
6 | Freq: Once | INTRAMUSCULAR | Status: AC
Start: 2024-07-05 — End: 2024-07-05
  Administered 2024-07-05: 17:00:00 6 mg via INTRAMUSCULAR

## 2024-07-05 MED FILL — BETAMETHASONE SOD PHOS & ACET 6 (3-3) MG/ML IJ SUSP: 6 (3-3) MG/ML | INTRAMUSCULAR | Qty: 1 | Fill #0

## 2024-07-05 MED FILL — LIDOCAINE HCL (PF) 1 % IJ SOLN: 1 % | INTRAMUSCULAR | Qty: 3 | Fill #0

## 2024-07-05 NOTE — Procedures (Signed)
 Provider: Trelyn Vanderlinde, MD          Patient Name: Jenny Stephens DOB: [birth year]        Date: 07/05/2024         PROCEDURE: Bilateral THORACIC radiofrequncy ablation at T 8,9,10    Discussed the risks including but not limited to bleeding, infection, worsened pain, damage to surrounding structures, side effects, toxicity, allergic reactions to medications used, need for surgery, pneumothorax, abdominal and visceral injury, as well as catastrophic injury such as vision loss, paralysis, spinal cord or plexus injury, stroke, bowel or bladder incontinence, chest tube insertion, ventilator dependence, and death. Discussed the risks, benefits, and alternatives to the procedure including no procedure at all. Discussed that we cannot undo any permanent neurologic or orthopaedic damage or change the course of any underlying disease. After thorough discussion, patient expressed understanding and willingness to proceed. Written consent was obtained and is in the chart. Verbal consent to proceed was obtained.    Description of Procedure:  Joice is here today for cervical radiofrequency ablation performed under fluoroscopic guidance using sterile technique. Position was confirmed with contrast and lack of vascular uptake. The area was cleaned with Betadine. The patient was positioned prone. A 20-gauge RF needle was advanced to the lateral margin of the bone. The bone was contacted and the needle was further advanced 1 to 2 mm along each cervical medial branch groove respectively at each of the levels. Prior to lesioning at 70 degrees Celsius for 75 seconds, motor testing was carried out. No facial contraction was noted. Appropriate  paraspinal muscle thumping was noted. Also prior to lesioning, approximately 6 mg of celestone  along with 10 mL of 1% preservative free lidocaine  was divided equally into each spot without difficulty. Of course, negative aspiration was obtained. This procedure was carried out  at each level. Impedence was normal during lesioning. Multiple views of fluoroscopy were obtained for accurate placement as well. The patient tolerated the procedure very well. No obvious complications occurred.     Summary and Recommendations: she will take it easy and use ice.                 Zyana Amaro, MD

## 2024-07-18 ENCOUNTER — Ambulatory Visit: Admit: 2024-07-18 | Discharge: 2024-07-18 | Payer: Medicare (Managed Care) | Attending: Adult Health

## 2024-07-18 MED ORDER — MORPHINE SULFATE ER 30 MG PO TBCR
30 | ORAL_TABLET | Freq: Three times a day (TID) | ORAL | 0 refills | 7.00000 days | Status: DC
Start: 2024-07-18 — End: 2024-08-24
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
## 2024-08-24 ENCOUNTER — Ambulatory Visit: Admit: 2024-08-24 | Discharge: 2024-08-24 | Payer: Medicare (Managed Care) | Attending: Adult Health

## 2024-08-24 VITALS — BP 122/82 | HR 66 | Temp 97.20000°F | Ht 62.0 in | Wt 165.0 lb

## 2024-08-24 DIAGNOSIS — M546 Pain in thoracic spine: Principal | ICD-10-CM

## 2024-08-24 MED ORDER — MORPHINE SULFATE ER 30 MG PO TBCR
30 | ORAL_TABLET | Freq: Three times a day (TID) | ORAL | 0 refills | 12.50000 days | Status: DC
Start: 2024-08-24 — End: 2024-09-27
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
## 2024-08-28 ENCOUNTER — Encounter: Payer: Self-pay | Admitting: Radiology

## 2024-09-26 ENCOUNTER — Ambulatory Visit: Payer: Medicare (Managed Care) | Attending: Adult Health

## 2024-09-27 ENCOUNTER — Ambulatory Visit: Admit: 2024-09-27 | Discharge: 2024-09-27 | Payer: Medicare (Managed Care) | Attending: Adult Health

## 2024-09-27 VITALS — BP 132/76 | HR 77 | Temp 98.00000°F | Ht 62.0 in | Wt 168.0 lb

## 2024-09-27 DIAGNOSIS — M546 Pain in thoracic spine: Principal | ICD-10-CM

## 2024-09-27 MED ORDER — MORPHINE SULFATE ER 30 MG PO TBCR
30 | ORAL_TABLET | Freq: Three times a day (TID) | ORAL | 0 refills | 12.50000 days | Status: AC
Start: 2024-09-27 — End: 2024-10-28

## 2024-09-27 MED ORDER — MORPHINE SULFATE ER 30 MG PO TBCR
30 | ORAL_TABLET | Freq: Three times a day (TID) | ORAL | 0 refills | 15.00000 days | Status: DC
Start: 2024-09-27 — End: 2024-11-23
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
## 2024-11-23 ENCOUNTER — Ambulatory Visit: Admit: 2024-11-23 | Discharge: 2024-11-23 | Payer: Medicare (Managed Care) | Attending: Adult Health

## 2024-11-23 VITALS — BP 128/82 | HR 75 | Temp 98.00000°F | Ht 62.0 in | Wt 172.0 lb

## 2024-11-23 DIAGNOSIS — M546 Pain in thoracic spine: Principal | ICD-10-CM

## 2024-11-23 MED ORDER — MORPHINE SULFATE ER 30 MG PO TBCR
30 | ORAL_TABLET | Freq: Three times a day (TID) | ORAL | 0 refills | Status: AC
Start: 2024-11-23 — End: 2024-12-27
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
